# Patient Record
Sex: Female | Born: 1975 | Race: White | Hispanic: No | Marital: Married | Smoking: Never smoker
Health system: Southern US, Community
[De-identification: ages and names within clinical notes are randomized; demographics above are authoritative.]

## PROBLEM LIST (undated history)

## (undated) DIAGNOSIS — I341 Nonrheumatic mitral (valve) prolapse: Secondary | ICD-10-CM

## (undated) DIAGNOSIS — D6800 Von Willebrand disease, unspecified: Secondary | ICD-10-CM

## (undated) DIAGNOSIS — Z8679 Personal history of other diseases of the circulatory system: Secondary | ICD-10-CM

## (undated) DIAGNOSIS — J069 Acute upper respiratory infection, unspecified: Secondary | ICD-10-CM

## (undated) DIAGNOSIS — S8290XA Unspecified fracture of unspecified lower leg, initial encounter for closed fracture: Secondary | ICD-10-CM

## (undated) DIAGNOSIS — K219 Gastro-esophageal reflux disease without esophagitis: Secondary | ICD-10-CM

## (undated) HISTORY — DX: Unspecified fracture of unspecified lower leg, initial encounter for closed fracture: S82.90XA

## (undated) HISTORY — PX: HERNIA REPAIR: SHX51

## (undated) HISTORY — PX: KNEE ARTHROCENTESIS: SUR44

## (undated) HISTORY — PX: TONSILLECTOMY: SUR1361

## (undated) HISTORY — DX: Nonrheumatic mitral (valve) prolapse: I34.1

---

## 2001-05-06 HISTORY — PX: OTHER SURGICAL HISTORY: SHX169

## 2001-05-06 HISTORY — PX: HAND SURGERY: SHX662

## 2001-08-26 ENCOUNTER — Encounter: Payer: Self-pay | Admitting: Gastroenterology

## 2001-08-26 ENCOUNTER — Encounter: Admission: RE | Admit: 2001-08-26 | Discharge: 2001-08-26 | Payer: Self-pay | Admitting: Gastroenterology

## 2002-08-16 ENCOUNTER — Other Ambulatory Visit: Admission: RE | Admit: 2002-08-16 | Discharge: 2002-08-16 | Payer: Self-pay | Admitting: Obstetrics and Gynecology

## 2003-11-08 ENCOUNTER — Other Ambulatory Visit: Admission: RE | Admit: 2003-11-08 | Discharge: 2003-11-08 | Payer: Self-pay | Admitting: Obstetrics and Gynecology

## 2004-11-27 ENCOUNTER — Other Ambulatory Visit: Admission: RE | Admit: 2004-11-27 | Discharge: 2004-11-27 | Payer: Self-pay | Admitting: Obstetrics and Gynecology

## 2005-11-29 ENCOUNTER — Inpatient Hospital Stay (HOSPITAL_COMMUNITY): Admission: AD | Admit: 2005-11-29 | Discharge: 2005-11-29 | Payer: Self-pay | Admitting: Obstetrics and Gynecology

## 2005-12-20 ENCOUNTER — Inpatient Hospital Stay (HOSPITAL_COMMUNITY): Admission: AD | Admit: 2005-12-20 | Discharge: 2005-12-20 | Payer: Self-pay | Admitting: Obstetrics and Gynecology

## 2006-04-18 ENCOUNTER — Ambulatory Visit (HOSPITAL_COMMUNITY): Admission: RE | Admit: 2006-04-18 | Discharge: 2006-04-18 | Payer: Self-pay | Admitting: Obstetrics and Gynecology

## 2006-05-01 ENCOUNTER — Inpatient Hospital Stay (HOSPITAL_COMMUNITY): Admission: AD | Admit: 2006-05-01 | Discharge: 2006-05-02 | Payer: Self-pay | Admitting: Obstetrics and Gynecology

## 2006-05-01 ENCOUNTER — Ambulatory Visit: Payer: Self-pay | Admitting: Internal Medicine

## 2006-05-02 ENCOUNTER — Encounter (HOSPITAL_COMMUNITY): Payer: Self-pay | Admitting: Obstetrics and Gynecology

## 2006-05-02 ENCOUNTER — Encounter: Payer: Self-pay | Admitting: Cardiology

## 2006-05-07 ENCOUNTER — Ambulatory Visit: Payer: Self-pay | Admitting: Cardiology

## 2006-05-16 ENCOUNTER — Ambulatory Visit (HOSPITAL_COMMUNITY): Admission: RE | Admit: 2006-05-16 | Discharge: 2006-05-16 | Payer: Self-pay | Admitting: Obstetrics and Gynecology

## 2006-06-13 ENCOUNTER — Inpatient Hospital Stay (HOSPITAL_COMMUNITY): Admission: AD | Admit: 2006-06-13 | Discharge: 2006-06-13 | Payer: Self-pay | Admitting: Obstetrics and Gynecology

## 2006-06-22 ENCOUNTER — Inpatient Hospital Stay (HOSPITAL_COMMUNITY): Admission: AD | Admit: 2006-06-22 | Discharge: 2006-06-22 | Payer: Self-pay | Admitting: Obstetrics and Gynecology

## 2006-07-04 ENCOUNTER — Inpatient Hospital Stay (HOSPITAL_COMMUNITY): Admission: AD | Admit: 2006-07-04 | Discharge: 2006-07-09 | Payer: Self-pay | Admitting: Obstetrics and Gynecology

## 2006-07-17 ENCOUNTER — Ambulatory Visit: Admission: RE | Admit: 2006-07-17 | Discharge: 2006-07-17 | Payer: Self-pay | Admitting: Obstetrics and Gynecology

## 2006-08-07 ENCOUNTER — Ambulatory Visit: Payer: Self-pay | Admitting: Cardiology

## 2006-08-13 ENCOUNTER — Encounter: Payer: Self-pay | Admitting: Cardiology

## 2006-08-13 ENCOUNTER — Ambulatory Visit: Payer: Self-pay

## 2007-09-02 ENCOUNTER — Ambulatory Visit: Payer: Self-pay | Admitting: Cardiology

## 2008-04-19 ENCOUNTER — Ambulatory Visit: Payer: Self-pay | Admitting: Cardiology

## 2008-05-29 ENCOUNTER — Inpatient Hospital Stay (HOSPITAL_COMMUNITY): Admission: AD | Admit: 2008-05-29 | Discharge: 2008-05-29 | Payer: Self-pay | Admitting: Obstetrics and Gynecology

## 2008-06-01 ENCOUNTER — Inpatient Hospital Stay (HOSPITAL_COMMUNITY): Admission: AD | Admit: 2008-06-01 | Discharge: 2008-06-01 | Payer: Self-pay | Admitting: Obstetrics and Gynecology

## 2008-06-15 ENCOUNTER — Encounter: Payer: Self-pay | Admitting: Physician Assistant

## 2008-06-15 ENCOUNTER — Ambulatory Visit: Payer: Self-pay | Admitting: Cardiology

## 2008-06-15 DIAGNOSIS — I4891 Unspecified atrial fibrillation: Secondary | ICD-10-CM | POA: Insufficient documentation

## 2008-07-29 ENCOUNTER — Inpatient Hospital Stay (HOSPITAL_COMMUNITY): Admission: RE | Admit: 2008-07-29 | Discharge: 2008-07-31 | Payer: Self-pay | Admitting: Obstetrics and Gynecology

## 2009-05-09 ENCOUNTER — Ambulatory Visit: Payer: Self-pay | Admitting: Internal Medicine

## 2009-05-09 DIAGNOSIS — R51 Headache: Secondary | ICD-10-CM | POA: Insufficient documentation

## 2009-05-09 DIAGNOSIS — R5381 Other malaise: Secondary | ICD-10-CM | POA: Insufficient documentation

## 2009-05-09 DIAGNOSIS — R5383 Other fatigue: Secondary | ICD-10-CM | POA: Insufficient documentation

## 2009-05-09 DIAGNOSIS — D692 Other nonthrombocytopenic purpura: Secondary | ICD-10-CM | POA: Insufficient documentation

## 2009-05-09 DIAGNOSIS — R519 Headache, unspecified: Secondary | ICD-10-CM | POA: Insufficient documentation

## 2009-05-09 LAB — CONVERTED CEMR LAB
ALT: 14 units/L (ref 0–35)
Alkaline Phosphatase: 58 units/L (ref 39–117)
BUN: 15 mg/dL (ref 6–23)
Bilirubin Urine: NEGATIVE
CO2: 28 meq/L (ref 19–32)
Creatinine, Ser: 0.7 mg/dL (ref 0.4–1.2)
Eosinophils Absolute: 0 10*3/uL (ref 0.0–0.7)
Eosinophils Relative: 0.9 % (ref 0.0–5.0)
Folate: 17.7 ng/mL
GFR calc non Af Amer: 103.61 mL/min (ref >60)
Glucose, Bld: 76 mg/dL (ref 70–99)
Hemoglobin, Urine: NEGATIVE
INR: 1 (ref 0.8–1.0)
Leukocytes, UA: NEGATIVE
MCHC: 33.2 g/dL (ref 30.0–36.0)
Mono Screen: NEGATIVE
Monocytes Absolute: 0.4 10*3/uL (ref 0.1–1.0)
Monocytes Relative: 9.3 % (ref 3.0–12.0)
Nitrite: NEGATIVE
RDW: 12.3 % (ref 11.5–14.6)
Saturation Ratios: 28.6 % (ref 20.0–50.0)
Sodium: 140 meq/L (ref 135–145)
TSH: 1.09 microintl units/mL (ref 0.35–5.50)
Transferrin: 289.3 mg/dL (ref 212.0–360.0)
Urine Glucose: NEGATIVE mg/dL
Urobilinogen, UA: 0.2 (ref 0.0–1.0)
WBC: 4.4 10*3/uL — ABNORMAL LOW (ref 4.5–10.5)
aPTT: 38.3 s — ABNORMAL HIGH (ref 21.7–28.8)

## 2009-05-10 ENCOUNTER — Encounter: Payer: Self-pay | Admitting: Internal Medicine

## 2009-05-12 ENCOUNTER — Telehealth: Payer: Self-pay | Admitting: Internal Medicine

## 2009-05-22 ENCOUNTER — Ambulatory Visit: Payer: Self-pay | Admitting: Internal Medicine

## 2009-05-22 DIAGNOSIS — R791 Abnormal coagulation profile: Secondary | ICD-10-CM | POA: Insufficient documentation

## 2009-05-22 DIAGNOSIS — D892 Hypergammaglobulinemia, unspecified: Secondary | ICD-10-CM | POA: Insufficient documentation

## 2009-05-23 ENCOUNTER — Ambulatory Visit: Payer: Self-pay | Admitting: Hematology and Oncology

## 2009-05-25 ENCOUNTER — Encounter: Payer: Self-pay | Admitting: Internal Medicine

## 2009-05-25 LAB — CBC WITH DIFFERENTIAL/PLATELET
Basophils Absolute: 0 10*3/uL (ref 0.0–0.1)
Eosinophils Absolute: 0 10*3/uL (ref 0.0–0.5)
MCHC: 34.1 g/dL (ref 31.5–36.0)
RBC: 4.76 10*6/uL (ref 3.70–5.45)
nRBC: 0 % (ref 0–0)

## 2009-05-25 LAB — HCG, QUANTITATIVE, PREGNANCY: hCG, Beta Chain, Quant, S: <2 m[IU]/mL

## 2009-05-25 LAB — IVY BLEEDING TIME: Bleeding Time: 10.5 Minutes — ABNORMAL HIGH (ref 2.0–8.0)

## 2009-05-29 LAB — COMPREHENSIVE METABOLIC PANEL
Alkaline Phosphatase: 52 U/L (ref 39–117)
BUN: 18 mg/dL (ref 6–23)
CO2: 23 mEq/L (ref 19–32)
Chloride: 106 mEq/L (ref 96–112)
Creatinine, Ser: 0.7 mg/dL (ref 0.40–1.20)
Potassium: 3.9 mEq/L (ref 3.5–5.3)
Sodium: 141 mEq/L (ref 135–145)
Total Protein: 7.9 g/dL (ref 6.0–8.3)

## 2009-05-29 LAB — MIXING STUDY DILUTIONS, PTT
Patient 1/1 Immediate Mix: 36 seconds
Patient 4/1 Immediate Mix: 43 seconds

## 2009-05-29 LAB — PROTHROMBIN TIME
INR: 1.23 (ref <1.50)
Prothrombin Time: 15.4 seconds — ABNORMAL HIGH (ref 11.6–15.2)

## 2009-05-29 LAB — THROMBIN TIME: THROMBIN TIME: 20 s (ref 16–23)

## 2009-05-30 LAB — VON WILLEBRAND FACTOR MULTIMER
Factor-VIII Activity: 43 % — ABNORMAL LOW (ref 50–180)
Ristocetin Co-Factor: 43 % (ref 42–200)
Von Willebrand Factor Ag: 50 % (ref 50–217)

## 2009-06-08 ENCOUNTER — Ambulatory Visit: Payer: Self-pay | Admitting: Internal Medicine

## 2009-06-08 DIAGNOSIS — H66009 Acute suppurative otitis media without spontaneous rupture of ear drum, unspecified ear: Secondary | ICD-10-CM | POA: Insufficient documentation

## 2009-06-08 DIAGNOSIS — K5289 Other specified noninfective gastroenteritis and colitis: Secondary | ICD-10-CM | POA: Insufficient documentation

## 2009-06-09 ENCOUNTER — Encounter: Payer: Self-pay | Admitting: Internal Medicine

## 2009-11-20 ENCOUNTER — Telehealth: Payer: Self-pay | Admitting: Internal Medicine

## 2009-11-20 DIAGNOSIS — L272 Dermatitis due to ingested food: Secondary | ICD-10-CM | POA: Insufficient documentation

## 2009-12-04 ENCOUNTER — Encounter: Payer: Self-pay | Admitting: Internal Medicine

## 2010-04-11 ENCOUNTER — Telehealth: Payer: Self-pay | Admitting: Internal Medicine

## 2010-04-11 ENCOUNTER — Ambulatory Visit: Payer: Self-pay | Admitting: Internal Medicine

## 2010-04-11 DIAGNOSIS — R05 Cough: Secondary | ICD-10-CM

## 2010-04-11 DIAGNOSIS — R059 Cough, unspecified: Secondary | ICD-10-CM | POA: Insufficient documentation

## 2010-04-18 ENCOUNTER — Ambulatory Visit: Payer: Self-pay | Admitting: Internal Medicine

## 2010-06-05 NOTE — Assessment & Plan Note (Signed)
Summary: CHEST CONGEST--COUGH  --STC   Vital Signs:  Patient profile:   35 year old female Menstrual status:  regular LMP:     04/06/2010 Height:      61 inches Weight:      113.50 pounds BMI:     21.52 O2 Sat:      88 % on Room air Temp:     98.4 degrees F oral Pulse rate:   102 / minute Pulse rhythm:   regular Resp:     16 per minute BP sitting:   110 / 68  (left arm) Cuff size:   regular  Vitals Entered By: Rock Nephew CMA (April 11, 2010 10:05 AM)  O2 Flow:  Room air CC: Patient c/o cough, congestion, SOB, headache x 1wk, URI symptoms Is Patient Diabetic? No Pain Assessment Patient in pain? no       Does patient need assistance? Functional Status Self care Ambulation Normal LMP (date): 04/06/2010     Menstrual Status regular Enter LMP: 04/06/2010 Last PAP Result Normal   Primary Care Provider:  Etta Grandchild MD  CC:  Patient c/o cough, congestion, SOB, headache x 1wk, and URI symptoms.  History of Present Illness:  URI Symptoms      This is a 35 year old woman who presents with URI symptoms.  The symptoms began 1 week ago.  The severity is described as moderate.  The patient reports nasal congestion, sore throat, productive cough, and sick contacts, but denies clear nasal discharge, purulent nasal discharge, and earache.  Associated symptoms include dyspnea.  The patient denies fever, stiff neck, wheezing, rash, vomiting, diarrhea, use of an antipyretic, and response to antipyretic.  The patient also reports muscle aches and severe fatigue.  The patient denies itchy throat, sneezing, seasonal symptoms, and headache.  Risk factors for Strep sinusitis include double sickening.  The patient denies the following risk factors for Strep sinusitis: unilateral facial pain, unilateral nasal discharge, poor response to decongestant, tooth pain, Strep exposure, tender adenopathy, and absence of cough.    Preventive Screening-Counseling &  Management  Alcohol-Tobacco     Alcohol drinks/day: 0     Alcohol Counseling: not indicated; patient does not drink     Smoking Status: never     Tobacco Counseling: not indicated; no tobacco use  Hep-HIV-STD-Contraception     Hepatitis Risk: no risk noted     HIV Risk: no risk noted     STD Risk: no risk noted      Sexual History:  currently monogamous.        Drug Use:  never.        Blood Transfusions:  no.    Medications Prior to Update: 1)  Prenatal Vitamins .... Take As Directed 2)  Calcium 1500 Mg Tabs (Calcium Carbonate) .... Take 1 Tablet By Mouth Once A Day 3)  Concept Dha 53.5-38-1 Mg Caps (Prenat-Fefum-Fepo-Fa-Omega 3) 4)  Ortho-Micro (28) 5)  Promethazine Hcl 25 Mg Tabs (Promethazine Hcl) .... 1/2-1 By Mouth Three Times A Day As Needed For Nausea/vomiting 6)  Lortab 5 5-500 Mg Tabs (Hydrocodone-Acetaminophen) .... One By Mouth Three Times A Day As Needed For Pain  Current Medications (verified): 1)  Calcium 1500 Mg Tabs (Calcium Carbonate) .... Take 1 Tablet By Mouth Once A Day 2)  Multivitamins  Tabs (Multiple Vitamin) .... Take 1 Tablet By Mouth Once A Day 3)  Avelox 400 Mg Tabs (Moxifloxacin Hcl) .... One By Mouth Once Daily For 7 Days 4)  Mytussin Ac 100-10 Mg/13ml Syrp (Guaifenesin-Codeine) .... 5-10 Ml By Mouth Qid As Needed For Cough  Allergies (verified): 1)  ! Compazine 2)  ! 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  Alcohol Use - no Regular exercise-yes  Risk Factors: Alcohol Use: 0  (04/11/2010) Exercise: yes (05/09/2009)  Risk Factors: Smoking Status: never (04/11/2010)  Family History: Reviewed history from 06/15/2008 and no changes required. Father:HTN, had cath that was normal Mother:Palpitations  Social History: Reviewed history from 05/09/2009 and no changes required. Full Time Mom Married  Tobacco Use - No.  Alcohol Use - no Regular exercise-yes  Review of Systems  The patient denies anorexia, fever, weight loss, weight gain, hoarseness, chest pain, syncope, dyspnea on exertion, peripheral edema, hemoptysis, abdominal pain, suspicious skin lesions, and enlarged lymph nodes.   CV:  Denies chest pain or discomfort, fainting, fatigue, leg cramps with exertion, lightheadness, near fainting, palpitations, shortness of breath with exertion, swelling of feet, and weight gain.  Physical Exam  General:  alert, well-nourished, well-hydrated, appropriate dress, healthy-appearing, cooperative to examination, and disheveled.   Head:  normocephalic, atraumatic, no abnormalities observed, and no abnormalities palpated.   Eyes:  vision grossly intact, pupils equal, and no injection.   Ears:  R ear normal and L ear normal.   Nose:  no external deformity, no nasal discharge, no airflow obstruction, no intranasal foreign body, no nasal polyps, no nasal mucosal lesions, no mucosal friability, no active bleeding or clots, no sinus percussion tenderness, no septum abnormalities, mucosal erythema, and mucosal edema.   Mouth:  good dentition, no exudates, no posterior lymphoid hypertrophy, no postnasal drip, no pharyngeal crowing, no lesions, no aphthous ulcers, no erosions, no tongue abnormalities, no leukoplakia, no petechiae, and pharyngeal erythema.   Neck:  supple, full ROM, no masses, no thyromegaly, no thyroid nodules or tenderness, normal carotid upstroke, no carotid bruits, no cervical lymphadenopathy, and no neck tenderness.   Lungs:  normal respiratory effort, no  intercostal retractions, no accessory muscle use, no dullness, no fremitus, no crackles, and no wheezes.   Heart:  Normal rate and regular rhythm. S1 and S2 normal without gallop, murmur, click, rub or other extra sounds. Abdomen:  soft, non-tender, normal bowel sounds, no distention, no masses, no guarding, no rigidity, no rebound tenderness, and no abdominal hernia.   Msk:  No deformity or scoliosis noted of thoracic or lumbar spine.   Pulses:  R and L carotid,radial,femoral,dorsalis pedis and posterior tibial pulses are full and equal bilaterally Extremities:  No clubbing, cyanosis, edema, or deformity noted with normal full range of motion of all joints.   Neurologic:  No cranial nerve deficits noted. Station and gait are normal. Plantar reflexes are down-going bilaterally. DTRs are symmetrical throughout. Sensory, motor and coordinative functions appear intact. Skin:  Intact without suspicious lesions or rashes Cervical Nodes:  no anterior cervical adenopathy and no posterior cervical adenopathy.   Axillary Nodes:  no R axillary adenopathy and no L axillary adenopathy.   Psych:  Cognition and judgment appear intact. Alert and cooperative with normal attention span and concentration. No apparent delusions, illusions, hallucinations   Impression &  Recommendations:  Problem # 1:  COUGH (ICD-786.2) Assessment New will check for pna Orders: T-2 View CXR (71020TC)  Problem # 2:  BRONCHITIS-ACUTE (ICD-466.0) Assessment: New  Her updated medication list for this problem includes:    Avelox 400 Mg Tabs (Moxifloxacin hcl) ..... One by mouth once daily for 7 days    Mytussin Ac 100-10 Mg/9ml Syrp (Guaifenesin-codeine) .Marland Kitchen... 5-10 ml by mouth qid as needed for cough  Problem # 3:  ATRIAL FIBRILLATION (ICD-427.31) Assessment: Improved  Complete Medication List: 1)  Calcium 1500 Mg Tabs (Calcium carbonate) .... Take 1 tablet by mouth once a day 2)  Multivitamins Tabs (Multiple vitamin) ....  Take 1 tablet by mouth once a day 3)  Avelox 400 Mg Tabs (Moxifloxacin hcl) .... One by mouth once daily for 7 days 4)  Mytussin Ac 100-10 Mg/16ml Syrp (Guaifenesin-codeine) .... 5-10 ml by mouth qid as needed for cough  Patient Instructions: 1)  Please schedule a follow-up appointment in 1-2 weeks. 2)  Take your antibiotic as prescribed until ALL of it is gone, but stop if you develop a rash or swelling and contact our office as soon as possible. 3)  Acute bronchitis symptoms for less than 10 days are not helped by antibiotics. take over the counter cough medications. call if no improvment in  5-7 days, sooner if increasing cough, fever, or new symptoms( shortness of breath, chest pain). Prescriptions: MYTUSSIN AC 100-10 MG/5ML SYRP (GUAIFENESIN-CODEINE) 5-10 ml by mouth qid as needed FOR COUGH  #8 OUNCES x 1   Entered and Authorized by:   Etta Grandchild MD   Signed by:   Etta Grandchild MD on 04/11/2010   Method used:   Print then Give to Patient   RxID:   [id number] AVELOX 400 MG TABS (MOXIFLOXACIN HCL) One by mouth once daily for 7 days  #7 x 0   Entered and Authorized by:   Etta Grandchild MD   Signed by:   Etta Grandchild MD on 04/11/2010   Method used:   Samples Given   RxID:   [id number]    Orders Added: 1)  T-2 View CXR [71020TC] 2)  Est. Patient Level IV [47425]

## 2010-06-05 NOTE — Assessment & Plan Note (Signed)
Summary: 2 wk f/u / #/ cd   Vital Signs:  Patient profile:   35 year old female Menstrual status:  irregular Height:      61 inches Weight:      117 pounds BMI:     22.19 O2 Sat:      98 % on Room air Temp:     97.3 degrees F oral Pulse rate:   67 / minute Pulse rhythm:   regular Resp:     16 per minute BP sitting:   112 / 66  (left arm) Cuff size:   large  Vitals Entered By: Rock Nephew CMA (May 22, 2009 2:09 PM)  O2 Flow:  Room air CC: follow-up visit Is Patient Diabetic? No Pain Assessment Patient in pain? no        Primary Care Provider:  Etta Grandchild MD  CC:  follow-up visit.  History of Present Illness: she returns for f/up and is concerned about abnormal labs. she still reports "bruising all the time" but does not have any bruising to show me today. she now reoprts that she did a bleeding time 10 years ago  b/c she was vomiting blood and she has a hx. of nosebleeds. also, she had a recent c-section and had delayed vaginal bleeding.  Preventive Screening-Counseling & Management  Alcohol-Tobacco     Alcohol drinks/day: 0     Smoking Status: never  Clinical Review Panels:  Diabetes Management   Creatinine:  0.7 (05/09/2009)  CBC   WBC:  4.4 (05/09/2009)   RBC:  4.89 (05/09/2009)   Hgb:  14.6 (05/09/2009)   Hct:  44.1 (05/09/2009)   Platelets:  204.0 (05/09/2009)   MCV  90.2 (05/09/2009)   MCHC  33.2 (05/09/2009)   RDW  12.3 (05/09/2009)   PMN:  45.9 (05/09/2009)   Lymphs:  43.4 (05/09/2009)   Monos:  9.3 (05/09/2009)   Eosinophils:  0.9 (05/09/2009)   Basophil:  0.5 (05/09/2009)  Complete Metabolic Panel   Glucose:  76 (05/09/2009)   Sodium:  140 (05/09/2009)   Potassium:  4.6 (05/09/2009)   Chloride:  103 (05/09/2009)   CO2:  28 (05/09/2009)   BUN:  15 (05/09/2009)   Creatinine:  0.7 (05/09/2009)   Albumin:  4.6 (05/09/2009)   Total Protein:  8.8 (05/09/2009)   Calcium:  10.4 (05/09/2009)   Total Bili:  2.7 (05/09/2009)   Alk  Phos:  58 (05/09/2009)   SGPT (ALT):  14 (05/09/2009)   SGOT (AST):  18 (05/09/2009)   Medications Prior to Update: 1)  Prenatal Vitamins .... Take As Directed 2)  Calcium 1500 Mg Tabs (Calcium Carbonate) .... Take 1 Tablet By Mouth Once A Day 3)  Concept Dha 53.5-38-1 Mg Caps (Prenat-Fefum-Fepo-Fa-Omega 3) 4)  Ortho-Micro (28)  Current Medications (verified): 1)  Prenatal Vitamins .... Take As Directed 2)  Calcium 1500 Mg Tabs (Calcium Carbonate) .... Take 1 Tablet By Mouth Once A Day 3)  Concept Dha 53.5-38-1 Mg Caps (Prenat-Fefum-Fepo-Fa-Omega 3) 4)  Ortho-Micro (28)  Allergies (verified): 1)  ! Compazine 2)  ! Sulfa  Past History:  Past Medical History: Reviewed history from 05/09/2009 and no changes required. 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 Tonsillectomy  Family History: Reviewed history from 06/15/2008 and no changes required. Father:HTN, had cath that was normal Mother:Palpitations  Social History: Reviewed  history from 05/09/2009 and no changes required. Full Time Mom Married  Tobacco Use - No.  Alcohol Use - no Regular exercise-yes  Review of Systems  The patient denies anorexia, fever, weight loss, chest pain, hemoptysis, abdominal pain, abnormal bleeding, enlarged lymph nodes, and angioedema.   General:  Denies chills, fatigue, fever, loss of appetite, malaise, sleep disorder, sweats, weakness, and weight loss. Heme:  Complains of abnormal bruising; denies bleeding, enlarge lymph nodes, fevers, pallor, and skin discoloration.  Physical Exam  General:  Well-developed,well-nourished,in no acute distress; alert,appropriate and cooperative throughout examination Head:  Normocephalic and atraumatic without obvious abnormalities. No  apparent alopecia or balding. Mouth:  Oral mucosa and oropharynx without lesions or exudates.  Teeth in good repair. Neck:  No deformities, masses, or tenderness noted. Lungs:  Normal respiratory effort, chest expands symmetrically. Lungs are clear to auscultation, no crackles or wheezes. Heart:  Normal rate and regular rhythm. S1 and S2 normal without gallop, murmur, click, rub or other extra sounds. Abdomen:  Bowel sounds positive,abdomen soft and non-tender without masses, organomegaly or hernias noted. Msk:  No deformity or scoliosis noted of thoracic or lumbar spine.   Pulses:  R and L carotid,radial,femoral,dorsalis pedis and posterior tibial pulses are full and equal bilaterally Extremities:  No clubbing, cyanosis, edema, or deformity noted with normal full range of motion of all joints.   Neurologic:  No cranial nerve deficits noted. Station and gait are normal. Plantar reflexes are down-going bilaterally. DTRs are symmetrical throughout. Sensory, motor and coordinative functions appear intact. Skin:  turgor normal, color normal, no rashes, no suspicious lesions, no ecchymoses, no petechiae, no purpura, no ulcerations, and no edema.   Cervical Nodes:  no anterior cervical adenopathy and no posterior cervical adenopathy.   Axillary Nodes:  no R axillary adenopathy and no L axillary adenopathy.   Inguinal Nodes:  no R inguinal adenopathy and no L inguinal adenopathy.   Psych:  Cognition and judgment appear intact. Alert and cooperative with normal attention span and concentration. 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 Take  as directed 2)  Calcium 1500 Mg Tabs (Calcium carbonate) .... Take 1 tablet by mouth once a day 3)  Concept Dha 53.5-38-1 Mg Caps (Prenat-fefum-fepo-fa-omega 3) 4)  Ortho-micro (28)   Patient Instructions: 1)  Please schedule a follow-up appointment in 2 months.

## 2010-06-05 NOTE — Progress Notes (Signed)
Summary: RESULTS  Phone Note Call from Patient Call back at Home Phone [phone]   Summary of Call: Patient is requesting results of xray.  Initial call taken by: Lamar Sprinkles, CMA,  April 11, 2010 3:23 PM  Follow-up for Phone Call        normal Follow-up by: Etta Grandchild MD,  April 11, 2010 3:27 PM  Additional Follow-up for Phone Call Additional follow up Details #1::        Pt informed  Additional Follow-up by: Lamar Sprinkles, CMA,  April 11, 2010 5:41 PM

## 2010-06-05 NOTE — Assessment & Plan Note (Signed)
Summary: NEW PT-ETNA NAP-#PKG-LB   Vital Signs:  Patient profile:   35 year old female Menstrual status:  irregular LMP:     10/24/2007 Height:      61 inches Weight:      117 pounds BMI:     22.19 O2 Sat:      90 % on Room air Temp:     98.0 degrees F oral Pulse rate:   61 / minute Pulse rhythm:   regular Resp:     16 per minute BP sitting:   106 / 70  (left arm) Cuff size:   large  Vitals Entered By: Rock Nephew CMA (May 09, 2009 10:19 AM)  O2 Flow:  Room air CC: new pt cpx, Preventive Care Is Patient Diabetic? No Pain Assessment Patient in pain? no      LMP (date): 10/24/2007     Menstrual Status irregular Enter LMP: 10/24/2007 Last PAP Result Normal   Primary Care Provider:  Etta Grandchild MD  CC:  new pt cpx and Preventive Care.  History of Present Illness: New to me she c/o's several month hx. of dizziness, diffuse tingling/numbness, fatigue, easy bruising, and frequent sore throat and "congestion".  Preventive Screening-Counseling & Management  Alcohol-Tobacco     Alcohol drinks/day: 0     Smoking Status: never  Caffeine-Diet-Exercise     Does Patient Exercise: yes  Hep-HIV-STD-Contraception     Hepatitis Risk: no risk noted     HIV Risk: no risk noted     STD Risk: no risk noted      Sexual History:  currently monogamous.        Drug Use:  never.        Blood Transfusions:  no.    Current Medications (verified): 1)  Prenatal Vitamins .... Take As Directed 2)  Calcium 1500 Mg Tabs (Calcium Carbonate) .... Take 1 Tablet By Mouth Once A Day 3)  Concept Dha 53.5-38-1 Mg Caps (Prenat-Fefum-Fepo-Fa-Omega 3) 4)  Ortho-Micro (28)  Allergies (verified): 1)  ! Compazine 2)  ! 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 Tonsillectomy  Family History: Reviewed history from 06/15/2008 and no changes required. Father:HTN, had cath that was normal Mother:Palpitations  Social History: Reviewed history from 06/15/2008 and no changes required. Full Time Mom Married  Tobacco Use - No.  Alcohol Use - no Regular exercise-yes Hepatitis Risk:  no risk noted HIV Risk:  no risk noted STD Risk:  no risk noted Sexual History:  currently monogamous Drug Use:  never Blood Transfusions:  no Does Patient Exercise:  yes  Review of Systems  The patient denies anorexia, fever, weight loss, weight gain, chest pain, peripheral edema, prolonged cough, hemoptysis, abdominal pain, hematuria, difficulty walking, depression, abnormal bleeding, enlarged lymph nodes, and angioedema.   General:  Complains of fatigue; denies chills, fever, loss of appetite, malaise, sleep disorder, sweats, weakness, and weight loss. ENT:  Complains of nasal congestion and sore throat; denies decreased hearing, difficulty swallowing, earache, nosebleeds, postnasal drainage, ringing in ears, and sinus pressure. CV:  Denies chest pain or discomfort, difficulty breathing at night, fainting, near fainting, palpitations, shortness of breath with exertion, swelling of feet, and weight gain. Psych:  Denies anxiety, depression, easily angered, easily tearful, irritability, mental problems, panic attacks, sense  of great danger, and suicidal thoughts/plans. Heme:  Complains of abnormal bruising; denies bleeding, enlarge lymph nodes, fevers, pallor, and skin discoloration.  Physical Exam  General:  Well-developed,well-nourished,in no acute distress; alert,appropriate and cooperative throughout examination Head:  Normocephalic and atraumatic without obvious abnormalities. No apparent alopecia or balding. Eyes:  No corneal or conjunctival inflammation noted. EOMI. Perrla. Funduscopic exam benign, without hemorrhages,  exudates or papilledema. Vision grossly normal. Ears:  R ear normal and L ear normal.   Mouth:  Oral mucosa and oropharynx without lesions or exudates.  Teeth in good repair. Neck:  No deformities, masses, or tenderness noted. Lungs:  Normal respiratory effort, chest expands symmetrically. Lungs are clear to auscultation, no crackles or wheezes. Heart:  Normal rate and regular rhythm. S1 and S2 normal without gallop, murmur, click, rub or other extra sounds. Abdomen:  Bowel sounds positive,abdomen soft and non-tender without masses, organomegaly or hernias noted. Msk:  No deformity or scoliosis noted of thoracic or lumbar spine.   Pulses:  R and L carotid,radial,femoral,dorsalis pedis and posterior tibial pulses are full and equal bilaterally Extremities:  No clubbing, cyanosis, edema, or deformity noted with normal full range of motion of all joints.   Neurologic:  No cranial nerve deficits noted. Station and gait are normal. Plantar reflexes are down-going bilaterally. DTRs are symmetrical throughout. Sensory, motor and coordinative functions appear intact. Skin:  turgor normal, color normal, no rashes, no suspicious lesions, no ecchymoses, no petechiae, no purpura, no ulcerations, and no edema.   Cervical Nodes:  no anterior cervical adenopathy and no posterior cervical adenopathy.   Axillary Nodes:  no R axillary adenopathy and no L axillary adenopathy.   Inguinal Nodes:  no R inguinal adenopathy and no L inguinal adenopathy.   Psych:  Cognition and judgment appear intact. Alert and cooperative with normal attention span and concentration. 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) Assessment New  Orders: Venipuncture (45409) TLB-B12 + Folate Pnl (81191_47829-F62/ZHY) TLB-IBC Pnl (Iron/FE;Transferrin) (83550-IBC) TLB-BMP (Basic Metabolic Panel-BMET) (80048-METABOL) TLB-CBC Platelet -  w/Differential (85025-CBCD) TLB-Hepatic/Liver Function Pnl (80076-HEPATIC) TLB-TSH (Thyroid Stimulating Hormone) (84443-TSH) TLB-Udip w/ Micro (81001-URINE) TLB-Mono Test (Monospot) (86308-MONO) TLB-PT (Protime) (85610-PTP) TLB-PTT (85730-PTTL)  Problem # 2:  FATIGUE, ACUTE (ICD-780.79) Assessment: New  Orders: Venipuncture (86578) TLB-B12 + Folate Pnl ([id number]-X32/GMW) TLB-IBC Pnl (Iron/FE;Transferrin) (83550-IBC) TLB-BMP (Basic Metabolic Panel-BMET) (80048-METABOL) TLB-CBC Platelet - w/Differential (85025-CBCD) TLB-Hepatic/Liver Function Pnl (80076-HEPATIC) TLB-TSH (Thyroid Stimulating Hormone) (84443-TSH) TLB-Udip w/ Micro (81001-URINE) TLB-Mono Test (Monospot) (86308-MONO) TLB-PT (Protime) (85610-PTP) TLB-PTT (85730-PTTL)  Problem # 3:  ATRIAL FIBRILLATION (ICD-427.31) Assessment: Improved  The following medications were removed from the medication list:    Labetalol Hcl 100 Mg Tabs (Labetalol hcl) .Marland Kitchen... As needed  Orders: Venipuncture (10272) TLB-B12 + Folate Pnl ([id number]-Q25/ZDG) TLB-IBC Pnl (Iron/FE;Transferrin) (83550-IBC) TLB-BMP (Basic Metabolic Panel-BMET) (80048-METABOL) TLB-CBC Platelet - w/Differential (85025-CBCD) TLB-Hepatic/Liver Function Pnl (80076-HEPATIC) TLB-TSH (Thyroid Stimulating Hormone) (84443-TSH) TLB-Udip w/ Micro (81001-URINE) TLB-Mono Test (Monospot) (86308-MONO) TLB-PT (Protime) (85610-PTP) TLB-PTT (85730-PTTL)  Complete Medication List: 1)  Prenatal Vitamins  .... Take as directed 2)  Calcium 1500 Mg Tabs (Calcium carbonate) .... Take 1 tablet by mouth once a day 3)  Concept Dha 53.5-38-1 Mg Caps (Prenat-fefum-fepo-fa-omega 3) 4)  Ortho-micro (28)   PAP Screening:    Hx Cervical Dysplasia in last 5 yrs? Yes    3 normal PAP smears in last 5 yrs? Yes    Last PAP smear:  09/29/2008  PAP Smear Results:  Date of Exam:  09/29/2008    Results:  Normal  Osteoporosis Risk Assessment:  Risk Factors for Fracture or Low Bone  Density:   Race (White or Asian):     yes   Smoking status:       never  Immunization & Chemoprophylaxis:    Tetanus vaccine: Tdap  (05/06/2008)  Patient Instructions: 1)  Please schedule a follow-up appointment in 2 weeks.  Preventive Care Screening  Last Tetanus Booster:    Date:  05/06/2008    Results:  Tdap

## 2010-06-05 NOTE — Letter (Signed)
[facility]   Imported By: Lester  06/22/2009 07:42:00  _____________________________________________________________________  External Attachment:    Type:   Image     Comment:   External Document
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
## 2010-06-05 NOTE — Progress Notes (Signed)
Summary: REFERRAL  Phone Note Call from Patient Call back at Home Phone [phone]   Summary of Call: Pt c/o allergic reaction to "something" she ate last week. Patient is requesting referral to allergist for eval.  Initial call taken by: Lamar Sprinkles, CMA,  November 20, 2009 3:13 PM  New Problems: ALLERGY, FOOD (ICD-693.1)   New Problems: ALLERGY, FOOD (ICD-693.1)

## 2010-06-05 NOTE — Progress Notes (Signed)
Summary: CALL  Phone Note Call from Patient   Summary of Call: Patient is requesting a call back regarding the last labs.  Initial call taken by: Lamar Sprinkles, CMA,  May 12, 2009 9:18 AM  Follow-up for Phone Call        Pt scheduled for f/u to discuss labs Follow-up by: Lamar Sprinkles, CMA,  May 12, 2009 4:38 PM

## 2010-06-05 NOTE — Letter (Signed)
[facility]   Imported By: Lester [place] 06/13/2009 09:44:05  _____________________________________________________________________  External Attachment:    Type:   Image     Comment:   External Document

## 2010-06-05 NOTE — Letter (Signed)
Summary: Results Follow-up Letter  [facility]  [address] [place], [place] [address]   Phone: [phone]  Fax: [phone]    05/10/2009  [address] [place], [place]  [address]  Dear Ms. Brzozowski,   The following are the results of your recent test(s):  Test     Result     CBC       normal Liver/kidney   normal Bleeding test   abnormal Protein level    abnormally high Thyroid     normal B12       normal Iron       normal   _________________________________________________________  Please call for an appointment in 2 weeks, I want to discuss sending you to a blood specialist _________________________________________________________ _________________________________________________________ _________________________________________________________  Sincerely,  Sanda Linger MD [facility]

## 2010-06-05 NOTE — Consult Note (Signed)
[facility]   Imported By: Lester [place] 04/06/2010 09:52:13  _____________________________________________________________________  External Attachment:    Type:   Image     Comment:   External Document
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
## 2010-08-03 LAB — ANTIBODY SCREEN: Antibody Screen: NEGATIVE

## 2010-08-03 LAB — RUBELLA ANTIBODY, IGM: Rubella: IMMUNE

## 2010-08-03 LAB — HIV ANTIBODY (ROUTINE TESTING W REFLEX): HIV: NONREACTIVE

## 2010-08-16 LAB — CBC
MCHC: 33.5 g/dL (ref 30.0–36.0)
RBC: 4.07 MIL/uL (ref 3.87–5.11)
RDW: 13.5 % (ref 11.5–15.5)
WBC: 7.7 10*3/uL (ref 4.0–10.5)
WBC: 9.6 10*3/uL (ref 4.0–10.5)

## 2010-08-21 DIAGNOSIS — O368190 Decreased fetal movements, unspecified trimester, not applicable or unspecified: Secondary | ICD-10-CM

## 2010-09-04 ENCOUNTER — Other Ambulatory Visit (HOSPITAL_COMMUNITY): Payer: Self-pay | Admitting: Obstetrics and Gynecology

## 2010-09-04 DIAGNOSIS — R772 Abnormality of alphafetoprotein: Secondary | ICD-10-CM

## 2010-09-04 DIAGNOSIS — Z0489 Encounter for examination and observation for other specified reasons: Secondary | ICD-10-CM

## 2010-09-04 DIAGNOSIS — IMO0002 Reserved for concepts with insufficient information to code with codable children: Secondary | ICD-10-CM

## 2010-09-18 NOTE — Assessment & Plan Note (Signed)
 HEALTHCARE                            CARDIOLOGY OFFICE NOTE   NAME:Matthews, Danielle S                         MRN:          [id number]  DATE:04/19/2008                            DOB:          08/23/1975    [name] returns today with recurrent episodes of atrial fib.  They  are happening around a couple of times a day at times.  She is now 5-1/2  months pregnant.  She has a history of pregnancy-associated atrial fib  with a rapid ventricular rate.   She has not had any episodes greater than 45 minutes.  She does not want  to take any medications for fear of hurting the baby.  She says she will  wait another 4 weeks and if it gets worse, she might consider taking  something.  Last time she was treated with digoxin and diltiazem.   She has mitral valve prolapse.  An echo was repeated again today.  Preliminary is normal except for the minimal prolapse.  EF 65% or  greater.   She is on calcium, Zantac, and prenatal vitamins.   PHYSICAL EXAMINATION:  VITAL SIGNS:  Her blood pressure today is 98/64,  pulse 80 and regular, her weight is 140.  Her EKG shows sinus rhythm.  Normal PR, QRS, and QTC interval.  HEENT:  Normal.  NECK:  Carotids upstrokes were equal and bounding.  Thyroid is not  enlarged.  Trachea is midline.  LUNGS:  Clear to auscultation and percussion.  HEART:  Nondisplaced PMI.  Normal S1 and S2.  No click was heard.  ABDOMEN:  Gravid.  I did not palpate.  EXTREMITIES:  No cyanosis, clubbing, or edema.  Pulses are intact.  NEUROLOGIC:  Intact.   [name] is having recurrent episodes of clinical atrial fib.  She  does not want to take any medications at present.  She has promised to  call me in several weeks if this gets worse or continues.  We could  serve her with low-dose digoxin.  She would like to stay off things  altogether.   She also notes she has persistent atrial fib reported in the emergency  room.  She says she will not  come to [facility].  She will go to [place].   I will plan on seeing her after her delivery in 6 months.    Thomas C. Daleen Squibb, MD, [facility]  Electronically Signed   TCW/MedQ  DD: 04/19/2008  DT: 04/20/2008  Job #: [id number]   cc:   Freddy Finner, M.D.

## 2010-09-18 NOTE — Op Note (Signed)
[name], [name]                  ACCOUNT NO.:  [id number]   MEDICAL RECORD NO.:  [id number]          PATIENT TYPE:  INP   LOCATION:  9114                          FACILITY:  [facility]   PHYSICIAN:  Juluis Mire, M.D.   DATE OF BIRTH:  Nov 21, 1975   DATE OF PROCEDURE:  07/29/2008  DATE OF DISCHARGE:                               OPERATIVE REPORT   PREOPERATIVE DIAGNOSES:  1. Intrauterine pregnancy at term with prior cesarean section, desires      repeat.  2. Umbilical hernia.   POSTOPERATIVE DIAGNOSES:  1. Intrauterine pregnancy at term with prior cesarean section, desires      repeat.  2. Umbilical hernia.   PROCEDURE:  Repeat low transverse cesarean section with repair of  umbilical hernia.   SURGEON:  Juluis Mire, MD   ANESTHESIA:  Spinal.   ESTIMATED BLOOD LOSS:  5-600 mL.   PACK AND DRAINS:  None.   INTRAOPERATIVE BLOOD PLACED:  None.   COMPLICATIONS:  None.   INDICATIONS:  As dictated in history and physical with the addition of  the patient has had an umbilical hernia, evaluated by a general surgeon.  They stated that they could not fix the time of C-section.  We are going  to attempt to put some permanent suture to see if this will help.   PROCEDURE:  The patient was taken to the OR, placed in supine position  with left lateral tilt.  After satisfactory level of spinal anesthesia  was obtained, the abdomen was prepped out Betadine and draped in sterile  field.  A prior low transverse incision was excised and incision was  extended through subcutaneous tissue.  The fascia was entered sharply  and incision in the fascia was extended laterally.  The fascia taken off  the muscles superiorly and inferiorly.  Rectus muscles were separated in  the midline.  Anterior peritoneum was entered sharply.  Incision of  peritoneum extended both superiorly and inferiorly.  Low transverse  bladder flap was developed, low transverse uterine incision begun with a  knife and  extended laterally using manual traction.  The infant was  presented in vertex presentation, delivered with the use of the vacuum  extractor and fundal pressure.  The infant was a viable female, weighing 8  pounds, Apgars were 9 and 9.  Placenta was extracted.  The uterus was  exteriorized for closure.  The uterus was closed with a running locking  suture of 0 chromic using 2-layer closure technique.  Some areas of  bleeding brought controlled with figure-of-eights of 0 Vicryl.  We then  identified the umbilical hernia and placed 2 sutures of 2-0 Prolene.  They did not appear any puckering of the skin and we did get closure of  the hernia whether this will maintain, it is unsure.   At this point, the uterus was returned to the abdominal cavity.  We  irrigated the pelvis, had good hemostasis, and clear urine output.  Muscles and peritoneum were closed with a running suture of 3-0 Vicryl.  Fascia was closed with a running suture of  0 PDS.  Skin was closed with  staples and Steri-Strips.  Sponge, instrument, and needle count reported  as correct by circulating nurse x2.  Foley catheter remained clear at the time of closure.  The patient  tolerated the procedure well and was returned to recovery room in good  condition.      Juluis Mire, M.D.  Electronically Signed     JSM/MEDQ  D:  07/29/2008  T:  07/29/2008  Job:  [id number]
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
## 2010-09-18 NOTE — H&P (Signed)
[name], [name]                  ACCOUNT NO.:  [id number]   MEDICAL RECORD NO.:  [id number]          PATIENT TYPE:  INP   LOCATION:                                [facility]   PHYSICIAN:  Juluis Mire, M.D.   DATE OF BIRTH:  April 07, 1976   DATE OF ADMISSION:  07/29/2008  DATE OF DISCHARGE:                              HISTORY & PHYSICAL   The patient is a 35 year old gravida 2, para 1 female whose estimated  date of confinement is April 2.  This gives her estimated gestational  age of [redacted] weeks.  She presents for repeat cesarean section.   Pregnancy has been complicated by a prior cesarean section.  Trial of  labor was discussed, declined by patient, desire for repeat cesarean  section.  The patient also had history of atrial fibrillation.  She has  been followed by cardiologist.  Has had minimal difficulty throughout  this pregnancy.  Otherwise, her prenatal course has been relatively  uncomplicated.   In terms of allergies, she is allergic to SULFA and COMPAZINE.  Medications include prenatal vitamins.  For past medical history,  family history, and social history, please see prenatal records.  Review  of systems is noncontributory.   PHYSICAL EXAMINATION:  GENERAL:  The patient is afebrile, stable vital  signs.  HEENT:  The patient is normocephalic.  Pupils equally round, reactive to  light and accommodation.  Extraocular movements were intact.  Sclerae  are clear.  Oropharynx clear.  NECK:  No thyromegaly.  BREASTS:  Not examined.  LUNGS:  Clear.  CARDIOVASCULAR SYSTEM:  Regular rhythm and rate without murmurs or  gallops.  ABDOMEN:  Gravid uterus consistent with dates.  PELVIC:  Cervix long and closed.  EXTREMITIES:  Trace edema.  NEUROLOGIC:  Grossly normal limits.   IMPRESSION:  1. Intrauterine pregnancy at term with prior cesarean section for      repeat.  2. History of cardiac arrhythmia presently under management.   PLAN:  The patient to repeat cesarean  section.  The risks of surgery  have been discussed including the risk of infection.  The risk of  hemorrhage that could require transfusion with the risk of AIDS or  hepatitis.  The risk of injury to adjacent organs including bladder,  bowel, ureters that could require further exploratory surgery.  The risk  of deep venous thrombosis and pulmonary embolus.  The patient does  understand potential risks and complications.      Juluis Mire, M.D.  Electronically Signed    JSM/MEDQ  D:  07/29/2008  T:  07/29/2008  Job:  [id number]

## 2010-09-18 NOTE — Assessment & Plan Note (Signed)
[facility]                            CARDIOLOGY OFFICE NOTE   NAME:[name], [name]                         MRN:          [id number]  DATE:09/02/2007                            DOB:          Nov 06, 1975    Mrs. Riederer returns today for further management of her pregnancy-  associated atrial fibrillation.   She had no clinical recurrence but has had a few brief palpitations that  last seconds.   She may be pregnant again; is going to find out next week.   She has mild posterior leaflet mitral valve prolapse by 2-D  echocardiogram.  There was a very minimal late systolic mitral  regurgitation.  Left atrium is upper limits of normal in size.   CURRENT MEDICATIONS:  1. Calcium.  2. Prenatal vitamins.   PHYSICAL EXAMINATION:  VITAL SIGNS:  Blood pressure today is 92/62, her  pulse is 60 and regular.  EKG is completely normal.  Weight is 106.  HEENT:  Unchanged.  LUNGS:  Clear.  HEART:  Regular rate and rhythm.  No click or murmur.  Carotid upstrokes  were full bilaterally without bruits.  There was no JVD.  Thyroid is not  enlarged.  Trachea is midline.  NECK:  Supple.  ABDOMEN:  Soft with good bowel sounds.  EXTREMITIES:  No edema.  Pulses are intact.  NEUROLOGIC:  Intact.   Ms. Quist is doing well.  With her potential new pregnancy, she may  have recurrent atrial fibrillation.  We have explained to her that if  she has brief episodes that last only minutes that she does not need to  come in; however, if she has sustained palpitations or thinks she is in  atrial fibrillation for any period of time, she needs to come to [facility]  emergency room.  We will just have to deal with it again!   Otherwise, I will see her back in a year.     Thomas C. Daleen Squibb, MD, [facility]  Electronically Signed    TCW/MedQ  DD: 09/02/2007  DT: 09/02/2007  Job #: [id number]   cc:   Juluis Mire, M.D.

## 2010-09-21 ENCOUNTER — Encounter: Payer: Self-pay | Admitting: Cardiology

## 2010-09-21 NOTE — Discharge Summary (Signed)
[name], [name]                  ACCOUNT NO.:  [id number]   MEDICAL RECORD NO.:  [id number]          PATIENT TYPE:  INP   LOCATION:  9114                          [facility]ILITY:  [facility]   PHYSICIAN:  Dineen Kid. Rana Snare, M.D.    DATE OF BIRTH:  1975/08/14   DATE OF ADMISSION:  07/29/2008  DATE OF DISCHARGE:  07/31/2008                               DISCHARGE SUMMARY   ADMITTING DIAGNOSES:  1. Intrauterine pregnancy at term.  2. Previous cesarean section, desires repeat.   DISCHARGE DIAGNOSES:  1. Status post low transverse cesarean section.  2. Viable female infant.   PROCEDURE:  Low transverse cesarean section.   REASON FOR ADMISSION:  Please see written H&P.   HOSPITAL COURSE:  The patient is a 35 year old gravida 2, para 1 that  was admitted to [facility] for scheduled cesarean  section.  The patient had had a previous cesarean delivery and desired  repeat.  On the morning of admission, the patient was taken to operating  room where spinal anesthesia was administered without difficulty.  A low-  transverse incision was made with delivery of a viable female infant  weighing 8 pounds 0 ounces with Apgars of 9 at 1 minute and 9 at 5  minutes.  The patient tolerated the procedure well and taken to the  recovery room in stable condition.  On postoperative day #1, the patient  was without complaint.  Vital signs remained stable.  She was afebrile.  Fundus was firm and nontender.  Abdominal dressing was noted to be  clean, dry, and intact.  Laboratory findings showed hemoglobin 11.0.  On  postoperative day #2, the patient did desire early discharge.  Vital  signs were stable.  She was afebrile.  Fundus was firm and nontender.  Abdominal dressing had been removed revealing an incision that was  clean, dry, and intact.  Staples were removed and the patient was later  discharged home.   CONDITION ON DISCHARGE:  Stable.   DIET:  Regular as tolerated.   ACTIVITY:  No heavy lifting,  no driving x2 weeks, no vaginal entry.   FOLLOWUP:  The patient to follow up in the office in 1-2 weeks for  incision check.  She is to call for temperature greater than 100  degrees, persistent nausea, vomiting, heavy vaginal bleeding, and/or  redness or drainage from the incisional site.   DISCHARGE MEDICATIONS:  1. Tylox #30 one p.o. every 4-6 hours p.r.n.  2. Motrin 600 mg every 6 hours.  3. Prenatal vitamins one p.o. daily.  4. Colace one p.o. daily p.r.n.      Julio Sicks, N.P.      Dineen Kid Rana Snare, M.D.  Electronically Signed    CC/MEDQ  D:  08/17/2008  T:  08/18/2008  Job:  [id number]

## 2010-09-21 NOTE — Op Note (Signed)
[name], [name]                  ACCOUNT NO.:  [id number]   MEDICAL RECORD NO.:  [id number]          PATIENT TYPE:  INP   LOCATION:  9147                          [facility]   PHYSICIAN:  Juluis Mire, M.D.   DATE OF BIRTH:  08/13/1961   DATE OF PROCEDURE:  07/06/2006  DATE OF DISCHARGE:                               OPERATIVE REPORT   PREOPERATIVE DIAGNOSIS:  Intrauterine pregnancy at term with failure to  progress.   POSTOPERATIVE DIAGNOSIS:  Intrauterine pregnancy at term with failure to  progress.   PROCEDURES:  Low transverse cesarean section.   SURGEON:  Juluis Mire, M.D.   ANESTHESIA:  Epidural.   ESTIMATED BLOOD LOSS:  500-700 mL.   PACKS AND DRAINS:  None.   BLOOD REPLACED:  None.   COMPLICATIONS:  None.   INDICATIONS:  The patient is a primigravida female at term who presents  for induction of labor after 2 days of induction.  She progressed to 2  cm with the arrest dilatation despite adequate uterine activity as  documented by intrauterine pressure catheter.  Decision was made to  proceed with primary cesarean section.  The risks were explained  including the risk of infection.  The risk of hemorrhage could require  transfusion with risk of AIDS or hepatitis.  Risk of injury to adjacent  organs including bladder, bowel, ureters that could require further  exploratory surgery.  Risk of deep venous thrombosis and pulmonary  embolus.  The patient expressed understanding of indications and risks.   DESCRIPTION OF PROCEDURE:  The patient was taken to OR, placed supine  position with left lateral tilt.  After satisfactory level of epidural  anesthesia obtained, the abdomen prepped out Betadine and draped sterile  field.  A low transverse skin incision was made with a knife and carried  through subcutaneous tissue.  Fascia was entered sharply and incision to  fascia extended laterally. Fascia taken off the muscle superiorly and  inferiorly.  Rectus  muscles were separated midline. Anterior peritoneum  was entered sharply and incision of peritoneum extended both superiorly  and inferiorly.  Low transverse bladder flap was developed. A low  transverse uterine scission knife and extended using manual traction.  The infant presented in the vertex presentation, was delivered with  elevation of head and fundal pressure. The infant was a viable female  Apgars were 9/10.  Umbilical artery pH and weight are pending.  Placenta  was then delivered manually.  Uterus was then exteriorized for closure.  Uterus closed in a running interlocking suture of 0 chromic using a two-  layer closure technique.  Hemostasis was good. Urine output was clear.  Tubes and ovaries were unremarkable.  Muscles and peritoneum closed with  a suture of 3-0 Vicryl.  Fascia was  closed with a suture 0 PDS.  Skin was closed staples Steri-Strips.  Sponge, instrument and needle count was correct by circulating nurse x2.  The Foley catheter remained clear at time of closure.  The patient  tolerated the procedure well and returned to recovery room in good  condition.  Juluis Mire, M.D.  Electronically Signed     JSM/MEDQ  D:  07/06/2006  T:  07/06/2006  Job:  [id number]

## 2010-09-21 NOTE — Assessment & Plan Note (Signed)
[facility]                            CARDIOLOGY OFFICE NOTE   NAME:Matthews, Danielle S                         MRN:          [id number]  DATE:05/07/2006                            DOB:          1976/03/31    [name] is a delightful 35 year old white female who is now 64  weeks+ pregnant.  This is her first pregnancy.   She presented with palpitations and was found to be in atrial  fibrillation at [facility] on May 02, 2006.  She was seen in  consultation by Dr. Nicholes Mango, one of my colleagues.  Her heart rate  was averaging about 120-140.  At one point, it went up to about 170.  We  know that fetal compromise is at 140 or greater.   She was admitted overnight.  She was given IV diltiazem, as well as  digoxin.  The next morning, she was in normal sinus rhythm.   Her hemoglobin was 12.9.  Magnesium was 2.0.  Her potassium was 3.7, her  thyroid indices were normal except for a slightly low 3T4.  Her thyroxin  level was normal, as was her TSH.  A 2D echocardiogram demonstrated  difficult windows.  She had an EF of 60%, mild increase in left  ventricular wall thickness, and it was very difficult to see regional  wall motion.  She had systolic mitral bowing of the anterior and  posterior leaflets, but no definite mitral valve prolapse.  There was  minimal pericardial effusion without any hemodynamic compromise.  The  rest of her echocardiogram was normal.   Since discharge, she has felt bad in general.  She has been sluggish,  fatigued, and has been dizzy on several occasions.  She said trying to  work was very difficult.   She is on diltiazem extended release 120 mg a day, Tums 2 b.i.d.,  digoxin 0.25 mg q. day, and prenatal vitamins.   The rest of her review of systems is negative.   PHYSICAL EXAMINATION:  Her exam today showed a blood pressure of 92/70.  At the hospital, she was 85/60.  This was when she was in atrial fib.  Her heart rate is 102 and she is sinus tach.  She has diffuse  inferolateral ST segment changes which she had before.  Her weight is  145 pounds.  SKIN:  Warm and dry.  She has good skin color.  HEENT:  Normocephalic, atraumatic.  PERRLA, extraocular movements  intact.  Sclerae clear.  Carotid upstrokes were bilaterally without bruits.  There is no JVD.  Thyroid is not enlarged.  LUNGS:  Clear.  HEART:  Reveals a regular rate and rhythm, no gallop, rub, or click.  ABDOMEN EXAM:  Gravid.  Bowel sounds are present.  EXTREMITIES:  Reveal no edema.  Pulses are present.  NEURO EXAM:  Intact.  SKIN:  Shows a slight area of what looks like a bruise above the  umbilicus.  There is no sign of any allergic reaction.  There is no  other rash present.   ASSESSMENT/PLAN:  I had a  long talk with [name] and her husband.  There is a lot of anxiety here, and I have tried to allay much of that.  Because she is so dizzy, and thinks the diltiazem is a major problem, I  have asked her to stop it.  She will stay on digoxin.  I have asked her  if she goes in atrial fib to lay down in a comfortable position.  Lay on  her left side to get the baby off the inferior vena cava.  Drink plenty  of fluids and take a diltiazem extended release.  If she checks her  carotid pulse and it is greater than 140, she is to come to the  emergency room at [facility].  If it is below 140 and she feels okay, she  can remain at home.   Her due date is early March.  Hopefully we can get her through this  time.   I would like to see her back here in the office in April.  We may want  to repeat her 2D echocardiogram at that time to get better pictures.  Hopefully, she will not need any long term Pharmacological therapy.  She  is very intolerant to medications.     Thomas C. Daleen Squibb, MD, [facility]  Electronically Signed    TCW/MedQ  DD: 05/07/2006  DT: 05/07/2006  Job #: [id number]   cc:   Juluis Mire, M.D.

## 2010-09-21 NOTE — Assessment & Plan Note (Signed)
[facility]                            CARDIOLOGY OFFICE NOTE   NAME:[name], [name]                         MRN:          [id number]  DATE:08/07/2006                            DOB:          [birth year]    Mrs. Bodkins returns today for further management of her paroxysmal  atrial fibrillation.   Please see my note from May 07, 2006.   She has delivered her baby boy, Reuel Boom, about a month ago.  She required  a C-section.  She had no complications that I can discern.   She continues on digoxin 0.25 mg a day and Taztia extended release 120  daily.   She denies any tachy palpitations.   On her examination today, her blood pressure 113/72 and her pulse is 69  and regular.  We did not repeat an EKG.  Her weight is down to 128.  NECK:  Shows no JVD.  Carotids are full.  There are no bruits.  Thyroid  is nontender and not enlarged.  LUNGS:  Clear.  HEART:  Reveals a nondisplaced PMI.  She has normal S1, S2.  ABDOMEN EXAMINATION:  Soft, good bowel sounds.  EXTREMITIES:  Reveal no edema.  Pulses are intact.  NEURO EXAMINATION:  Intact.   ASSESSMENT:  Paroxysmal atrial fibrillation with a rapid ventricular  rate during pregnancy.  She has had no recurrence, and now she is  postpartum.  Her 2D echocardiogram was technically very difficult during  her pregnancy.   PLAN:  Repeat 2D echocardiogram.  If this is structurally normal, I  would recommend stopping both medications and do a watch-and-wait  approach.  Hopefully this will not recur.  She is in agreement with this  plan.     Thomas C. Daleen Squibb, MD, [facility]  Electronically Signed    TCW/MedQ  DD: 08/07/2006  DT: 08/07/2006  Job #: [id number]   cc:   Juluis Mire, M.D.
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
## 2010-09-21 NOTE — Discharge Summary (Signed)
[name], [name]                  ACCOUNT NO.:  [id number]   MEDICAL RECORD NO.:  [id number]          PATIENT TYPE:  INP   LOCATION:  9147                          FACILITY:  [facility]   PHYSICIAN:  Dineen Kid. Rana Snare, M.D.    DATE OF BIRTH:  [birth year]   DATE OF ADMISSION:  07/04/2006  DATE OF DISCHARGE:  07/09/2006                               DISCHARGE SUMMARY   ADMITTING DIAGNOSES:  1. Intrauterine pregnancy at term.  2. Induction of labor.  3. History of atrial fibrillation.   DISCHARGE DIAGNOSES:  1. Status post low transverse cesarean section secondary to failure to      progress.  2. Viable female infant.   PROCEDURE:  Primary low transverse cesarean section.   REASON FOR ADMISSION:  Please see written H&P.   HOSPITAL COURSE:  The patient is 35 year old, primigravida, white,  married female that was admitted to [facility] at term  for an induction of labor.  Prenatal care had been complicated by atrial  fibrillation, which had been treated with __________ and digoxin without  reoccurrence of tachy arrhythmia.  The patient had an echocardiogram,  which was within normal limits.  The patient also was known for positive  group B beta strep.  On the morning of admission, the patient was  admitted.  Vital signs were stable.  Cervix was long and closed.  Fetal  heart tones were reactive.  The patient was started on IV Pitocin and  was administered IV penicillin G prophylactically for group B beta  strep.  After 2 days of an induction, the patient did progress to 2-cm  without further change in cervix with adequate uterine activity as  documented by intrauterine pressure catheter.  Due to failure to  progress, decision was made to proceed with a primary low transverse  cesarean section.  The patient was then transferred to the operating  room where epidural was dosed to an adequate surgical level.  A low  transverse incision was made with the delivery of a viable female  infant,  weighing 7 pounds 11 ounces, Apgar's of 9 at one minute and 9 at five  minutes.  The patient tolerated the procedure well and was taken to the  recovery room in stable condition.   On postoperative day #1, the patient did complain of PUPPS.  Vital signs  were stable.  She was afebrile.  Blood pressure 108/70, heart rate was  72 regular rate and rhythm with pansystolic murmur noted.  Abdomen soft  with decreased bowel sounds.  Fundus was firm and nontender.  Abdominal  dressing was noted to be clean, dry, and intact.  Laboratory findings  revealed hemoglobin of 11.  The patient was given IV dose of  hydrocortisone 100 mg, and subsequently some betamethasone 0.1% lotion,  which she was to apply to the affected area b.i.d.   On postoperative day #2, the patient did complain of some soreness.  Vital signs were stable.  She was afebrile.  Abdomen soft with good  return of bowel function.  Fundus was firm and nontender.  Incision  was  clean, dry, and intact.  PUPPS was noted to be improving.  Baby was Rh  made negative, and RhoGAM was not indicated.  On postoperative day #3,  the patient did state that PUPPS seemed to be  significantly better.  Vital signs stable.  She was afebrile.  Abdomen  soft.  Fundus firm and nontender.  Incision was clean, dry, and intact.  Staples removed.  The patient was later discharged home.   CONDITION ON DISCHARGE:  Stable.   DIET:  Regular as tolerated.   ACTIVITY:  No heavy lifting, no driving x2 weeks, no vaginal entry.   FOLLOW UP:  The patient is to follow up in the office in 1 week for an  incision check.   She is to call for a temperature greater than 100 degrees, persistent  nausea, vomiting, heavy vaginal bleeding, and/or redness or drainage  from incisional site.   DISCHARGE MEDICATIONS:  1. Tylox, #30, one p.o. every four to six hours p.r.n.  2. Motrin 600 mg every 6 hours.  3. Prenatal vitamins one p.o. daily.  4. Medrol Dosepak  as directed.  5. Otherwise, Colace p.r.n.      Julio Sicks, N.P.      Dineen Kid Rana Snare, M.D.  Electronically Signed    CC/MEDQ  D:  07/25/2006  T:  07/25/2006  Job:  [id number]

## 2010-09-26 ENCOUNTER — Ambulatory Visit (INDEPENDENT_AMBULATORY_CARE_PROVIDER_SITE_OTHER): Payer: Private Health Insurance - Indemnity | Admitting: Cardiology

## 2010-09-26 ENCOUNTER — Encounter: Payer: Self-pay | Admitting: Cardiology

## 2010-09-26 VITALS — BP 96/60 | HR 72 | Resp 12 | Ht 61.0 in | Wt 129.0 lb

## 2010-09-26 DIAGNOSIS — I4891 Unspecified atrial fibrillation: Secondary | ICD-10-CM

## 2010-09-26 MED ORDER — LABETALOL HCL 100 MG PO TABS: ORAL_TABLET | ORAL | Status: DC

## 2010-09-26 NOTE — Assessment & Plan Note (Signed)
Will give RX for prn Labetolol.

## 2010-09-26 NOTE — Patient Instructions (Signed)
Your physician has recommended you make the following change in your medication:  Labetalol 100mg   Every 1 hour up to 3 doses in a day as needed. Your physician recommends that you schedule a follow-up appointment in: 6-8 months with Dr. Daleen Squibb '

## 2010-09-26 NOTE — Progress Notes (Signed)
   Patient ID: [name], female    DOB: 06/15/1975, 35 y.o.   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 She is [redacted] weeks pregnant with her 3rd child. She has had several episodes of PAF, one that lasted till she went to sleep after taking phenergan. She also has a history of MVP with normal LV function. She has been treated in the past successfully with prn Labetolol.  EKG today is normal.    Review of Systems  All other systems reviewed and are negative.      Physical Exam  Nursing note and vitals reviewed. Constitutional: She is oriented to person, place, and time. She appears well-developed and well-nourished. No distress.  HENT:  Head: Normocephalic and atraumatic.  Eyes: EOM are normal. Pupils are equal, round, and reactive to light.  Neck: Neck supple. No JVD present. No tracheal deviation present. No thyromegaly present.  Cardiovascular: Normal rate, regular rhythm, normal heart sounds and intact distal pulses.  Exam reveals no gallop.   No murmur heard. Pulmonary/Chest: Effort normal and breath sounds normal.  Abdominal: Soft. Bowel sounds are normal. She exhibits distension.  Musculoskeletal: Normal range of motion. She exhibits no edema.  Neurological: She is alert and oriented to person, place, and time.  Skin: Skin is warm and dry.  Psychiatric: She has a normal mood and affect.

## 2010-10-09 ENCOUNTER — Ambulatory Visit (HOSPITAL_COMMUNITY)
Admission: RE | Admit: 2010-10-09 | Discharge: 2010-10-09 | Disposition: A | Discharge status: Home / Self Care | Payer: Private Health Insurance - Indemnity | Source: Ambulatory Visit | Attending: Obstetrics and Gynecology | Admitting: Obstetrics and Gynecology

## 2010-10-09 ENCOUNTER — Other Ambulatory Visit (HOSPITAL_COMMUNITY): Payer: Self-pay | Admitting: Obstetrics and Gynecology

## 2010-10-09 DIAGNOSIS — IMO0002 Reserved for concepts with insufficient information to code with codable children: Secondary | ICD-10-CM

## 2010-10-09 DIAGNOSIS — Z0489 Encounter for examination and observation for other specified reasons: Secondary | ICD-10-CM

## 2010-10-09 DIAGNOSIS — O289 Unspecified abnormal findings on antenatal screening of mother: Secondary | ICD-10-CM | POA: Insufficient documentation

## 2010-10-09 DIAGNOSIS — Q270 Congenital absence and hypoplasia of umbilical artery: Secondary | ICD-10-CM

## 2010-10-09 DIAGNOSIS — R772 Abnormality of alphafetoprotein: Secondary | ICD-10-CM

## 2010-10-09 DIAGNOSIS — Z363 Encounter for antenatal screening for malformations: Secondary | ICD-10-CM | POA: Insufficient documentation

## 2010-10-09 DIAGNOSIS — O358XX0 Maternal care for other (suspected) fetal abnormality and damage, not applicable or unspecified: Secondary | ICD-10-CM | POA: Insufficient documentation

## 2010-10-09 DIAGNOSIS — Z1389 Encounter for screening for other disorder: Secondary | ICD-10-CM | POA: Insufficient documentation

## 2010-10-25 ENCOUNTER — Other Ambulatory Visit: Payer: Self-pay | Admitting: Hematology and Oncology

## 2010-10-25 ENCOUNTER — Encounter (HOSPITAL_BASED_OUTPATIENT_CLINIC_OR_DEPARTMENT_OTHER): Payer: Private Health Insurance - Indemnity | Admitting: Hematology and Oncology

## 2010-10-25 DIAGNOSIS — D6800 Von Willebrand disease, unspecified: Secondary | ICD-10-CM

## 2010-10-25 DIAGNOSIS — D689 Coagulation defect, unspecified: Secondary | ICD-10-CM

## 2010-10-25 DIAGNOSIS — R17 Unspecified jaundice: Secondary | ICD-10-CM

## 2010-10-25 LAB — COMPREHENSIVE METABOLIC PANEL
AST: 12 U/L (ref 0–37)
CO2: 24 mEq/L (ref 19–32)
Calcium: 9.7 mg/dL (ref 8.4–10.5)
Chloride: 103 mEq/L (ref 96–112)
Creatinine, Ser: 0.54 mg/dL (ref 0.50–1.10)
Glucose, Bld: 84 mg/dL (ref 70–99)
Potassium: 4.3 mEq/L (ref 3.5–5.3)
Total Bilirubin: 0.6 mg/dL (ref 0.3–1.2)

## 2010-10-25 LAB — IVY BLEEDING TIME: Bleeding Time: 9 Minutes — ABNORMAL HIGH (ref 2.0–8.0)

## 2010-10-25 LAB — CBC WITH DIFFERENTIAL/PLATELET
Basophils Absolute: 0 10*3/uL (ref 0.0–0.1)
EOS%: 0.5 % (ref 0.0–7.0)
Eosinophils Absolute: 0 10*3/uL (ref 0.0–0.5)
HCT: 37.3 % (ref 34.8–46.6)
MCHC: 34.2 g/dL (ref 31.5–36.0)
MCV: 90.6 fL (ref 79.5–101.0)
MONO%: 4.4 % (ref 0.0–14.0)
NEUT%: 74.2 % (ref 38.4–76.8)
Platelets: 203 10*3/uL (ref 145–400)
RBC: 4.12 10*6/uL (ref 3.70–5.45)

## 2010-11-01 LAB — VON WILLEBRAND FACTOR MULTIMER
Factor-VIII Activity: 67 % (ref 50–180)
Ristocetin Co-Factor: 75 % (ref 42–200)
Von Willebrand Factor Ag: 92 % (ref 50–217)

## 2010-11-03 ENCOUNTER — Other Ambulatory Visit: Payer: Self-pay | Admitting: Internal Medicine

## 2010-11-03 DIAGNOSIS — D6800 Von Willebrand disease, unspecified: Secondary | ICD-10-CM

## 2010-11-20 ENCOUNTER — Encounter (HOSPITAL_COMMUNITY): Payer: Self-pay

## 2010-11-20 ENCOUNTER — Ambulatory Visit (HOSPITAL_COMMUNITY)
Admission: RE | Admit: 2010-11-20 | Discharge: 2010-11-20 | Disposition: A | Discharge status: Home / Self Care | Payer: Private Health Insurance - Indemnity | Source: Ambulatory Visit | Attending: Obstetrics and Gynecology | Admitting: Obstetrics and Gynecology

## 2010-11-20 DIAGNOSIS — O34219 Maternal care for unspecified type scar from previous cesarean delivery: Secondary | ICD-10-CM | POA: Insufficient documentation

## 2010-11-20 DIAGNOSIS — Z3689 Encounter for other specified antenatal screening: Secondary | ICD-10-CM | POA: Insufficient documentation

## 2010-11-20 DIAGNOSIS — IMO0001 Reserved for inherently not codable concepts without codable children: Secondary | ICD-10-CM | POA: Insufficient documentation

## 2010-11-20 DIAGNOSIS — O289 Unspecified abnormal findings on antenatal screening of mother: Secondary | ICD-10-CM | POA: Insufficient documentation

## 2010-11-20 DIAGNOSIS — Q270 Congenital absence and hypoplasia of umbilical artery: Secondary | ICD-10-CM

## 2010-11-26 ENCOUNTER — Encounter (HOSPITAL_COMMUNITY): Payer: Self-pay | Admitting: Radiology

## 2010-12-18 ENCOUNTER — Telehealth: Payer: Self-pay | Admitting: Cardiology

## 2010-12-18 NOTE — Telephone Encounter (Signed)
Patient called to update contact information, I changed her home phone number to the number requested.

## 2010-12-25 ENCOUNTER — Other Ambulatory Visit: Payer: Self-pay | Admitting: Hematology and Oncology

## 2010-12-25 ENCOUNTER — Encounter (HOSPITAL_BASED_OUTPATIENT_CLINIC_OR_DEPARTMENT_OTHER): Payer: Private Health Insurance - Indemnity | Admitting: Hematology and Oncology

## 2010-12-25 DIAGNOSIS — D6800 Von Willebrand disease, unspecified: Secondary | ICD-10-CM

## 2010-12-27 LAB — VON WILLEBRAND PANEL: Ristocetin Co-factor, Plasma: 85 % (ref 42–200)

## 2011-02-05 ENCOUNTER — Encounter (HOSPITAL_BASED_OUTPATIENT_CLINIC_OR_DEPARTMENT_OTHER): Payer: Private Health Insurance - Indemnity | Admitting: Hematology and Oncology

## 2011-02-05 ENCOUNTER — Other Ambulatory Visit: Payer: Self-pay | Admitting: Hematology and Oncology

## 2011-02-05 DIAGNOSIS — D689 Coagulation defect, unspecified: Secondary | ICD-10-CM

## 2011-02-07 LAB — VON WILLEBRAND PANEL: Ristocetin Co-factor, Plasma: 143 % (ref 42–200)

## 2011-02-12 ENCOUNTER — Encounter (HOSPITAL_BASED_OUTPATIENT_CLINIC_OR_DEPARTMENT_OTHER): Payer: Managed Care, Other (non HMO) | Admitting: Hematology and Oncology

## 2011-02-12 DIAGNOSIS — D6800 Von Willebrand disease, unspecified: Secondary | ICD-10-CM

## 2011-02-12 DIAGNOSIS — D689 Coagulation defect, unspecified: Secondary | ICD-10-CM

## 2011-02-24 ENCOUNTER — Encounter (HOSPITAL_COMMUNITY): Payer: Self-pay | Admitting: Obstetrics and Gynecology

## 2011-02-24 ENCOUNTER — Inpatient Hospital Stay (HOSPITAL_COMMUNITY)
Admission: AD | Admit: 2011-02-24 | Discharge: 2011-02-24 | Disposition: A | Discharge status: Home / Self Care | Payer: Managed Care, Other (non HMO) | Source: Ambulatory Visit | Attending: Obstetrics and Gynecology | Admitting: Obstetrics and Gynecology

## 2011-02-24 DIAGNOSIS — O368190 Decreased fetal movements, unspecified trimester, not applicable or unspecified: Secondary | ICD-10-CM | POA: Insufficient documentation

## 2011-02-24 HISTORY — DX: Von Willebrand's disease: D68.0

## 2011-02-24 HISTORY — DX: Von Willebrand disease, unspecified: D68.00

## 2011-02-24 MED ORDER — GUAIFENESIN-CODEINE 100-10 MG/5ML PO SYRP: 5.0000 mL | ORAL_SOLUTION | Freq: Three times a day (TID) | ORAL | Status: AC | PRN

## 2011-02-24 NOTE — Progress Notes (Signed)
I was asked by Telford Nab CNM to call Dr. Rana Snare regarding patients arrival to MAU. Dr. Rana Snare was notified of patient concern of decreased fetal movement. Dr.Lowe notified of reactive tracing, notified him that Telford Nab CNM assessed patient and would like to provide her with cough med. With codeine due to patients complaint of cough/congestion. Dr. Rana Snare ok with that. Dr. Andrey Cota ok for patient to be discharged home.
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
## 2011-03-01 ENCOUNTER — Encounter (HOSPITAL_COMMUNITY): Payer: Self-pay

## 2011-03-04 ENCOUNTER — Encounter (HOSPITAL_COMMUNITY)
Admission: RE | Admit: 2011-03-04 | Discharge: 2011-03-04 | Disposition: A | Discharge status: Home / Self Care | Payer: Managed Care, Other (non HMO) | Source: Ambulatory Visit | Attending: Obstetrics and Gynecology | Admitting: Obstetrics and Gynecology

## 2011-03-04 ENCOUNTER — Encounter (HOSPITAL_COMMUNITY): Payer: Self-pay

## 2011-03-04 ENCOUNTER — Other Ambulatory Visit (HOSPITAL_COMMUNITY): Payer: Private Health Insurance - Indemnity

## 2011-03-04 HISTORY — DX: Acute upper respiratory infection, unspecified: J06.9

## 2011-03-04 HISTORY — DX: Gastro-esophageal reflux disease without esophagitis: K21.9

## 2011-03-04 HISTORY — DX: Personal history of other diseases of the circulatory system: Z86.79

## 2011-03-04 LAB — CBC
HCT: 34.1 % — ABNORMAL LOW (ref 36.0–46.0)
MCH: 29 pg (ref 26.0–34.0)
MCV: 90 fL (ref 78.0–100.0)
Platelets: 216 10*3/uL (ref 150–400)
RBC: 3.79 MIL/uL — ABNORMAL LOW (ref 3.87–5.11)

## 2011-03-04 LAB — SURGICAL PCR SCREEN: Staphylococcus aureus: NEGATIVE

## 2011-03-04 NOTE — Pre-Procedure Instructions (Signed)
Notified Dr [name] of pt history Von Willebrands and Atrial Fib. Pt states she sees Dr Dalene Carrow at [facility] and Von Willebrands gone during pregnancy-no orders for extra lab work done. Pt states Atrial Fib only with pregnancy and uses 100mg  Labetolol prn -Dr [name] requests pt to take Labetolol am of surgery- pt states  she is concerned with taking without At [facility] discussing with pt-she will either take it at home or hold and take when she gets to [facility]. B/p at pat visit 107/70.

## 2011-03-04 NOTE — Pre-Procedure Instructions (Signed)
Requested ECHO from [facility] cardiology

## 2011-03-04 NOTE — Patient Instructions (Addendum)
   Your procedure is scheduled on: Friday, November 2nd  Enter through the [employer] [facility] at: [employer] up the phone at the desk and dial [phone] and inform us of your arrival.  Please call this number if you have any problems the morning of surgery: [phone]  Remember: Do not eat food after midnight: Thursday Do not drink clear liquids after:midnight Thursday Take these medicines the morning of surgery with a SIP OF WATER: protonix,  Do not wear jewelry, make-up, or FINGER nail polish Do not wear lotions, powders, or perfumes.  You may not wear deodorant. Do not shave 48 hours prior to surgery. Do not bring valuables to the hospital.  Leave suitcase in the car. After Surgery it may be brought to your room. For patients being admitted to the hospital, checkout time is 11:00am the day of discharge.   Remember to use your hibiclens as instructed.Please shower with 1/2 bottle the evening before your surgery and the other 1/2 bottle the morning of surgery.

## 2011-03-05 NOTE — H&P (Signed)
NAMECORNESHIA, [name]                  ACCOUNT NO.:  [id number]  MEDICAL RECORD NO.:  [id number]  LOCATION:  PERIO                         FACILITY:  [facility]  PHYSICIAN:  Juluis Mire, M.D.   DATE OF BIRTH:  1975/08/02  DATE OF ADMISSION:  02/28/2011 DATE OF DISCHARGE:                             HISTORY & PHYSICAL   The patient is a 35 year old, gravida 3, para 2, abortus 0 female with an estimated gestational age of [redacted] weeks by good dates and ultrasound. She has had 2 prior cesarean section, now presents for repeat cesarean section.  In terms of her prenatal course that was complicated by first trimester ultrasound screening to indicate increased risk of Downs of 1 in 53. She declined amniocentesis.  Underwent a detailed evaluation through Maternal Fetal Medicine with ultrasound.  No abnormalities were noted. She still declined amniocentesis.  They did find a 2-vessel cord. Subsequent ultrasound of the baby's renal system as well as echocardiogram were normal.  We followed her with serial ultrasounds as well as nonstress testing, all of which have been unremarkable.  The patient has a personal history of atrial fibrillation.  It has been managed in the past by cardiologist.  She did not have any significant issues with this during the pregnancy.  The patient has a history of von Willebrand's disease.  She had been evaluated by her hematology/oncologist.  They feel in view of the type of von Willebrand's disease she has, spinal, anal, or epidural would not be of significant issue.  In terms of allergies, the patient is allergic to SULFA and COMPAZINE.  Medications include prenatal vitamins.  PRENATAL LABS:  She is A negative with a negative antibody screen, negative toxoplasmosis, nonreactive syphilis serology, immune to rubella.  Negative hepatitis B surface antigen, nonreactive HIV screening, and her previous cystic fibrosis screen was also negative. She was positive for group B  strep.  For past medical history, family history, social history, please see prenatal records.  REVIEW OF SYSTEMS:  Noncontributory.  PHYSICAL EXAMINATION:  VITAL SIGNS:  The patient is afebrile with stable vital signs. LUNGS:  Clear. CARDIOVASCULAR:  Regular rhythm and rate.  No murmurs or gallops. ABDOMEN:  Gravid uterus consistent with dates. PELVIC:  Deferred. EXTREMITIES:  Trace edema. NEUROLOGIC:  Grossly within normal limits.  Deep tendon reflexes are 2+, no clonus.  IMPRESSION: 1. Intrauterine pregnancy at 39 weeks with prior cesarean section for     repeat. 2. Von Willebrand disease. 3. Infant with 2 vessel cord. 4. First-trimester screen indicating increased risk of Down with     negative workup on Maternal Fetal Medicine. 5. History of atrial fibrillation. 6. Positive group B strep.  PLAN:  The patient will undergo repeat cesarean section.  The risks have been discussed including the risk of infection.  The risk of hemorrhage that could require transfusion with the risk of AIDS or hepatitis. Excessive bleeding that could require hysterectomy.  Risk of injury to adjacent organs including bladder, bowel, ureters, could require further exploratory surgery.  Risk of deep venous thrombosis and pulmonary emboli.  The patient expressed understanding of the potential risks and complications.     Raphael Gibney.  Arelia Sneddon, M.D.     JSM/MEDQ  D:  03/05/2011  T:  03/05/2011  Job:  [id number]

## 2011-03-05 NOTE — H&P (Signed)
  Patient name  [name] DICTATION# [id number] CSN# [id number]  [facility], MD 03/05/2011 6:47 AM

## 2011-03-08 ENCOUNTER — Inpatient Hospital Stay (HOSPITAL_COMMUNITY)
Admission: RE | Admit: 2011-03-08 | Discharge: 2011-03-10 | DRG: 765 | Disposition: A | Discharge status: Home / Self Care | Payer: Managed Care, Other (non HMO) | Source: Ambulatory Visit | Attending: Obstetrics and Gynecology | Admitting: Obstetrics and Gynecology

## 2011-03-08 ENCOUNTER — Encounter (HOSPITAL_COMMUNITY): Payer: Self-pay | Admitting: Anesthesiology

## 2011-03-08 ENCOUNTER — Encounter (HOSPITAL_COMMUNITY)
Admission: AD | Disposition: A | Discharge status: Home / Self Care | Payer: Self-pay | Source: Ambulatory Visit | Attending: Obstetrics and Gynecology

## 2011-03-08 ENCOUNTER — Encounter (HOSPITAL_COMMUNITY)
Admission: RE | Disposition: A | Discharge status: Home / Self Care | Payer: Self-pay | Source: Ambulatory Visit | Attending: Obstetrics and Gynecology

## 2011-03-08 ENCOUNTER — Inpatient Hospital Stay (HOSPITAL_COMMUNITY): Payer: Managed Care, Other (non HMO) | Admitting: Anesthesiology

## 2011-03-08 DIAGNOSIS — O99892 Other specified diseases and conditions complicating childbirth: Secondary | ICD-10-CM | POA: Diagnosis present

## 2011-03-08 DIAGNOSIS — Z01818 Encounter for other preprocedural examination: Secondary | ICD-10-CM

## 2011-03-08 DIAGNOSIS — Z22330 Carrier of Group B streptococcus: Secondary | ICD-10-CM

## 2011-03-08 DIAGNOSIS — O34219 Maternal care for unspecified type scar from previous cesarean delivery: Principal | ICD-10-CM | POA: Diagnosis present

## 2011-03-08 DIAGNOSIS — D6800 Von Willebrand disease, unspecified: Secondary | ICD-10-CM | POA: Diagnosis present

## 2011-03-08 DIAGNOSIS — I4891 Unspecified atrial fibrillation: Secondary | ICD-10-CM | POA: Diagnosis present

## 2011-03-08 DIAGNOSIS — Z01812 Encounter for preprocedural laboratory examination: Secondary | ICD-10-CM

## 2011-03-08 DIAGNOSIS — D689 Coagulation defect, unspecified: Secondary | ICD-10-CM | POA: Diagnosis present

## 2011-03-08 DIAGNOSIS — K429 Umbilical hernia without obstruction or gangrene: Secondary | ICD-10-CM | POA: Diagnosis present

## 2011-03-08 LAB — TYPE AND SCREEN
ABO/RH(D): A NEG
Antibody Screen: NEGATIVE

## 2011-03-08 SURGERY — Surgical Case
Anesthesia: Regional

## 2011-03-08 SURGERY — Surgical Case
Anesthesia: Regional | Site: Uterus | Wound class: Clean Contaminated

## 2011-03-08 MED ORDER — MEPERIDINE HCL 25 MG/ML IJ SOLN: 6.2500 mg | INTRAMUSCULAR | Status: DC | PRN

## 2011-03-08 MED ORDER — HYDROMORPHONE HCL 1 MG/ML IJ SOLN
INTRAMUSCULAR | Status: AC
  Administered 2011-03-08: 0.5 mg via INTRAVENOUS
  Filled 2011-03-08: qty 1

## 2011-03-08 MED ORDER — ONDANSETRON HCL 4 MG PO TABS: 4.0000 mg | ORAL_TABLET | ORAL | Status: DC | PRN

## 2011-03-08 MED ORDER — DIBUCAINE 1 % RE OINT: 1.0000 "application " | TOPICAL_OINTMENT | RECTAL | Status: DC | PRN

## 2011-03-08 MED ORDER — PHENYLEPHRINE HCL 10 MG/ML IJ SOLN
INTRAMUSCULAR | Status: DC | PRN
  Administered 2011-03-08: 120 ug via INTRAVENOUS
  Administered 2011-03-08 (×2): 200 ug via INTRAVENOUS
  Administered 2011-03-08: 80 ug via INTRAVENOUS

## 2011-03-08 MED ORDER — FENTANYL CITRATE 0.05 MG/ML IJ SOLN
INTRAMUSCULAR | Status: AC
  Filled 2011-03-08: qty 2

## 2011-03-08 MED ORDER — NALBUPHINE SYRINGE 5 MG/0.5 ML
5.0000 mg | INJECTION | INTRAMUSCULAR | Status: DC | PRN
  Administered 2011-03-08: 10 mg via INTRAVENOUS
  Filled 2011-03-08 (×2): qty 1

## 2011-03-08 MED ORDER — ONDANSETRON HCL 4 MG/2ML IJ SOLN
INTRAMUSCULAR | Status: DC | PRN
  Administered 2011-03-08: 4 mg via INTRAVENOUS

## 2011-03-08 MED ORDER — EPHEDRINE SULFATE 50 MG/ML IJ SOLN
INTRAMUSCULAR | Status: DC | PRN
  Administered 2011-03-08: 10 mg via INTRAVENOUS

## 2011-03-08 MED ORDER — BUPIVACAINE IN DEXTROSE 0.75-8.25 % IT SOLN
INTRATHECAL | Status: DC | PRN
  Administered 2011-03-08: 1.5 mL via INTRATHECAL

## 2011-03-08 MED ORDER — SODIUM CHLORIDE 0.9 % IJ SOLN: 3.0000 mL | Freq: Two times a day (BID) | INTRAMUSCULAR | Status: DC

## 2011-03-08 MED ORDER — GUAIFENESIN 100 MG/5ML PO SOLN
5.0000 mL | ORAL | Status: DC | PRN
  Administered 2011-03-08 – 2011-03-09 (×4): 100 mg via ORAL
  Filled 2011-03-08 (×5): qty 15

## 2011-03-08 MED ORDER — PHENYLEPHRINE 40 MCG/ML (10ML) SYRINGE FOR IV PUSH (FOR BLOOD PRESSURE SUPPORT)
PREFILLED_SYRINGE | INTRAVENOUS | Status: AC
  Filled 2011-03-08: qty 5

## 2011-03-08 MED ORDER — SODIUM CHLORIDE 0.9 % IJ SOLN: 3.0000 mL | INTRAMUSCULAR | Status: DC | PRN

## 2011-03-08 MED ORDER — DIPHENHYDRAMINE HCL 25 MG PO CAPS: 25.0000 mg | ORAL_CAPSULE | ORAL | Status: DC | PRN

## 2011-03-08 MED ORDER — OXYTOCIN 20 UNITS IN LACTATED RINGERS INFUSION - SIMPLE
INTRAVENOUS | Status: DC | PRN
  Administered 2011-03-08: 20 [IU] via INTRAVENOUS

## 2011-03-08 MED ORDER — LACTATED RINGERS IV SOLN
INTRAVENOUS | Status: DC
  Administered 2011-03-08 (×2): via INTRAVENOUS

## 2011-03-08 MED ORDER — FLEET ENEMA 7-19 GM/118ML RE ENEM: 1.0000 | ENEMA | RECTAL | Status: DC | PRN

## 2011-03-08 MED ORDER — HYDROMORPHONE HCL 1 MG/ML IJ SOLN
0.2500 mg | INTRAMUSCULAR | Status: DC | PRN
  Administered 2011-03-08 (×2): 0.5 mg via INTRAVENOUS

## 2011-03-08 MED ORDER — OXYTOCIN 20 UNITS IN LACTATED RINGERS INFUSION - SIMPLE
125.0000 mL/h | INTRAVENOUS | Status: AC
  Administered 2011-03-08: 20 [IU] via INTRAVENOUS

## 2011-03-08 MED ORDER — LANOLIN HYDROUS EX OINT: 1.0000 "application " | TOPICAL_OINTMENT | CUTANEOUS | Status: DC | PRN

## 2011-03-08 MED ORDER — OXYTOCIN 10 UNIT/ML IJ SOLN
INTRAMUSCULAR | Status: AC
  Filled 2011-03-08: qty 2

## 2011-03-08 MED ORDER — HYDROMORPHONE HCL PF 1 MG/ML IJ SOLN
1.0000 mg | INTRAMUSCULAR | Status: DC | PRN
  Administered 2011-03-08: 1 mg via INTRAVENOUS

## 2011-03-08 MED ORDER — OXYTOCIN 20 UNITS IN LACTATED RINGERS INFUSION - SIMPLE
INTRAVENOUS | Status: AC
  Administered 2011-03-08: 20 [IU] via INTRAVENOUS
  Filled 2011-03-08: qty 1000

## 2011-03-08 MED ORDER — WITCH HAZEL-GLYCERIN EX PADS: 1.0000 "application " | MEDICATED_PAD | CUTANEOUS | Status: DC | PRN

## 2011-03-08 MED ORDER — LACTATED RINGERS IV SOLN
INTRAVENOUS | Status: DC | PRN
  Administered 2011-03-08: 08:00:00 via INTRAVENOUS

## 2011-03-08 MED ORDER — ONDANSETRON HCL 4 MG/2ML IJ SOLN: 4.0000 mg | Freq: Three times a day (TID) | INTRAMUSCULAR | Status: DC | PRN

## 2011-03-08 MED ORDER — IBUPROFEN 600 MG PO TABS: 600.0000 mg | ORAL_TABLET | Freq: Four times a day (QID) | ORAL | Status: DC

## 2011-03-08 MED ORDER — ZOLPIDEM TARTRATE 5 MG PO TABS: 5.0000 mg | ORAL_TABLET | Freq: Every evening | ORAL | Status: DC | PRN

## 2011-03-08 MED ORDER — KETOROLAC TROMETHAMINE 30 MG/ML IJ SOLN: 30.0000 mg | Freq: Four times a day (QID) | INTRAMUSCULAR | Status: DC | PRN

## 2011-03-08 MED ORDER — SODIUM CHLORIDE 0.9 % IV SOLN: 250.0000 mL | INTRAVENOUS | Status: DC

## 2011-03-08 MED ORDER — KETOROLAC TROMETHAMINE 60 MG/2ML IM SOLN
60.0000 mg | Freq: Once | INTRAMUSCULAR | Status: DC | PRN
  Filled 2011-03-08: qty 2

## 2011-03-08 MED ORDER — DIPHENHYDRAMINE HCL 25 MG PO CAPS: 25.0000 mg | ORAL_CAPSULE | Freq: Four times a day (QID) | ORAL | Status: DC | PRN

## 2011-03-08 MED ORDER — DIPHENHYDRAMINE HCL 50 MG/ML IJ SOLN: 25.0000 mg | INTRAMUSCULAR | Status: DC | PRN

## 2011-03-08 MED ORDER — ONDANSETRON HCL 4 MG/2ML IJ SOLN: 4.0000 mg | Freq: Once | INTRAMUSCULAR | Status: DC

## 2011-03-08 MED ORDER — TETANUS-DIPHTH-ACELL PERTUSSIS 5-2.5-18.5 LF-MCG/0.5 IM SUSP: 0.5000 mL | Freq: Once | INTRAMUSCULAR | Status: DC

## 2011-03-08 MED ORDER — ONDANSETRON HCL 4 MG/2ML IJ SOLN
INTRAMUSCULAR | Status: AC
  Filled 2011-03-08: qty 2

## 2011-03-08 MED ORDER — METOCLOPRAMIDE HCL 5 MG/ML IJ SOLN: 10.0000 mg | Freq: Three times a day (TID) | INTRAMUSCULAR | Status: DC | PRN

## 2011-03-08 MED ORDER — MORPHINE SULFATE (PF) 0.5 MG/ML IJ SOLN
INTRAMUSCULAR | Status: DC | PRN
  Administered 2011-03-08: .1 ug via INTRATHECAL

## 2011-03-08 MED ORDER — SENNOSIDES-DOCUSATE SODIUM 8.6-50 MG PO TABS
2.0000 | ORAL_TABLET | Freq: Every day | ORAL | Status: DC
  Administered 2011-03-08 – 2011-03-09 (×2): 2 via ORAL

## 2011-03-08 MED ORDER — BISACODYL 10 MG RE SUPP: 10.0000 mg | Freq: Every day | RECTAL | Status: DC | PRN

## 2011-03-08 MED ORDER — CEFAZOLIN SODIUM 1-5 GM-% IV SOLN
INTRAVENOUS | Status: AC
  Filled 2011-03-08: qty 50

## 2011-03-08 MED ORDER — CEFAZOLIN SODIUM 1-5 GM-% IV SOLN
INTRAVENOUS | Status: DC | PRN
  Administered 2011-03-08: 1 g via INTRAVENOUS

## 2011-03-08 MED ORDER — SODIUM CHLORIDE 0.9 % IV SOLN
1.0000 ug/kg/h | INTRAVENOUS | Status: DC | PRN
  Filled 2011-03-08: qty 2.5

## 2011-03-08 MED ORDER — SIMETHICONE 80 MG PO CHEW
80.0000 mg | CHEWABLE_TABLET | Freq: Three times a day (TID) | ORAL | Status: DC
  Administered 2011-03-08 – 2011-03-10 (×5): 80 mg via ORAL

## 2011-03-08 MED ORDER — MENTHOL 3 MG MT LOZG
1.0000 | LOZENGE | OROMUCOSAL | Status: DC | PRN
  Administered 2011-03-09 (×2): 3 mg via ORAL
  Filled 2011-03-08 (×3): qty 9

## 2011-03-08 MED ORDER — SCOPOLAMINE 1 MG/3DAYS TD PT72
1.0000 | MEDICATED_PATCH | Freq: Once | TRANSDERMAL | Status: DC
  Administered 2011-03-08: 1.5 mg via TRANSDERMAL

## 2011-03-08 MED ORDER — SIMETHICONE 80 MG PO CHEW: 80.0000 mg | CHEWABLE_TABLET | ORAL | Status: DC | PRN

## 2011-03-08 MED ORDER — EPHEDRINE 5 MG/ML INJ
INTRAVENOUS | Status: AC
  Filled 2011-03-08: qty 10

## 2011-03-08 MED ORDER — MORPHINE SULFATE 0.5 MG/ML IJ SOLN
INTRAMUSCULAR | Status: AC
  Filled 2011-03-08: qty 10

## 2011-03-08 MED ORDER — NALBUPHINE SYRINGE 5 MG/0.5 ML
5.0000 mg | INJECTION | INTRAMUSCULAR | Status: DC | PRN
  Filled 2011-03-08: qty 1

## 2011-03-08 MED ORDER — FENTANYL CITRATE 0.05 MG/ML IJ SOLN
INTRAMUSCULAR | Status: DC | PRN
  Administered 2011-03-08: 15 ug via INTRATHECAL

## 2011-03-08 MED ORDER — LACTATED RINGERS IV SOLN
INTRAVENOUS | Status: DC
Start: 2011-03-08 — End: 2011-03-10
  Administered 2011-03-08: 17:00:00 via INTRAVENOUS

## 2011-03-08 MED ORDER — CEFAZOLIN SODIUM 1-5 GM-% IV SOLN: 1.0000 g | INTRAVENOUS | Status: DC

## 2011-03-08 MED ORDER — PRENATAL PLUS 27-1 MG PO TABS
1.0000 | ORAL_TABLET | Freq: Every day | ORAL | Status: DC
  Administered 2011-03-09 – 2011-03-10 (×2): 1 via ORAL
  Filled 2011-03-08 (×2): qty 1

## 2011-03-08 MED ORDER — NALOXONE HCL 0.4 MG/ML IJ SOLN: 0.4000 mg | INTRAMUSCULAR | Status: DC | PRN

## 2011-03-08 MED ORDER — IBUPROFEN 600 MG PO TABS: 600.0000 mg | ORAL_TABLET | Freq: Four times a day (QID) | ORAL | Status: DC | PRN

## 2011-03-08 MED ORDER — OXYCODONE-ACETAMINOPHEN 5-325 MG PO TABS
1.0000 | ORAL_TABLET | ORAL | Status: DC | PRN
  Administered 2011-03-08: 1 via ORAL
  Administered 2011-03-09 (×2): 2 via ORAL
  Administered 2011-03-09: 1 via ORAL
  Administered 2011-03-09 (×3): 2 via ORAL
  Filled 2011-03-08 (×6): qty 2
  Filled 2011-03-08: qty 1

## 2011-03-08 MED ORDER — HYDROMORPHONE HCL PF 1 MG/ML IJ SOLN
1.0000 mg | Freq: Once | INTRAMUSCULAR | Status: AC
  Administered 2011-03-08: 1 mg via INTRAVENOUS
  Filled 2011-03-08: qty 1

## 2011-03-08 MED ORDER — ONDANSETRON HCL 4 MG/2ML IJ SOLN: 4.0000 mg | INTRAMUSCULAR | Status: DC | PRN

## 2011-03-08 MED ORDER — DIPHENHYDRAMINE HCL 50 MG/ML IJ SOLN
12.5000 mg | INTRAMUSCULAR | Status: DC | PRN
  Administered 2011-03-08: 12.5 mg via INTRAVENOUS
  Filled 2011-03-08: qty 1

## 2011-03-08 SURGICAL SUPPLY — 27 items
CLOTH BEACON ORANGE TIMEOUT ST (SAFETY) ×2 IMPLANT
DRESSING TELFA 8X3 (GAUZE/BANDAGES/DRESSINGS) ×1 IMPLANT
DRSG COVADERM 4X10 (GAUZE/BANDAGES/DRESSINGS) ×1 IMPLANT
ELECT REM PT RETURN 9FT ADLT (ELECTROSURGICAL) ×2
ELECTRODE REM PT RTRN 9FT ADLT (ELECTROSURGICAL) ×1 IMPLANT
EXTRACTOR VACUUM M CUP 4 TUBE (SUCTIONS) IMPLANT
GAUZE SPONGE 4X4 12PLY STRL LF (GAUZE/BANDAGES/DRESSINGS) ×3 IMPLANT
GLOVE BIO SURGEON STRL SZ7 (GLOVE) ×4 IMPLANT
GOWN PREVENTION PLUS LG XLONG (DISPOSABLE) ×4 IMPLANT
KIT ABG SYR 3ML LUER SLIP (SYRINGE) ×2 IMPLANT
NDL HYPO 25X5/8 SAFETYGLIDE (NEEDLE) ×1 IMPLANT
NEEDLE HYPO 25X5/8 SAFETYGLIDE (NEEDLE) ×2 IMPLANT
NS IRRIG 1000ML POUR BTL (IV SOLUTION) ×2 IMPLANT
PACK C SECTION WH (CUSTOM PROCEDURE TRAY) ×2 IMPLANT
PAD ABD 7.5X8 STRL (GAUZE/BANDAGES/DRESSINGS) ×1 IMPLANT
STAPLER VISISTAT 35W (STAPLE) IMPLANT
STRIP CLOSURE SKIN 1/4X4 (GAUZE/BANDAGES/DRESSINGS) ×2 IMPLANT
SUT CHROMIC 0 CT 1 (SUTURE) IMPLANT
SUT CHROMIC 0 CTX 36 (SUTURE) ×4 IMPLANT
SUT CHROMIC 2 0 SH (SUTURE) IMPLANT
SUT PDS AB 0 CT 36 (SUTURE) ×3 IMPLANT
SUT PLAIN 2 0 XLH (SUTURE) IMPLANT
SUT VIC AB 3-0 CT1 27 (SUTURE) ×2
SUT VIC AB 3-0 CT1 TAPERPNT 27 (SUTURE) ×1 IMPLANT
TOWEL OR 17X24 6PK STRL BLUE (TOWEL DISPOSABLE) ×4 IMPLANT
TRAY FOLEY CATH 14FR (SET/KITS/TRAYS/PACK) ×2 IMPLANT
WATER STERILE IRR 1000ML POUR (IV SOLUTION) ×2 IMPLANT

## 2011-03-08 NOTE — Anesthesia Postprocedure Evaluation (Signed)
Anesthesia Post Note  Patient: [name]  Procedure(s) Performed:  CESAREAN SECTION  Anesthesia type: Spinal  Patient location: PACU  Post pain: Pain level controlled  Post assessment: Post-op Vital signs reviewed  Last Vitals:  Filed Vitals:   03/08/11 0830  BP: 110/71  Pulse: 96  Temp:   Resp: 18    Post vital signs: Reviewed  Level of consciousness: awake  Complications: No apparent anesthesia complications

## 2011-03-08 NOTE — Consult Note (Signed)
Neonatology Note:  Attendance at C-section:  I was asked to attend this repeat C/S at term. The mother is a G3P2 A neg, GBS unknown with Von Willebrand's disease. ROM at delivery, fluid bloody. Infant vigorous with good spontaneous cry and tone. Needed only minimal bulb suctioning. Ap 9/9. Lungs clear to ausc in DR. To CN to care of Pediatrician.  Milagro Belmares, MD  

## 2011-03-08 NOTE — Anesthesia Postprocedure Evaluation (Signed)
  Anesthesia Post-op Note  Patient: [name]  Procedure(s) Performed:  CESAREAN SECTION  Patient Location: 126  Anesthesia Type: Spinal  Level of Consciousness: awake and alert   Airway and Oxygen Therapy: Patient Spontanous Breathing  Post-op Pain: mild  Post-op Assessment: Patient's Cardiovascular Status Stable and Respiratory Function Stable  Post-op Vital Signs: stable  Complications: No apparent anesthesia complications

## 2011-03-08 NOTE — Progress Notes (Signed)
  Patient name [name] DICTATION#  [id number] CSN#  [id number]  [facility], MD 03/08/2011 8:16 AM

## 2011-03-08 NOTE — Op Note (Signed)
[name], [name]                  ACCOUNT NO.:  [id number]  MEDICAL RECORD NO.:  [id number]  LOCATION:  9126                          FACILITY:  [facility]  PHYSICIAN:  Juluis Mire, M.D.   DATE OF BIRTH:  December 29, 1975  DATE OF PROCEDURE:  03/08/2011 DATE OF DISCHARGE:                              OPERATIVE REPORT   PREOPERATIVE DIAGNOSIS:  Intrauterine pregnancy at 39+ weeks with prior cesarean section for repeat.  POSTOPERATIVE DIAGNOSIS:  Intrauterine pregnancy at 39+ weeks with prior cesarean section for repeat.  PROCEDURE:  Repeat low transverse cesarean section and repair of umbilical hernia.  SURGEON:  Juluis Mire, MD  ANESTHESIA:  Spinal.  ESTIMATED BLOOD LOSS:  600 mL.  PACKS AND DRAINS:  None.  INTRAOPERATIVE BLOOD PLACEMENT:  None.  COMPLICATIONS:  None.  INDICATION:  Dictated in history and physical.  PROCEDURE IN DETAIL:  The patient was taken to OR and placed in supine position with left lateral tilt.  After satisfactory level of spinal anesthesia was obtained, the abdomen was prepped out with Betadine and draped in sterile field.  Prior low transverse skin incision was excised.  Incision was extended through the subcutaneous tissue.  Fascia was entered sharply and incision of fascia extended laterally.  The fascia was taken off the muscle superiorly and inferiorly.  Rectus muscles were separated in the midline.  Anterior perineum was entered, and incision of perineum extended both superiorly and inferiorly.  A bladder flap was developed.  Low transverse uterine incision begun with knife and extended laterally using manual traction.  The infant presented in vertex presentation, was delivered with elevation of head and fundal pressure.  The infant was a viable female, weighing 8 pounds 9 ounces.  Apgars were 9/9.  Umbilical artery pH was 7.30.  Amniotic fluid was clear.  There was a nuchal cord x2.  Placenta was delivered and sent to L and D.  Uterus  exteriorized for closure.  Uterus closed in a locking suture of 0-chromic using a 2-layer closure technique.  The areas of bleeding were brought under control with figure-of-eights of 0- chromic.  Tubes and ovaries were unremarkable.  Uterus returned to abdominal cavity.  We irrigated the pelvis, had good hemostasis, and clear urine output.  We evaluated the umbilical area.  Previous Prolene suture was noted.  There may have been a small slight defect.  I placed 2 more sutures of 0-Prolene and tied these down with hopefully resolution of her umbilical hernia.  At this point in time, muscle of the peritoneum closed with a suture of 3-0 Vicryl and fascia closed with a running suture of 0-PDS.  Skin was closed with staples and Steri-Strips.  Sponge, instrument, and needle count reported correct by circulating nurse x2.  Foley catheter remained clear at time of closure.  The patient tolerated the procedure well and was returned to the recovery in good condition.     Juluis Mire, M.D.     JSM/MEDQ  D:  03/08/2011  T:  03/08/2011  Job:  [id number]

## 2011-03-08 NOTE — Anesthesia Procedure Notes (Signed)
Spinal Block  Patient location during procedure: OR Start time: 03/08/2011 7:23 AM Staffing Performed by: anesthesiologist  Preanesthetic Checklist Completed: patient identified, site marked, surgical consent, pre-op evaluation, timeout performed, IV checked, risks and benefits discussed and monitors and equipment checked Spinal Block Patient position: sitting Prep: site prepped and draped and DuraPrep Patient monitoring: heart rate, cardiac monitor, continuous pulse ox and blood pressure Approach: midline Location: L3-4 Injection technique: single-shot Needle Needle type: Sprotte  Needle gauge: 24 G Needle length: 9 cm Assessment Sensory level: T4 Additional Notes Clear free flow csf on first attempt

## 2011-03-08 NOTE — Transfer of Care (Signed)
Immediate Anesthesia Transfer of Care Note  Patient: [name]  Procedure(s) Performed:  CESAREAN SECTION  Patient Location: PACU  Anesthesia Type: Spinal  Level of Consciousness: awake, alert  and oriented  Airway & Oxygen Therapy: Patient Spontanous Breathing  Post-op Assessment: Report given to PACU RN and Post -op Vital signs reviewed and stable  Post vital signs: Reviewed and stable  Complications: No apparent anesthesia complications

## 2011-03-08 NOTE — H&P (Signed)
No changed in history.  physicasl unchanged.

## 2011-03-08 NOTE — Anesthesia Preprocedure Evaluation (Signed)
Anesthesia Evaluation  Patient identified by MRN, date of birth, ID band Patient awake    Reviewed: Allergy & Precautions, H&P , NPO status , Patient's Chart, lab work & pertinent test results, reviewed documented beta blocker date and time   History of Anesthesia Complications Negative for: history of anesthetic complications (h/o c/s x2, epidural and spinal in past without problems)  Airway Mallampati: III TM Distance: >3 FB Neck ROM: full    Dental  (+) Teeth Intact   Pulmonary Recent URI  (cough x 3 weeks), Residual Cough,  clear to auscultation  Pulmonary exam normal       Cardiovascular + dysrhythmias (labetalol prn - none in 3-4 weeks) Atrial Fibrillation + Valvular Problems/Murmurs MVP regular Normal    Neuro/Psych  Headaches (headache now, last migraine prior to pregnancy), Chronic back pain Negative Psych ROS   GI/Hepatic Neg liver ROS, GERD-  Medicated,  Endo/Other  Negative Endocrine ROS  Renal/GU negative Renal ROS  Genitourinary negative   Musculoskeletal   Abdominal   Peds  Hematology  (+) Blood dyscrasia (von willebrand's diagnosed during w/u for bruising - per pt "levels are normal", has had spinal and epidural before, no bleeding after prior deliveries), ,   Anesthesia Other Findings   Reproductive/Obstetrics (+) Pregnancy                           Anesthesia Physical Anesthesia Plan  ASA: III  Anesthesia Plan: Spinal   Post-op Pain Management:    Induction:   Airway Management Planned:   Additional Equipment:   Intra-op Plan:   Post-operative Plan:   Informed Consent: I have reviewed the patients History and Physical, chart, labs and discussed the procedure including the risks, benefits and alternatives for the proposed anesthesia with the patient or authorized representative who has indicated his/her understanding and acceptance.   Dental Advisory Given  Plan  Discussed with: CRNA and Surgeon  Anesthesia Plan Comments:         Anesthesia Quick Evaluation

## 2011-03-08 NOTE — Addendum Note (Signed)
Addendum  created 03/08/11 1712 by Fanny Dance   Modules edited:Notes Section

## 2011-03-08 NOTE — Brief Op Note (Signed)
03/08/2011  8:15 AM  PATIENT:  [name]  35 y.o. female  PRE-OPERATIVE DIAGNOSIS:  Von Willebrand's Disease, History of Atrial Fibrillation with Pregnancy  POST-OPERATIVE DIAGNOSIS:  Von Willebrand's Disease, History of Atrial Fibrillation with Pregnancy  PROCEDURE:  Procedure(s): CESAREAN SECTION  SURGEON:  Surgeon(s): Krist Rosenboom S Michelina Mexicano  PHYSICIAN ASSISTANT:   ASSISTANTS: none   ANESTHESIA:   spinal  EBL:  Total I/O In: 1000 [I.V.:1000] Out: 850 [Urine:50; Blood:800]  BLOOD ADMINISTERED:none  DRAINS: Urinary Catheter (Foley)   LOCAL MEDICATIONS USED:  NONE  SPECIMEN:  No Specimen  DISPOSITION OF SPECIMEN:  N/A  COUNTS:  YES  TOURNIQUET:  * No tourniquets in log *  DICTATION: .Other Dictation: Dictation Number [id number]  PLAN OF CARE: Admit to inpatient   PATIENT DISPOSITION:  PACU - hemodynamically stable.   Delay start of Pharmacological VTE agent (>24hrs) due to surgical blood loss or risk of bleeding:  no

## 2011-03-09 LAB — CBC
HCT: 30.8 % — ABNORMAL LOW (ref 36.0–46.0)
MCH: 28.4 pg (ref 26.0–34.0)
MCV: 88.5 fL (ref 78.0–100.0)
RDW: 13.9 % (ref 11.5–15.5)
WBC: 8.7 10*3/uL (ref 4.0–10.5)

## 2011-03-09 MED ORDER — BENZONATATE 100 MG PO CAPS
200.0000 mg | ORAL_CAPSULE | Freq: Three times a day (TID) | ORAL | Status: DC | PRN
  Administered 2011-03-09 – 2011-03-10 (×3): 200 mg via ORAL
  Filled 2011-03-09 (×2): qty 2

## 2011-03-09 MED ORDER — AMOXICILLIN-POT CLAVULANATE 875-125 MG PO TABS
1.0000 | ORAL_TABLET | Freq: Two times a day (BID) | ORAL | Status: DC
  Administered 2011-03-09 – 2011-03-10 (×3): 1 via ORAL
  Filled 2011-03-09 (×3): qty 1

## 2011-03-09 MED ORDER — HYDROMORPHONE HCL 2 MG PO TABS
4.0000 mg | ORAL_TABLET | ORAL | Status: DC | PRN
  Administered 2011-03-10 (×3): 4 mg via ORAL
  Filled 2011-03-09 (×3): qty 2

## 2011-03-09 NOTE — Progress Notes (Signed)
C/O cough for 3 weeks-has been treated with Zpack, and was taking Augmentin when admitted for C/S. Continues with cough, occassionally produces some sputum. No sore throat. Incision hurts with cough.  Blood pressure 104/69, pulse 83, temperature 98.6 F (37 C), temperature source Oral, resp. rate 16, weight 73.029 kg (161 lb), last menstrual period 05/25/2010, SpO2 96.00%.  Lungs CTA Abd soft, BS +, incision OK  A: satisfactory PO       Cough  P: resume Augmentin     Tessalon prn

## 2011-03-09 NOTE — Progress Notes (Signed)
Congested cough, occassional productive w/yellow/green sputum. Had cough for three weeks, on ABT prior admission to hospital. Using IS. Lung sounds dimenished this am, more autible this pm, very congested cough. Makes pain intolerable to incision area when coughing. Ambulating in hall. Passing gas. Kpad to back for comfort for lower back pain at epidural area. ABT given as well.

## 2011-03-10 ENCOUNTER — Encounter (HOSPITAL_COMMUNITY): Payer: Self-pay | Admitting: *Deleted

## 2011-03-10 MED ORDER — HYDROMORPHONE HCL 4 MG PO TABS: 4.0000 mg | ORAL_TABLET | ORAL | Status: AC | PRN

## 2011-03-10 MED ORDER — AMOXICILLIN-POT CLAVULANATE 875-125 MG PO TABS: 1.0000 | ORAL_TABLET | Freq: Two times a day (BID) | ORAL | Status: AC

## 2011-03-10 MED ORDER — BENZONATATE 200 MG PO CAPS: 200.0000 mg | ORAL_CAPSULE | Freq: Three times a day (TID) | ORAL | Status: AC | PRN

## 2011-03-10 NOTE — Discharge Summary (Signed)
Obstetric Discharge Summary Reason for Admission: cesarean section Prenatal Procedures: ultrasound Intrapartum Procedures: cesarean: low cervical, transverse Postpartum Procedures: none Complications-Operative and Postpartum: none HGB  Date Value Range Status  10/25/2010 12.8  11.6-15.9 (g/dL) Final     Hemoglobin  Date Value Range Status  03/09/2011 9.9* 12.0-15.0 (g/dL) Final     HCT  Date Value Range Status  03/09/2011 30.8* 36.0-46.0 (%) Final  10/25/2010 37.3  34.8-46.6 (%) Final    Discharge Diagnoses: Term Pregnancy-delivered  Discharge Information: Date: 03/10/2011 Activity: pelvic rest Diet: routine Medications: PNV and dilaudid Condition: stable Instructions: refer to practice specific booklet Discharge to: home Follow-up Information    Follow up with [facility]. Make an appointment in 4 days.   Contact information:   [facility] [address], [address] [place] [address] [phone]          Newborn Data: Live born female  Birth Weight: 8 lb 9.6 oz (3900 g) APGAR: 9, 9  Home with mother.  Jorgeluis Gurganus II,Hennessey Cantrell E 03/10/2011, 8:02 AM

## 2011-03-10 NOTE — Progress Notes (Signed)
Cough a little better Ambulating, voiding, passing flatus, tolerating regular diet  Blood pressure 98/67, pulse 92, temperature 97.7 F (36.5 C), temperature source Oral, resp. rate 18, weight 73.029 kg (161 lb), last menstrual period 05/25/2010, SpO2 96.00%, unknown if currently breastfeeding.  Incision healing well-no erythema, no induration  D/C home Finish augmentin at home Dilaudid 4mg , #30,

## 2011-03-11 ENCOUNTER — Encounter (HOSPITAL_COMMUNITY): Payer: Self-pay | Admitting: Obstetrics and Gynecology

## 2011-03-14 ENCOUNTER — Ambulatory Visit (HOSPITAL_COMMUNITY)
Admission: RE | Admit: 2011-03-14 | Discharge: 2011-03-14 | Disposition: A | Discharge status: Home / Self Care | Payer: Managed Care, Other (non HMO) | Source: Ambulatory Visit | Attending: Obstetrics and Gynecology | Admitting: Obstetrics and Gynecology

## 2011-03-14 ENCOUNTER — Other Ambulatory Visit (HOSPITAL_COMMUNITY): Payer: Self-pay | Admitting: Obstetrics and Gynecology

## 2011-03-14 DIAGNOSIS — R053 Chronic cough: Secondary | ICD-10-CM

## 2011-03-14 DIAGNOSIS — R059 Cough, unspecified: Secondary | ICD-10-CM | POA: Insufficient documentation

## 2011-03-14 DIAGNOSIS — R05 Cough: Secondary | ICD-10-CM | POA: Insufficient documentation

## 2011-03-20 LAB — VON WILLEBRAND PANEL
Coagulation Factor VIII: 184 % — ABNORMAL HIGH (ref 73–140)
Von Willebrand Antigen, Plasma: 168 % (ref 50–217)

## 2011-04-08 ENCOUNTER — Telehealth: Payer: Self-pay | Admitting: *Deleted

## 2011-04-08 NOTE — Telephone Encounter (Signed)
sent patient appointment for 09-2011 printed out calendar and mailed out to the patient

## 2011-04-15 ENCOUNTER — Telehealth: Payer: Self-pay | Admitting: Hematology and Oncology

## 2011-04-15 NOTE — Telephone Encounter (Signed)
Returned pt's call and r/s 5/1 and 5/8 appts to 5/7 and 5/14. Per pt can't come on those days due to childcare.

## 2011-09-04 ENCOUNTER — Other Ambulatory Visit: Payer: Managed Care, Other (non HMO) | Admitting: Lab

## 2011-09-06 ENCOUNTER — Telehealth: Payer: Self-pay | Admitting: Hematology and Oncology

## 2011-09-06 NOTE — Telephone Encounter (Signed)
Pt called today to move 5/7 lb to 5/8. Pt also needs to r/s 5/14 f/u. Message sent to LO for new d/t. Pt aware she will be contacted w/new appt,

## 2011-09-10 ENCOUNTER — Other Ambulatory Visit: Payer: Self-pay | Admitting: *Deleted

## 2011-09-10 ENCOUNTER — Other Ambulatory Visit: Payer: Managed Care, Other (non HMO) | Admitting: Lab

## 2011-09-10 DIAGNOSIS — D692 Other nonthrombocytopenic purpura: Secondary | ICD-10-CM

## 2011-09-10 DIAGNOSIS — D892 Hypergammaglobulinemia, unspecified: Secondary | ICD-10-CM

## 2011-09-11 ENCOUNTER — Other Ambulatory Visit: Payer: Self-pay | Admitting: *Deleted

## 2011-09-11 ENCOUNTER — Ambulatory Visit: Payer: Managed Care, Other (non HMO) | Admitting: Hematology and Oncology

## 2011-09-11 ENCOUNTER — Telehealth: Payer: Self-pay | Admitting: Hematology and Oncology

## 2011-09-11 ENCOUNTER — Other Ambulatory Visit (HOSPITAL_BASED_OUTPATIENT_CLINIC_OR_DEPARTMENT_OTHER): Payer: Managed Care, Other (non HMO) | Admitting: Lab

## 2011-09-11 ENCOUNTER — Telehealth: Payer: Self-pay | Admitting: *Deleted

## 2011-09-11 DIAGNOSIS — D892 Hypergammaglobulinemia, unspecified: Secondary | ICD-10-CM

## 2011-09-11 DIAGNOSIS — D692 Other nonthrombocytopenic purpura: Secondary | ICD-10-CM

## 2011-09-11 LAB — CBC WITH DIFFERENTIAL/PLATELET
BASO%: 0.5 % (ref 0.0–2.0)
Basophils Absolute: 0 10*3/uL (ref 0.0–0.1)
EOS%: 1.1 % (ref 0.0–7.0)
HCT: 40.5 % (ref 34.8–46.6)
LYMPH%: 52.6 % — ABNORMAL HIGH (ref 14.0–49.7)
MCH: 29.5 pg (ref 25.1–34.0)
MCHC: 33.8 g/dL (ref 31.5–36.0)
MONO#: 0.4 10*3/uL (ref 0.1–0.9)
NEUT%: 37.3 % — ABNORMAL LOW (ref 38.4–76.8)
Platelets: 163 10*3/uL (ref 145–400)

## 2011-09-11 NOTE — Telephone Encounter (Signed)
S/w pt today re appt for 6/4.

## 2011-09-11 NOTE — Telephone Encounter (Signed)
Pt came for labs today.   Spoke with pt on cell phone and informed pt that md could see pt on 09/12/11.   Pt declined and stated she could not come on 5/9  Nor  09/17/11 as scheduled due to conflicts.  However, pt wished to reschedule.   Informed pt that a scheduler will contact pt with f/u appt as per md.    Pt voiced understanding.

## 2011-09-14 LAB — VON WILLEBRAND PANEL: Von Willebrand Antigen, Plasma: 53 % (ref 50–217)

## 2011-09-17 ENCOUNTER — Ambulatory Visit: Payer: Managed Care, Other (non HMO) | Admitting: Hematology and Oncology

## 2011-09-17 ENCOUNTER — Telehealth: Payer: Self-pay | Admitting: *Deleted

## 2011-09-17 NOTE — Telephone Encounter (Signed)
Spoke with pt today.  Offered pt new date and time for f/u on 09/18/11.   Pt declined due to conflict with another appt.   Pt stated it is ok for her to wait for f/u in June.

## 2011-10-08 ENCOUNTER — Encounter: Payer: Self-pay | Admitting: Hematology and Oncology

## 2011-10-08 ENCOUNTER — Ambulatory Visit (HOSPITAL_BASED_OUTPATIENT_CLINIC_OR_DEPARTMENT_OTHER): Payer: Managed Care, Other (non HMO) | Admitting: Hematology and Oncology

## 2011-10-08 VITALS — BP 96/63 | HR 73 | Temp 97.5°F | Ht 61.0 in | Wt 120.8 lb

## 2011-10-08 DIAGNOSIS — O99893 Other specified diseases and conditions complicating puerperium: Secondary | ICD-10-CM

## 2011-10-08 DIAGNOSIS — D6800 Von Willebrand disease, unspecified: Secondary | ICD-10-CM

## 2011-10-08 NOTE — Progress Notes (Signed)
This office note has been dictated.

## 2011-10-08 NOTE — Progress Notes (Signed)
CC:   Sanda Linger, MD  IDENTIFYING STATEMENT:  Patient is a 36 year old woman with a history of mild von Willebrand's disease who presents for followup.  INTERVAL HISTORY:  Ms. Ansley has von Willebrand's disease and was seen during her 3rd pregnancy.  Her baby was born healthy 7 months ago.  She had a C-section.  Her levels improved during pregnancy and thus von Willebrand replacement was not required.  She has had no issues with bleeding or bruising since then.  In general, she has had no severe bleeding episodes in the past.  Has had wisdom tooth extracted with no complications.  She does not have heavy periods.  von Willebrand levels have decreased to her normal levels postpregnancy and on 09/11/2011, von Willebrand's antigen was 53%.  Ristocetin cofactor 41%.  Hemoglobin and hematocrit 13.7 and 40.5, respectively.  MEDICATIONS:  Multivitamins.  ALLERGIES:  Compazine, sulfonamides.  PHYSICAL EXAM:  Alert x3.  Vitals.  Pulse 73, blood pressure 96/63, temperature 97.5, respirations 16, weight 120 pounds.  The sclerae anicteric.  Mouth moist.  Chest:  Clear.  Abdomen:  Soft, nontender. Bowel sounds present.  LAB DATA:  As above.  IMPRESSION AND PLAN:  Ms. Lawlor is a 36 year old woman with von Willebrand's disease.  She got through an uneventful pregnancy with no complications.  Her von Willebrand levels are back at her baseline and she is asymptomatic.  She has had no past history for bleeding but was warned that she is to inform her health care team her diagnosis and specifically prior to any invasive surgeries.  She voiced her understanding  and will followup p.r.n.    ______________________________ Laurice Record, M.D. LIO/MEDQ  D:  10/08/2011  T:  10/08/2011  Job:  [id number]

## 2011-10-10 ENCOUNTER — Other Ambulatory Visit: Payer: Self-pay | Admitting: *Deleted

## 2011-10-11 ENCOUNTER — Telehealth: Payer: Self-pay | Admitting: Medical Oncology

## 2011-10-11 NOTE — Telephone Encounter (Signed)
She returned the nurse's  call from yesterday.

## 2011-10-17 ENCOUNTER — Telehealth: Payer: Self-pay | Admitting: Medical Oncology

## 2011-10-17 NOTE — Telephone Encounter (Addendum)
Second call back from pt returning nurses call from last week. I will route this note to  Dr Lonell Face nurse

## 2012-07-23 ENCOUNTER — Ambulatory Visit (INDEPENDENT_AMBULATORY_CARE_PROVIDER_SITE_OTHER): Payer: BC Managed Care – HMO | Admitting: Internal Medicine

## 2012-07-23 ENCOUNTER — Encounter: Payer: Self-pay | Admitting: Internal Medicine

## 2012-07-23 VITALS — BP 118/68 | HR 68 | Temp 98.4°F | Ht 61.0 in | Wt 115.0 lb

## 2012-07-23 DIAGNOSIS — T788XXS Other adverse effects, not elsewhere classified, sequela: Secondary | ICD-10-CM

## 2012-07-23 DIAGNOSIS — T7840XS Allergy, unspecified, sequela: Secondary | ICD-10-CM

## 2012-07-23 DIAGNOSIS — T7589XS Other specified effects of external causes, sequela: Secondary | ICD-10-CM

## 2012-07-23 MED ORDER — METHYLPREDNISOLONE ACETATE 80 MG/ML IJ SUSP
80.0000 mg | Freq: Once | INTRAMUSCULAR | Status: AC
  Administered 2012-07-23: 80 mg via INTRAMUSCULAR

## 2012-07-23 MED ORDER — PREDNISONE 10 MG PO TABS: ORAL_TABLET | ORAL | Status: DC

## 2012-07-23 MED ORDER — EPINEPHRINE 0.3 MG/0.3ML IJ DEVI: 0.3000 mg | Freq: Once | INTRAMUSCULAR | Status: DC

## 2012-07-23 MED ORDER — PROMETHAZINE HCL 12.5 MG PO TABS: 12.5000 mg | ORAL_TABLET | Freq: Four times a day (QID) | ORAL | Status: DC | PRN

## 2012-07-23 NOTE — Progress Notes (Signed)
HPI  Pt presents to the clinic today with c/o swelling of the tongue and eyes which started last night. She does have multiple food allergies but she is positive she did not eat anything that she is allergic to. She has not taken any new medications. She has had symptoms like this in the past before she was diagnosed with food allergies. She is not wheezing or having any difficulty breathing. She did take benadryl this morning and the swelling has unchanged. She does have an epi pen but it is expired.  Review of Systems      Past Medical History  Diagnosis Date  . Broken leg     BROKEN LEG AND HAND AFTER ACCIDENT  . MVP (mitral valve prolapse)     MILD WITH NORMAL LV FX ON ECHO 4/08  . Von Willebrand disease     Hematologist- [facility]-pt states mild and gone during pregnancy  . Hx of atrial fibrillation, no current medication     PAT with pregnancy only-with last 2 pregnancy and currrent-using labetalol prn  . Upper respiratory infection current    finished Z Pak, new prescription for Augmentum to start today 03/04/11  . GERD (gastroesophageal reflux disease)   . Atrial fibrillation     PAROXYSMAL DURING PREGNANCY-Dr Wall Cardiologist  . A-fib     Family History  Problem Relation Age of Onset  . Hypertension Father     HAD CATH THAT WAS NORMAL  . Other Mother     PALPITATION    History   Social History  . Marital Status: Married    Spouse Name: N/A    Number of Children: N/A  . Years of Education: N/A   Occupational History  . Not on file.   Social History Main Topics  . Smoking status: Never Smoker   . Smokeless tobacco: Not on file  . Alcohol Use: No  . Drug Use: No  . Sexually Active: Yes   Other Topics Concern  . Not on file   Social History Narrative   Full time mom,married. Regular exercise.    Allergies  Allergen Reactions  . Avocado Nausea And Vomiting  . Pineapple Hives  . Sesame Oil Hives  . Almond Oil   . Cardamom  (Elettaria Cardamomum Minuscula)   . Prochlorperazine Edisylate Other (See Comments)    Patient states that the reaction to the compazine is lockjaw.  . Sulfonamide Derivatives Hives     Constitutional:  Denies headache, fatigue, fever or abrupt weight changes.  HEENT:  Positive swollen tongue and under eyes. Denies eye redness, eye pain, pressure behind the eyes, facial pain, nasal congestion, ear pain, ringing in the ears, wax buildup, runny nose or bloody nose. Respiratory:  Denies cough, difficulty breathing or shortness of breath.  Cardiovascular: Denies chest pain, chest tightness, palpitations or swelling in the hands or feet.   No other specific complaints in a complete review of systems (except as listed in HPI above).  Objective:   BP 118/68  Pulse 68  Temp(Src) 98.4 F (36.9 C) (Oral)  Ht 5\' 1"  (1.549 m)  Wt 115 lb (52.164 kg)  BMI 21.74 kg/m2  SpO2 97% Wt Readings from Last 3 Encounters:  07/23/12 115 lb (52.164 kg)  10/08/11 120 lb 12.8 oz (54.795 kg)  03/08/11 161 lb (73.029 kg)     General: Appears her stated age, well developed, well nourished in NAD. HEENT: Head: normal shape and size; Eyes: sclera white, no icterus, conjunctiva pink, PERRLA  and EOMs intact, mild bilateral periorbital edema; Ears: Tm's gray and intact, normal light reflex; Nose: mucosa pink and moist, septum midline; Throat/Mouth:  Teeth present, mucosa pink and moist, no exudate noted, no lesions or ulcerations noted. Tongue enlarged but not occluding the airway. Neck: Neck supple, trachea midline. No massses, lumps or thyromegaly present.  Cardiovascular: Normal rate and rhythm. S1,S2 noted.  No murmur, rubs or gallops noted. No JVD or BLE edema. No carotid bruits noted. Pulmonary/Chest: Normal effort and positive vesicular breath sounds. No respiratory distress. No wheezes, rales or ronchi noted.      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

## 2012-07-23 NOTE — Patient Instructions (Signed)

## 2013-01-08 ENCOUNTER — Encounter: Payer: Self-pay | Admitting: *Deleted

## 2013-01-08 ENCOUNTER — Ambulatory Visit (INDEPENDENT_AMBULATORY_CARE_PROVIDER_SITE_OTHER): Payer: BC Managed Care – HMO | Admitting: Cardiology

## 2013-01-08 ENCOUNTER — Encounter: Payer: Self-pay | Admitting: Cardiology

## 2013-01-08 ENCOUNTER — Encounter (INDEPENDENT_AMBULATORY_CARE_PROVIDER_SITE_OTHER): Payer: BC Managed Care – HMO

## 2013-01-08 ENCOUNTER — Telehealth: Payer: Self-pay | Admitting: Cardiology

## 2013-01-08 VITALS — BP 110/77 | HR 57 | Resp 16 | Ht 61.0 in | Wt 116.0 lb

## 2013-01-08 DIAGNOSIS — I341 Nonrheumatic mitral (valve) prolapse: Secondary | ICD-10-CM

## 2013-01-08 DIAGNOSIS — I4891 Unspecified atrial fibrillation: Secondary | ICD-10-CM

## 2013-01-08 DIAGNOSIS — R0789 Other chest pain: Secondary | ICD-10-CM

## 2013-01-08 DIAGNOSIS — R002 Palpitations: Secondary | ICD-10-CM

## 2013-01-08 DIAGNOSIS — I059 Rheumatic mitral valve disease, unspecified: Secondary | ICD-10-CM

## 2013-01-08 DIAGNOSIS — R42 Dizziness and giddiness: Secondary | ICD-10-CM

## 2013-01-08 LAB — BASIC METABOLIC PANEL
BUN: 16 mg/dL (ref 6–23)
Calcium: 9.9 mg/dL (ref 8.4–10.5)
Creatinine, Ser: 0.6 mg/dL (ref 0.4–1.2)
GFR: 128.4 mL/min (ref >60.00)

## 2013-01-08 MED ORDER — LABETALOL HCL 100 MG PO TABS: 100.0000 mg | ORAL_TABLET | Freq: Three times a day (TID) | ORAL | Status: DC | PRN

## 2013-01-08 NOTE — Progress Notes (Signed)
HPI The patient was added to my schedule today. She's had a history of mitral valve prolapse with very mild mitral regurgitation in the past. She's had atrial fibrillation that only when she was pregnant in 2008, 2010 and 2012.  However, over the last couple of days she has had increasing palpitations and symptoms consistent with her previous atrial fibrillation. They have lasted from a few minutes up to 45 minutes. They come on at rest. She's not had any presyncope or syncope. She otherwise has felt well. She has also been having some sporadic sharp chest discomfort. This is not necessarily associated with the palpitations. She came today an EKG demonstrated sinus rhythm although she had the arrhythmia this morning.  Allergies  Allergen Reactions  . Avocado Nausea And Vomiting  . Pineapple Hives  . Sesame Oil Hives  . Almond Oil   . Cardamom [Elettaria Cardamomum Minuscula]   . Prochlorperazine Edisylate Other (See Comments)    Patient states that the reaction to the compazine is lockjaw.  . Sulfonamide Derivatives Hives    Current Outpatient Prescriptions  Medication Sig Dispense Refill  . Calcium Carbonate-Vitamin D (CALCIUM + D) 600-200 MG-UNIT TABS Take 4 tablets by mouth daily.      Marland Kitchen EPINEPHrine (EPI-PEN) 0.3 mg/0.3 mL DEVI Inject 0.3 mg into the muscle as needed.      . Multiple Vitamin (MULTIVITAMIN) tablet Take 1 tablet by mouth daily.      . Norethindrone (CAMILA PO) Take 1 tablet by mouth daily. BCP.       No current facility-administered medications for this visit.    Past Medical History  Diagnosis Date  . Broken leg     BROKEN LEG AND HAND AFTER ACCIDENT  . MVP (mitral valve prolapse)     MILD WITH NORMAL LV FX ON ECHO 4/08  . Von Willebrand disease     Hematologist- [facility]-pt states mild and gone during pregnancy  . Hx of atrial fibrillation, no current medication     PAT with pregnancy only-with last 2 pregnancy and currrent-using  labetalol prn  . Upper respiratory infection current    finished Z Pak, new prescription for Augmentum to start today 03/04/11  . GERD (gastroesophageal reflux disease)     Past Surgical History  Procedure Laterality Date  . Tonsillectomy      teenager  . Cesarean section      5  . Hand surgery  2003  . Repair fracture leg  2003  . Cesarean section  03/08/2011    ROS:  As stated in the HPI and negative for all other systems.  PHYSICAL EXAM BP 110/77  Pulse 57  Resp 16  Ht 5\' 1"  (1.549 m)  Wt 116 lb (52.617 kg)  BMI 21.93 kg/m2 GENERAL:  Well appearing HEENT:  Pupils equal round and reactive, fundi not visualized, oral mucosa unremarkable NECK:  No jugular venous distention, waveform within normal limits, carotid upstroke brisk and symmetric, no bruits, no thyromegaly LYMPHATICS:  No cervical, inguinal adenopathy LUNGS:  Clear to auscultation bilaterally BACK:  No CVA tenderness CHEST:  Unremarkable HEART:  PMI not displaced or sustained,S1 and S2 within normal limits, no S3, no S4, no clicks, no rubs, late systolic murmurs ABD:  Flat, positive bowel sounds normal in frequency in pitch, no bruits, no rebound, no guarding, no midline pulsatile mass, no hepatomegaly, no splenomegaly EXT:  2 plus pulses throughout, no edema, no cyanosis no clubbing SKIN:  No rashes no nodules NEURO:  Cranial nerves II through XII grossly intact, motor grossly intact throughout [facility]:  Cognitively intact, oriented to person place and time   EKG:  Sinus rhythm, rate 71, axis within normal limits, intervals within normal limits, no acute ST-T wave changes.  01/08/2013   ASSESSMENT AND PLAN  ATRIAL FIB:  We talked about when necessary dosing of beta blocker. I will check labs to include TSH and electrolytes. She does not need anticoagulation. I will check an echocardiogram as below. She will also need a 48-hour Holter monitor.  MVP:  I will check an echocardiogram for followup of this.  CHEST  PAIN:  This is atypical. I will manage this as above and consider treadmill testing if he continues. Her pretest probability of obstructive coronary disease as an etiology is very low.

## 2013-01-08 NOTE — Telephone Encounter (Signed)
Pt in a-fib since yesterday afternoon, had chest pain last night, was pt of dr wall, pls advise

## 2013-01-08 NOTE — Progress Notes (Signed)
Patient presents ambulatory from lobby in no acute distress; patient ambulates without difficulty.  Patient here for nurse visit EKG; taken to an exam room; states she has been feeling like she was going in and out of a fib since yesterday.  Patient states she currently feels like she is not in a fib but c/o chest pain to left chest that is not worse when she takes a deep breath.  Patient denies SOB; skin warm dry and acyanotic; patient alert and oriented to person, place, time.  12-lead EKG obtained and taken to Dr. Antoine Poche, DOD; EKG shows NSR per Dr. Antoine Poche and patient can take Labetolol 100 mg 1 tab every hour up to 3 per day.  This medication has worked for patient in the past. Patient can be scheduled with first available provider in 7-10 days.  I scheduled patient for 9/16 @ 1030 per her request and verified patient's pharmacy.  Patient asked that I verify that there is no contraindication with Labetolol with her hx of von Willebrand's.  I called Rosey Bath in inpatient pharmacy at [facility] who states there is no contraindication.  I notified patient who then asked what should she do about her chest pain.  Patient states chest pain has not subsided since she is no longer in a fib.  I told patient I would notify Dr. Antoine Poche who advised that he will work her into his schedule this morning.  I advised patient that we are adding her on to Dr. Jenene Slicker schedule.  I verified that patient is comfortable waiting in the lobby for an available exam room in Dr. Jenene Slicker pod and she verbalized agreement with plan of care.

## 2013-01-08 NOTE — Telephone Encounter (Signed)
Spoke with patient who states she has been feeling like she was in afib since yesterday.  Patient states hx of afib but only with 3 previous pregnancies.  Patient states she has never had afib when she was not pregnant and she is not currently pregnant.  Patient does not take any medication for afib.  Patient c/o chest pain onset yesterday, denies SOB.  Patient has hx von Willebrand's and cannot take anticoagulation.  I advised patient to come to the office for an EKG and that we will consult Dr. Antoine Poche, DOD when we have completed the EKG.  Patient verbalized agreement with plan of care and is en route to the office.

## 2013-01-08 NOTE — Progress Notes (Signed)
Patient ID: [name], female   DOB: August 23, 1975, 37 y.o.   MRN: [id number] E-Cardio 48 Hour Holter Monitor applied to patient.

## 2013-01-08 NOTE — Patient Instructions (Addendum)
The current medical regimen is effective;  continue present plan and medications.  Please have blood work today (BMP and TSH)  Your physician has requested that you have an echocardiogram. Echocardiography is a painless test that uses sound waves to create images of your heart. It provides your doctor with information about the size and shape of your heart and how well your heart's chambers and valves are working. This procedure takes approximately one hour. There are no restrictions for this procedure.  Your physician has recommended that you wear a holter monitor. Holter monitors are medical devices that record the heart's electrical activity. Doctors most often use these monitors to diagnose arrhythmias. Arrhythmias are problems with the speed or rhythm of the heartbeat. The monitor is a small, portable device. You can wear one while you do your normal daily activities. This is usually used to diagnose what is causing palpitations/syncope (passing out).  Follow up after testing with Dr Antoine Poche or a NP/PA.

## 2013-01-12 ENCOUNTER — Telehealth: Payer: Self-pay | Admitting: Cardiology

## 2013-01-12 NOTE — Telephone Encounter (Signed)
Pt called to find out about her Holter monitor results. A 48 hours Holter monitor was placed on 01/08/13. Pt would like to know if Dr. Antoine Poche had read the Holter monitor strips, because she does not feel well; according to pt she is experience irregular heart beats, fatigue, chest and neck pain. Pt is aware that Dr. Antoine Poche is not in this week . I will get the strips results with [facility] and get the DOD to read it and let pt know the results as soon as I can. Pt aware.

## 2013-01-12 NOTE — Telephone Encounter (Signed)
Follow Up ° ° ° ° °Pt calling following up on results. °

## 2013-01-13 NOTE — Telephone Encounter (Signed)
Holter monitor results read by Dr. Johney Frame MD. Which reads: "Predominant Rhythms: Sinus with occasional PAC's/ PVC's No arrhythmias during complaint of chest pain, zero  arrhythmias". Pt aware of results. Pt is schedule for a 2 D-Echo on 9/18 and an office visit with Lawson Fiscal gerhardt NP on 01/26/13. pt aware.

## 2013-01-18 ENCOUNTER — Telehealth: Payer: Self-pay

## 2013-01-18 NOTE — Telephone Encounter (Signed)
Patient notified

## 2013-01-18 NOTE — Telephone Encounter (Signed)
Pt called x 2, states that pain has increased this afternoon and would like to check to see if referral approved. Thanks

## 2013-01-18 NOTE — Telephone Encounter (Signed)
Patient called [facility] stating that she was scheduled for appt with our office for Wednesday 9/17 for possible hernia. She reported to urgent care over the weekend and advises that hernia has returned. Per pt, UC recommended that see contact PCP to request referral to surgeon. Thanks

## 2013-01-18 NOTE — Telephone Encounter (Signed)
I have not seen her in a long time and am not comfortable making a referral based on this info

## 2013-01-19 ENCOUNTER — Ambulatory Visit: Payer: BC Managed Care – HMO | Admitting: Cardiology

## 2013-01-19 ENCOUNTER — Ambulatory Visit (INDEPENDENT_AMBULATORY_CARE_PROVIDER_SITE_OTHER): Payer: BC Managed Care – HMO | Admitting: Internal Medicine

## 2013-01-19 ENCOUNTER — Encounter: Payer: Self-pay | Admitting: Internal Medicine

## 2013-01-19 VITALS — BP 118/68 | HR 62 | Temp 98.5°F | Resp 16 | Wt 117.5 lb

## 2013-01-19 DIAGNOSIS — I4891 Unspecified atrial fibrillation: Secondary | ICD-10-CM

## 2013-01-19 DIAGNOSIS — Z23 Encounter for immunization: Secondary | ICD-10-CM

## 2013-01-19 DIAGNOSIS — K429 Umbilical hernia without obstruction or gangrene: Secondary | ICD-10-CM

## 2013-01-19 NOTE — Progress Notes (Signed)
  Subjective:    Patient ID: [name], female    DOB: [birth year], 37 y.o.   MRN: [id number]  HPI  She was seen in an [facility] a few days ago for a painful umbilical hernia, the hernia was reduced and she has not had any pain since then. She wants to know if it can be repaired.  Review of Systems  Constitutional: Negative.  Negative for fever, chills, diaphoresis, activity change, appetite change, fatigue and unexpected weight change.  HENT: Negative.   Eyes: Negative.   Respiratory: Negative.  Negative for cough, chest tightness, shortness of breath, wheezing and stridor.   Cardiovascular: Negative.  Negative for chest pain, palpitations and leg swelling.  Gastrointestinal: Negative.  Negative for nausea, vomiting, abdominal pain, diarrhea, constipation and abdominal distention.  Endocrine: Negative.   Genitourinary: Negative.   Musculoskeletal: Negative.   Skin: Negative.   Allergic/Immunologic: Negative.   Neurological: Negative.   Hematological: Negative.  Negative for adenopathy. Does not bruise/bleed easily.  Psychiatric/Behavioral: Negative.        Objective:   Physical Exam  Vitals reviewed. Constitutional: She is oriented to person, place, and time. She appears well-developed and well-nourished. No distress.  HENT:  Head: Normocephalic and atraumatic.  Mouth/Throat: Oropharynx is clear and moist. No oropharyngeal exudate.  Eyes: Conjunctivae are normal. Right eye exhibits no discharge. Left eye exhibits no discharge. No scleral icterus.  Neck: Normal range of motion. Neck supple. No JVD present. No tracheal deviation present. No thyromegaly present.  Cardiovascular: Normal rate, regular rhythm, normal heart sounds and intact distal pulses.  Exam reveals no gallop and no friction rub.   No murmur heard. Pulmonary/Chest: Effort normal and breath sounds normal. No stridor. No respiratory distress. She has no wheezes. She has no rales. She exhibits no tenderness.  Abdominal:  Soft. Normal appearance and bowel sounds are normal. She exhibits no shifting dullness, no distension, no pulsatile liver, no fluid wave, no abdominal bruit, no ascites, no pulsatile midline mass and no mass. There is no hepatosplenomegaly. There is no tenderness. There is no rebound and no CVA tenderness. A hernia is present. Hernia confirmed positive in the ventral area. Hernia confirmed negative in the right inguinal area and confirmed negative in the left inguinal area.    Musculoskeletal: Normal range of motion. She exhibits no edema and no tenderness.  Lymphadenopathy:    She has no cervical adenopathy.  Neurological: She is oriented to person, place, and time.  Skin: Skin is warm and dry. No rash noted. She is not diaphoretic. No erythema. No pallor.  Psychiatric: She has a normal mood and affect. Her behavior is normal. Judgment and thought content normal.     Lab Results  Component Value Date   WBC 4.4 09/11/2011   HGB 13.7 09/11/2011   HCT 40.5 09/11/2011   PLT 163 09/11/2011   GLUCOSE 75 01/08/2013   ALT 11 10/25/2010   AST 12 10/25/2010   NA 137 01/08/2013   K 4.4 01/08/2013   CL 105 01/08/2013   CREATININE 0.6 01/08/2013   BUN 16 01/08/2013   CO2 27 01/08/2013   TSH 1.47 01/08/2013   INR 1.23 05/25/2009       Assessment & Plan:

## 2013-01-19 NOTE — Patient Instructions (Signed)
Hernia A hernia occurs when an internal organ pushes out through a weak spot in the abdominal wall. Hernias most commonly occur in the groin and around the navel. Hernias often can be pushed back into place (reduced). Most hernias tend to get worse over time. Some abdominal hernias can get stuck in the opening (irreducible or incarcerated hernia) and cannot be reduced. An irreducible abdominal hernia which is tightly squeezed into the opening is at risk for impaired blood supply (strangulated hernia). A strangulated hernia is a medical emergency. Because of the risk for an irreducible or strangulated hernia, surgery may be recommended to repair a hernia. CAUSES   Heavy lifting.  Prolonged coughing.  Straining to have a bowel movement.  A cut (incision) made during an abdominal surgery. HOME CARE INSTRUCTIONS   Bed rest is not required. You may continue your normal activities.  Avoid lifting more than 10 pounds (4.5 kg) or straining.  Cough gently. If you are a smoker it is best to stop. Even the best hernia repair can break down with the continual strain of coughing. Even if you do not have your hernia repaired, a cough will continue to aggravate the problem.  Do not wear anything tight over your hernia. Do not try to keep it in with an outside bandage or truss. These can damage abdominal contents if they are trapped within the hernia sac.  Eat a normal diet.  Avoid constipation. Straining over long periods of time will increase hernia size and encourage breakdown of repairs. If you cannot do this with diet alone, stool softeners may be used. SEEK IMMEDIATE MEDICAL CARE IF:   You have a fever.  You develop increasing abdominal pain.  You feel nauseous or vomit.  Your hernia is stuck outside the abdomen, looks discolored, feels hard, or is tender.  You have any changes in your bowel habits or in the hernia that are unusual for you.  You have increased pain or swelling around the  hernia.  You cannot push the hernia back in place by applying gentle pressure while lying down. MAKE SURE YOU:   Understand these instructions.  Will watch your condition.  Will get help right away if you are not doing well or get worse. Document Released: 04/22/2005 Document Revised: 07/15/2011 Document Reviewed: 12/10/2007 ExitCare Patient Information 2014 [employer].  

## 2013-01-19 NOTE — Assessment & Plan Note (Signed)
GS referral 

## 2013-01-19 NOTE — Assessment & Plan Note (Signed)
She has good rate and rhythm control 

## 2013-01-20 ENCOUNTER — Ambulatory Visit: Payer: BC Managed Care – HMO | Admitting: Internal Medicine

## 2013-01-21 ENCOUNTER — Ambulatory Visit (HOSPITAL_COMMUNITY): Payer: BC Managed Care – HMO | Attending: Cardiology | Admitting: Radiology

## 2013-01-21 DIAGNOSIS — I059 Rheumatic mitral valve disease, unspecified: Secondary | ICD-10-CM

## 2013-01-21 DIAGNOSIS — I341 Nonrheumatic mitral (valve) prolapse: Secondary | ICD-10-CM

## 2013-01-21 DIAGNOSIS — I4891 Unspecified atrial fibrillation: Secondary | ICD-10-CM | POA: Insufficient documentation

## 2013-01-21 NOTE — Progress Notes (Signed)
Echocardiogram performed.  

## 2013-01-25 ENCOUNTER — Encounter: Payer: Self-pay | Admitting: *Deleted

## 2013-01-26 ENCOUNTER — Ambulatory Visit (INDEPENDENT_AMBULATORY_CARE_PROVIDER_SITE_OTHER): Payer: BC Managed Care – HMO | Admitting: Nurse Practitioner

## 2013-01-26 ENCOUNTER — Encounter: Payer: Self-pay | Admitting: Nurse Practitioner

## 2013-01-26 VITALS — BP 110/70 | HR 60 | Ht 61.0 in | Wt 116.8 lb

## 2013-01-26 DIAGNOSIS — R002 Palpitations: Secondary | ICD-10-CM

## 2013-01-26 DIAGNOSIS — R079 Chest pain, unspecified: Secondary | ICD-10-CM

## 2013-01-26 NOTE — Patient Instructions (Addendum)
Stay on your current medicines  We will arrange for a treadmill test  Fasting lipids on day of GXT  Call the [facility] office at [phone] if you have any questions, problems or concerns.

## 2013-01-26 NOTE — Progress Notes (Signed)
[name] Date of Birth: 1975/11/27 Medical Record #[id number]  History of Present Illness: [name] is seen back today for a follow up visit. Seen for Dr. Antoine Poche. She has a history of MVP with very mild MR noted in the past. She has had PAF - mostly when pregnant. Other issues include von Willebrand's and GERD.   Seen earlier this month as a walkin for chest pain and palpitations. EKG did not show AF. Was given Labetolol to use prn - apparently this has worked in the past. Did geta 48 hour Holter which showed sinus with occasional PCCs/PVCs and no significant arrhythmia. Average HR was 73.  Echo was ordered and is as noted below. Has MVP but no MR with a normal EF.   Comes back today. Here alone. Little anxious. Says she is no better. Continues to have chest pain - described as a dull ache and radiates to the left arm - no pattern. Lasts about 5 to 10 minutes. Nothing she can do to bring it on or make it go away. Does not seem positional. Not palpable. Not short of breath. Not really able to exert herself due to an umbilical hernia - actually planning to have a surgical consult later this week for repair. Not able to do any lifting at this time. Palpitations seem to have improved. She does not feel like she is having atrial fib. She has no significant risk factors for CAD. No recent lipids. Does not smoke. No real positive FH. No HTN.    Current Outpatient Prescriptions  Medication Sig Dispense Refill  . Calcium Carbonate-Vitamin D (CALCIUM + D) 600-200 MG-UNIT TABS Take 4 tablets by mouth daily.      Marland Kitchen eletriptan (RELPAX) 40 MG tablet Take 40 mg by mouth as needed for migraine. One tablet by mouth at onset of headache. May repeat in 2 hours if headache persists or recurs.      Marland Kitchen EPINEPHrine (EPI-PEN) 0.3 mg/0.3 mL DEVI Inject 0.3 mg into the muscle as needed.      . labetalol (NORMODYNE) 100 MG tablet Take 1 tablet (100 mg total) by mouth 3 (three) times daily as needed.  90 tablet  10  . LO  LOESTRIN FE 1 MG-10 MCG / 10 MCG tablet Take 1 tablet by mouth daily.       . Multiple Vitamin (MULTIVITAMIN) tablet Take 1 tablet by mouth daily.       No current facility-administered medications for this visit.    Allergies  Allergen Reactions  . Avocado Nausea And Vomiting  . Pineapple Hives  . Sesame Oil Hives  . Almond Oil   . Cardamom [Elettaria Cardamomum Minuscula]   . Prochlorperazine Edisylate Other (See Comments)    Patient states that the reaction to the compazine is lockjaw.  . Sulfonamide Derivatives Hives    Past Medical History  Diagnosis Date  . Broken leg     BROKEN LEG AND HAND AFTER ACCIDENT  . MVP (mitral valve prolapse)     MILD WITH NORMAL LV FX ON ECHO 4/08  . Von Willebrand disease     Hematologist- [facility]-pt states mild and gone during pregnancy  . Hx of atrial fibrillation, no current medication     PAT with pregnancy only-with last 2 pregnancy and currrent-using labetalol prn  . Upper respiratory infection current    finished Z Pak, new prescription for Augmentum to start today 03/04/11  . GERD (gastroesophageal reflux disease)     Past Surgical  History  Procedure Laterality Date  . Tonsillectomy      teenager  . Cesarean section      3  . Hand surgery  2003  . Repair fracture leg  2003  . Cesarean section  03/08/2011    History  Smoking status  . Never Smoker   Smokeless tobacco  . Not on file    History  Alcohol Use No    Family History  Problem Relation Age of Onset  . Hypertension Father     HAD CATH THAT WAS NORMAL  . Other Mother     PALPITATION    Review of Systems: The review of systems is per the HPI.  All other systems were reviewed and are negative.  Physical Exam: BP 110/70  Pulse 60  Ht 5\' 1"  (1.549 m)  Wt 116 lb 12.8 oz (52.98 kg)  BMI 22.08 kg/m2 Patient is alert and in no acute distress. Little anxious. Skin is warm and dry. Color is normal.  HEENT is unremarkable.  Normocephalic/atraumatic. PERRL. Sclera are nonicteric. Neck is supple. No masses. No JVD. Lungs are clear. Cardiac exam shows a regular rate and rhythm. No murmur noted. Abdomen is soft. Extremities are without edema. Gait and ROM are intact. No gross neurologic deficits noted.  LABORATORY DATA: EKG pending.    Lab Results  Component Value Date   WBC 4.4 09/11/2011   HGB 13.7 09/11/2011   HCT 40.5 09/11/2011   PLT 163 09/11/2011   GLUCOSE 75 01/08/2013   ALT 11 10/25/2010   AST 12 10/25/2010   NA 137 01/08/2013   K 4.4 01/08/2013   CL 105 01/08/2013   CREATININE 0.6 01/08/2013   BUN 16 01/08/2013   CO2 27 01/08/2013   TSH 1.47 01/08/2013   INR 1.23 05/25/2009   Echo Study Conclusions  - Left ventricle: The cavity size was normal. Wall thickness was normal. Systolic function was normal. The estimated ejection fraction was in the range of 60% to 65%. Wall motion was normal; there were no regional wall motion abnormalities. Left ventricular diastolic function parameters were normal. - Mitral valve: There is a prolapse and mild thickening of the anterior leaflet of the mitral valve with only trace mitral regurgitation. - Atrial septum: No defect or patent foramen ovale was identified.    Assessment / Plan: 1. Palpitations - seemingly improved. Holter basically negative.   2. Chest pain - will check EKG today. Arrange GXT - I do not think this is cardiac in origin but with ongoing symptoms, will proceed with GXT.   3. Umbilical hernia - very frustrated about her lifting restrictions, etc. Sees the surgeon later this week.   Patient is agreeable to this plan and will call if any problems develop in the interim.   Rosalio Macadamia, RN, ANP-C [facility] [address] [place], [place]  [address]

## 2013-01-27 ENCOUNTER — Other Ambulatory Visit (INDEPENDENT_AMBULATORY_CARE_PROVIDER_SITE_OTHER): Payer: BC Managed Care – HMO

## 2013-01-27 DIAGNOSIS — R079 Chest pain, unspecified: Secondary | ICD-10-CM

## 2013-01-27 DIAGNOSIS — R002 Palpitations: Secondary | ICD-10-CM

## 2013-01-27 LAB — BASIC METABOLIC PANEL
BUN: 13 mg/dL (ref 6–23)
CO2: 27 mEq/L (ref 19–32)
Calcium: 9.4 mg/dL (ref 8.4–10.5)
Chloride: 109 mEq/L (ref 96–112)
Creatinine, Ser: 0.7 mg/dL (ref 0.4–1.2)
GFR: 96.48 mL/min (ref >60.00)
Glucose, Bld: 87 mg/dL (ref 70–99)
Potassium: 4.5 mEq/L (ref 3.5–5.1)
Sodium: 141 mEq/L (ref 135–145)

## 2013-01-27 LAB — LIPID PANEL
Cholesterol: 116 mg/dL (ref 0–200)
HDL: 51.5 mg/dL (ref >39.00)
LDL Cholesterol: 57 mg/dL (ref 0–99)
Total CHOL/HDL Ratio: 2
Triglycerides: 38 mg/dL (ref 0.0–149.0)
VLDL: 7.6 mg/dL (ref 0.0–40.0)

## 2013-01-27 LAB — HEPATIC FUNCTION PANEL
ALT: 16 U/L (ref 0–35)
AST: 14 U/L (ref 0–37)
Albumin: 4.1 g/dL (ref 3.5–5.2)
Alkaline Phosphatase: 25 U/L — ABNORMAL LOW (ref 39–117)
Bilirubin, Direct: 0.2 mg/dL (ref 0.0–0.3)
Total Bilirubin: 1.9 mg/dL — ABNORMAL HIGH (ref 0.3–1.2)
Total Protein: 7.2 g/dL (ref 6.0–8.3)

## 2013-01-28 ENCOUNTER — Ambulatory Visit (INDEPENDENT_AMBULATORY_CARE_PROVIDER_SITE_OTHER): Payer: BC Managed Care – HMO | Admitting: General Surgery

## 2013-01-28 ENCOUNTER — Encounter (INDEPENDENT_AMBULATORY_CARE_PROVIDER_SITE_OTHER): Payer: Self-pay | Admitting: General Surgery

## 2013-01-28 VITALS — BP 96/60 | HR 60 | Temp 99.2°F | Resp 14 | Ht 61.0 in | Wt 116.6 lb

## 2013-01-28 DIAGNOSIS — K420 Umbilical hernia with obstruction, without gangrene: Secondary | ICD-10-CM

## 2013-01-28 NOTE — Progress Notes (Signed)
Patient ID: [name], female   DOB: 03-03-1976, 37 y.o.   MRN: [id number]  Chief Complaint  Patient presents with  . New Evaluation    eval UMB hernia    HPI [name] is a 37 y.o. female.  The patient is a 37 year old female who is referred by Dr. Yetta Barre for evaluation of an umbilical hernia. The patient states that there has been there for approximately 4 years. The patient has had 3 C-sections during this time and has had the umbilicus tacked down however has recurred pregnancy every time. The patient had issues of incarceration recently and was seen at the urgent care Center. Subsequently the patient was referred for evaluation and repair of her umbilicus and hernia.  HPI  Past Medical History  Diagnosis Date  . Broken leg     BROKEN LEG AND HAND AFTER ACCIDENT  . MVP (mitral valve prolapse)     MILD WITH NORMAL LV FX ON ECHO 4/08  . Von Willebrand disease     Hematologist- [facility]-pt states mild and gone during pregnancy  . Hx of atrial fibrillation, no current medication     PAT with pregnancy only-with last 2 pregnancy and currrent-using labetalol prn  . Upper respiratory infection current    finished Z Pak, new prescription for Augmentum to start today 03/04/11  . GERD (gastroesophageal reflux disease)     Past Surgical History  Procedure Laterality Date  . Tonsillectomy      teenager  . Cesarean section      3  . Hand surgery  2003  . Repair fracture leg  2003  . Cesarean section  03/08/2011    Family History  Problem Relation Age of Onset  . Hypertension Father     HAD CATH THAT WAS NORMAL  . Other Mother     PALPITATION  . Cancer Paternal Grandmother     thryoid    Social History History  Substance Use Topics  . Smoking status: Never Smoker   . Smokeless tobacco: Never Used  . Alcohol Use: 0.0 oz/week    1-2 Cans of beer per week     Comment: week    Allergies  Allergen Reactions  . Avocado Nausea And Vomiting  .  Pineapple Hives  . Sesame Oil Hives  . Almond Oil   . Cardamom [Elettaria Cardamomum Minuscula]   . Prochlorperazine Edisylate Other (See Comments)    Patient states that the reaction to the compazine is lockjaw.  . Sulfonamide Derivatives Hives    Current Outpatient Prescriptions  Medication Sig Dispense Refill  . Calcium Carbonate-Vitamin D (CALCIUM + D) 600-200 MG-UNIT TABS Take 4 tablets by mouth daily.      Marland Kitchen eletriptan (RELPAX) 40 MG tablet Take 40 mg by mouth as needed for migraine. One tablet by mouth at onset of headache. May repeat in 2 hours if headache persists or recurs.      Marland Kitchen EPINEPHrine (EPI-PEN) 0.3 mg/0.3 mL DEVI Inject 0.3 mg into the muscle as needed.      . LO LOESTRIN FE 1 MG-10 MCG / 10 MCG tablet Take 1 tablet by mouth daily.       . Multiple Vitamin (MULTIVITAMIN) tablet Take 1 tablet by mouth daily.      Marland Kitchen labetalol (NORMODYNE) 100 MG tablet Take 1 tablet (100 mg total) by mouth 3 (three) times daily as needed.  90 tablet  10   No current facility-administered medications for this visit.  Review of Systems Review of Systems  Constitutional: Negative.   HENT: Negative.   Respiratory: Negative.   Gastrointestinal: Negative.   Neurological: Negative.   All other systems reviewed and are negative.    Blood pressure 96/60, pulse 60, temperature 99.2 F (37.3 C), temperature source Temporal, resp. rate 14, height 5\' 1"  (1.549 m), weight 116 lb 9.6 oz (52.889 kg).  Physical Exam Physical Exam  Constitutional: She is oriented to person, place, and time. She appears well-developed and well-nourished.  HENT:  Head: Normocephalic and atraumatic.  Eyes: Conjunctivae and EOM are normal. Pupils are equal, round, and reactive to light.  Neck: Normal range of motion. Neck supple.  Cardiovascular: Normal rate, regular rhythm and normal heart sounds.   Pulmonary/Chest: Effort normal and breath sounds normal.  Abdominal: Soft. Bowel sounds are normal. There is  tenderness (at umbilicus). A hernia is present.    Musculoskeletal: Normal range of motion.  Neurological: She is alert and oriented to person, place, and time.  Skin: Skin is warm and dry.    Data Reviewed none  Assessment    37 year old female with an incarcerated umbilical hernia. Patient also has a history of some Willebrand's disease for which she had a hematologist, but was released from her care secondary to insignificance of the disease. The patient's had multiple C-sections as well as epidurals without any complications, or need for blood products.     Plan    1. We'll proceed to the operating room for a laparoscopic umbilical hernia repair with mesh 2.All risks and benefits were discussed with the patient, to generally include infection, bleeding, damage to surrounding structures, acute and chronic nerve pain, and recurrence. Alternatives were offered and described.  All questions were answered and the patient voiced understanding of the procedure and wishes to proceed at this point. 3. The patient is to see Dr. Antoine Poche tomorrow for a stress test secondary to her A. Fib that is symptomatic during pregnancy. We will have cardiac evaluation from his prior to scheduling.        Marigene Ehlers., Catalia Massett 01/28/2013, 9:23 AM

## 2013-01-29 ENCOUNTER — Ambulatory Visit (HOSPITAL_COMMUNITY)
Admission: RE | Admit: 2013-01-29 | Discharge: 2013-01-29 | Disposition: A | Discharge status: Home / Self Care | Payer: BC Managed Care – HMO | Source: Ambulatory Visit | Attending: Cardiology | Admitting: Cardiology

## 2013-01-29 ENCOUNTER — Telehealth: Payer: Self-pay | Admitting: Cardiology

## 2013-01-29 DIAGNOSIS — R079 Chest pain, unspecified: Secondary | ICD-10-CM | POA: Insufficient documentation

## 2013-01-29 DIAGNOSIS — R002 Palpitations: Secondary | ICD-10-CM

## 2013-01-29 NOTE — Telephone Encounter (Signed)
Pt aware no results available at this time.

## 2013-01-29 NOTE — Telephone Encounter (Signed)
New Problem ° °Pt calling for results.  °

## 2013-01-29 NOTE — Progress Notes (Signed)
Pt here for GXT. No chest pain or SOB. Had some chest pain yesterday.  M.D. to review the ECG and advise.

## 2013-02-02 NOTE — Telephone Encounter (Signed)
New Problem:  Pt states she would like the results of her stress test

## 2013-02-03 NOTE — Telephone Encounter (Signed)
F/u     Test result

## 2013-02-04 ENCOUNTER — Telehealth: Payer: Self-pay | Admitting: Cardiology

## 2013-02-04 NOTE — Telephone Encounter (Signed)
Follow Up  Pt calling for treadmill stress test results.. Pt cannot schedule for surgery until she receives these results.

## 2013-02-04 NOTE — Telephone Encounter (Signed)
Pt wants results of ETT  Done last week, said she has called last two days for these results, needs them asap.

## 2013-02-05 ENCOUNTER — Telehealth: Payer: Self-pay | Admitting: Cardiology

## 2013-02-05 ENCOUNTER — Telehealth (INDEPENDENT_AMBULATORY_CARE_PROVIDER_SITE_OTHER): Payer: Self-pay | Admitting: General Surgery

## 2013-02-05 NOTE — Telephone Encounter (Signed)
Message copied by June Leap on Fri Feb 05, 2013  4:40 PM ------      Message from: Marchia Bond      Created: Fri Feb 05, 2013  1:20 PM      Regarding: pt surgery       Pt called to see if we have the CC yet to get her surgery scheduled. If so can we get a face sheet sent around.            Thanks,      Denny Peon ------

## 2013-02-05 NOTE — Telephone Encounter (Signed)
Pt aware of results and clearance has been faxed to [facility]

## 2013-02-05 NOTE — Telephone Encounter (Signed)
Called to let pt know that we have not yet received CC from dr.Ochre's office but that I had called over to speak with Rinaldo Cloud the nurse but had to leave a msg...spoke with shontee @4 :30 who stated she would send the nurse a msg...patient is aware of POC at this time and is very grateful that I called her back to let her know this on this Friday afternoon

## 2013-02-05 NOTE — Telephone Encounter (Signed)
New problem:  Pt states she is calling back for the 3rd time for her test results. Pt would like to be called back

## 2013-02-05 NOTE — Telephone Encounter (Signed)
Paperwork faxed again by Medical Records

## 2013-02-05 NOTE — Telephone Encounter (Signed)
Pt aware of results and was very grateful to have been cleared for surgery.  Clearance send electronically and faxed to [facility].

## 2013-02-05 NOTE — Telephone Encounter (Signed)
[facility] says that  the cardiac clearance has been sent several times electronic and through fax, however they have not received the clearance. Their office asks if you can resend the clearance.

## 2013-02-05 NOTE — Telephone Encounter (Signed)
error 

## 2013-02-08 ENCOUNTER — Encounter (INDEPENDENT_AMBULATORY_CARE_PROVIDER_SITE_OTHER): Payer: Self-pay

## 2013-02-08 NOTE — Telephone Encounter (Signed)
LMOM @9 :20 letting pt know that I have received CC from Dr.Hochrien's office and that I would be taking the orders around to the OR schd so they should be contacting her within the day or two...told pt she could call me back if she had not heard anything from them by tomorrow afternoon

## 2013-03-01 ENCOUNTER — Other Ambulatory Visit (INDEPENDENT_AMBULATORY_CARE_PROVIDER_SITE_OTHER): Payer: Self-pay | Admitting: *Deleted

## 2013-03-01 DIAGNOSIS — K420 Umbilical hernia with obstruction, without gangrene: Secondary | ICD-10-CM

## 2013-03-01 MED ORDER — OXYCODONE-ACETAMINOPHEN 5-325 MG PO TABS: 1.0000 | ORAL_TABLET | ORAL | Status: DC | PRN

## 2013-03-05 ENCOUNTER — Telehealth (INDEPENDENT_AMBULATORY_CARE_PROVIDER_SITE_OTHER): Payer: Self-pay | Admitting: General Surgery

## 2013-03-05 NOTE — Telephone Encounter (Signed)
Pt called to report she had surgery for umbilical hernia repair on Monday (this is POD 4) and now has a drop of bright red blood in her umbilicus.  Denies oozing or blood running out of the site.  Recommended she apply ice to the site to vasoconstrict and monitor the site for worsening.  If bleeding persists and worsens, instructed her to go to the ER.  Pt understands.

## 2013-03-08 ENCOUNTER — Encounter (INDEPENDENT_AMBULATORY_CARE_PROVIDER_SITE_OTHER): Payer: Self-pay | Admitting: General Surgery

## 2013-03-08 ENCOUNTER — Ambulatory Visit (INDEPENDENT_AMBULATORY_CARE_PROVIDER_SITE_OTHER): Payer: BC Managed Care – HMO | Admitting: General Surgery

## 2013-03-08 VITALS — BP 102/60 | HR 84 | Temp 98.8°F | Resp 15

## 2013-03-08 DIAGNOSIS — Z4889 Encounter for other specified surgical aftercare: Secondary | ICD-10-CM

## 2013-03-08 DIAGNOSIS — Z5189 Encounter for other specified aftercare: Secondary | ICD-10-CM

## 2013-03-08 MED ORDER — OXYCODONE-ACETAMINOPHEN 5-325 MG PO TABS: 1.0000 | ORAL_TABLET | ORAL | Status: DC | PRN

## 2013-03-08 NOTE — Progress Notes (Signed)
Subjective:     Patient ID: [name], female   DOB: Apr 22, 1976, 37 y.o.   MRN: [id number]  HPI This patient follows up 1 week status post laparoscopicumbilical hernia repair with mesh. She comes in today because she continues to have periumbilical pain. She says that she isn't taking much of her pain medication because she is not able to function during the day and taking care of her children. She denies any fevers or chills or nausea vomiting she says her bowels are functioning normally. She does have small amount of drainage from the umbilicus as well  Review of Systems     Objective:   Physical Exam She appears uncomfortable but is in no acute distress Her abdomen is soft and nondistended she has focal tenderness in the periumbilical region. She has no guarding or peritoneal signs. Her incisions are healing nicely. She does have a small amount of drainage from her umbilical incision but no sign of infection.    Assessment:     Status post laparoscopic umbilical hernia with mesh I do not see any evidence of any postoperative complication. She has periumbilical tenderness which I think could be very appropriate for this stage postoperatively. Doesn't sound as like she is taking her pain medication regularly although I did refill her pain medication today. She denies any peritonitis or distention or evidence of significant hematoma or seroma. Again, I think that this is appropriate for this stage but I would like her to followup sooner that her regularly scheduled followup appointment and we will have her come back in about 3 days for repeat evaluation     Plan:     followup in 3 days Refill Percocet Return sooner if needed

## 2013-03-11 ENCOUNTER — Ambulatory Visit (INDEPENDENT_AMBULATORY_CARE_PROVIDER_SITE_OTHER): Payer: BC Managed Care – HMO | Admitting: General Surgery

## 2013-03-11 ENCOUNTER — Encounter (INDEPENDENT_AMBULATORY_CARE_PROVIDER_SITE_OTHER): Payer: Self-pay | Admitting: General Surgery

## 2013-03-11 VITALS — BP 110/68 | HR 64 | Resp 16 | Ht 61.0 in | Wt 114.4 lb

## 2013-03-11 DIAGNOSIS — Z9889 Other specified postprocedural states: Secondary | ICD-10-CM

## 2013-03-11 MED ORDER — ACETAMINOPHEN-CODEINE #3 300-30 MG PO TABS: 1.0000 | ORAL_TABLET | ORAL | Status: DC | PRN

## 2013-03-11 NOTE — Addendum Note (Signed)
Addended by: Axel Filler on: 03/11/2013 10:23 AM   Modules accepted: Orders

## 2013-03-11 NOTE — Progress Notes (Signed)
Subjective:     Patient ID: [name], female   DOB: 08-03-1975, 37 y.o.   MRN: [id number]  HPI Patient is a 37 year old female status post laparoscopic umbilical hernia repair with mesh. The patient was seen earlier this week secondary to abdominal pain. She states the pain is slowly getting better. She feels like she is pain-free she should have been at this point.   Patient states she still not taking pain medication secondary to dizziness when she takes it and not want to drive her children.  Review of Systems  Constitutional: Negative.   HENT: Negative.   Respiratory: Negative.   Cardiovascular: Negative.   Gastrointestinal: Negative.   Endocrine: Negative.   Neurological: Negative.   All other systems reviewed and are negative.       Objective:   Physical Exam  Constitutional: She is oriented to person, place, and time. She appears well-developed and well-nourished.  HENT:  Head: Normocephalic and atraumatic.  Eyes: Conjunctivae and EOM are normal. Pupils are equal, round, and reactive to light.  Neck: Normal range of motion. Neck supple.  Cardiovascular: Normal rate, regular rhythm and normal heart sounds.   Pulmonary/Chest: Effort normal and breath sounds normal.  Abdominal: Soft. Bowel sounds are normal.  Wounds clean dry and intact No hernia on palpation  Neurological: She is alert and oriented to person, place, and time.  Skin: Skin is warm and dry.       Assessment:     37 year old female status post laparoscopic umbilical hernia repairwith mesh     Plan:     1. We'll have the patient follow back up in 3 weeks. 2. The patient prescription for Tylenol #3 to see if this doesn't make her as dizzy but still with pain. 3. We still discussed with her treatment for a full 6 weeks.

## 2013-03-18 ENCOUNTER — Encounter (INDEPENDENT_AMBULATORY_CARE_PROVIDER_SITE_OTHER): Payer: BC Managed Care – PPO | Admitting: General Surgery

## 2013-04-05 ENCOUNTER — Ambulatory Visit (INDEPENDENT_AMBULATORY_CARE_PROVIDER_SITE_OTHER)
Admission: RE | Admit: 2013-04-05 | Discharge: 2013-04-05 | Disposition: A | Discharge status: Home / Self Care | Payer: BC Managed Care – HMO | Source: Ambulatory Visit | Attending: Internal Medicine | Admitting: Internal Medicine

## 2013-04-05 ENCOUNTER — Encounter: Payer: Self-pay | Admitting: Internal Medicine

## 2013-04-05 ENCOUNTER — Ambulatory Visit (INDEPENDENT_AMBULATORY_CARE_PROVIDER_SITE_OTHER): Payer: BC Managed Care – HMO | Admitting: Internal Medicine

## 2013-04-05 VITALS — BP 112/64 | HR 67 | Temp 98.1°F | Resp 16 | Ht 61.0 in | Wt 117.0 lb

## 2013-04-05 DIAGNOSIS — M25519 Pain in unspecified shoulder: Secondary | ICD-10-CM

## 2013-04-05 DIAGNOSIS — M25512 Pain in left shoulder: Secondary | ICD-10-CM

## 2013-04-05 NOTE — Patient Instructions (Signed)
   Shoulder Pain The shoulder is the joint that connects your arms to your body. The bones that form the shoulder joint include the upper arm bone (humerus), the shoulder blade (scapula), and the collarbone (clavicle). The top of the humerus is shaped like a ball and fits into a rather flat socket on the scapula (glenoid cavity). A combination of muscles and strong, fibrous tissues that connect muscles to bones (tendons) support your shoulder joint and hold the ball in the socket. Small, fluid-filled sacs (bursae) are located in different areas of the joint. They act as cushions between the bones and the overlying soft tissues and help reduce friction between the gliding tendons and the bone as you move your arm. Your shoulder joint allows a wide range of motion in your arm. This range of motion allows you to do things like scratch your back or throw a ball. However, this range of motion also makes your shoulder more prone to pain from overuse and injury. Causes of shoulder pain can originate from both injury and overuse and usually can be grouped in the following four categories:  Redness, swelling, and pain (inflammation) of the tendon (tendinitis) or the bursae (bursitis).  Instability, such as a dislocation of the joint.  Inflammation of the joint (arthritis).  Broken bone (fracture). HOME CARE INSTRUCTIONS   Apply ice to the sore area.  Put ice in a plastic bag.  Place a towel between your skin and the bag.  Leave the ice on for 15-20 minutes, 03-04 times per day for the first 2 days.  Stop using cold packs if they do not help with the pain.  If you have a shoulder sling or immobilizer, wear it as long as your caregiver instructs. Only remove it to shower or bathe. Move your arm as little as possible, but keep your hand moving to prevent swelling.  Squeeze a soft ball or foam pad as much as possible to help prevent swelling.  Only take over-the-counter or prescription medicines for  pain, discomfort, or fever as directed by your caregiver. SEEK MEDICAL CARE IF:   Your shoulder pain increases, or new pain develops in your arm, hand, or fingers.  Your hand or fingers become cold and numb.  Your pain is not relieved with medicines. SEEK IMMEDIATE MEDICAL CARE IF:   Your arm, hand, or fingers are numb or tingling.  Your arm, hand, or fingers are significantly swollen or turn white or blue. MAKE SURE YOU:   Understand these instructions.  Will watch your condition.  Will get help right away if you are not doing well or get worse. Document Released: 01/30/2005 Document Revised: 01/15/2012 Document Reviewed: 04/06/2011 Exit[employer] Patient Information 2014 [employer].  

## 2013-04-05 NOTE — Progress Notes (Signed)
Subjective:    Patient ID: [name], female    DOB: [birth year], 37 y.o.   MRN: [id number]  Shoulder Pain  The pain is present in the left shoulder and left arm. This is a new problem. Episode onset: for about 2 months. There has been no history of extremity trauma (she thinks that this is related to the flu vaccine). The problem occurs intermittently. The problem has been unchanged. The quality of the pain is described as aching. The pain is at a severity of 1/10. The pain is mild. Pertinent negatives include no fever, inability to bear weight, itching, joint locking, joint swelling, numbness, stiffness or tingling. The symptoms are aggravated by activity. She has tried acetaminophen for the symptoms. The treatment provided mild relief.      Review of Systems  Constitutional: Negative.  Negative for fever, chills, diaphoresis, appetite change and fatigue.  HENT: Negative.   Eyes: Negative.   Respiratory: Negative.  Negative for cough, chest tightness, shortness of breath, wheezing and stridor.   Cardiovascular: Negative.  Negative for chest pain, palpitations and leg swelling.  Gastrointestinal: Negative.  Negative for nausea, vomiting, abdominal pain, diarrhea, constipation and blood in stool.  Endocrine: Negative.   Genitourinary: Negative.   Musculoskeletal: Positive for arthralgias. Negative for back pain, gait problem, joint swelling, myalgias, neck pain, neck stiffness and stiffness.  Skin: Negative.  Negative for itching.  Allergic/Immunologic: Negative.   Neurological: Negative.  Negative for dizziness, tingling and numbness.  Hematological: Negative.  Negative for adenopathy. Does not bruise/bleed easily.  Psychiatric/Behavioral: Negative.        Objective:   Physical Exam  Vitals reviewed. Constitutional: She is oriented to person, place, and time. She appears well-developed and well-nourished. No distress.  HENT:  Head: Normocephalic and atraumatic.  Mouth/Throat:  Oropharynx is clear and moist. No oropharyngeal exudate.  Eyes: Conjunctivae are normal. Right eye exhibits no discharge. Left eye exhibits no discharge. No scleral icterus.  Neck: Normal range of motion. Neck supple. No JVD present. No tracheal deviation present. No thyromegaly present.  Cardiovascular: Normal rate, regular rhythm, normal heart sounds and intact distal pulses.  Exam reveals no gallop and no friction rub.   No murmur heard. Pulmonary/Chest: Effort normal and breath sounds normal. No stridor. No respiratory distress. She has no wheezes. She has no rales. She exhibits no tenderness.  Abdominal: Soft. Bowel sounds are normal. She exhibits no distension and no mass. There is no tenderness. There is no rebound and no guarding.  Musculoskeletal: Normal range of motion. She exhibits no edema and no tenderness.       Left shoulder: Normal. She exhibits normal range of motion, no tenderness, no bony tenderness, no swelling, no effusion, no crepitus, no deformity, no laceration, no pain, no spasm, normal pulse and normal strength.       Cervical back: Normal. She exhibits normal range of motion, no tenderness, no bony tenderness, no swelling, no edema, no deformity, no laceration, no pain, no spasm and normal pulse.       Left upper arm: Normal. She exhibits no tenderness, no bony tenderness, no swelling, no edema, no deformity and no laceration.  Lymphadenopathy:    She has no cervical adenopathy.  Neurological: She is alert and oriented to person, place, and time. She has normal reflexes. She displays normal reflexes. No cranial nerve deficit. She exhibits normal muscle tone. Coordination normal.  Skin: Skin is warm and dry. No rash noted. She is not diaphoretic. No erythema. No  pallor.          Assessment & Plan:

## 2013-04-05 NOTE — Assessment & Plan Note (Signed)
I will check a plain film to see if there is a complication from the flu vaccine or any djd, spurring. Etc. 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

## 2013-04-05 NOTE — Progress Notes (Signed)
Pre visit review using our clinic review tool, if applicable. No additional management support is needed unless otherwise documented below in the visit note. 

## 2013-04-06 ENCOUNTER — Telehealth: Payer: Self-pay | Admitting: *Deleted

## 2013-04-06 NOTE — Telephone Encounter (Signed)
Spoke with pt advised of MDs message.  Pt states she will try exercises at home for pian.  She does not want a strong pain medication.

## 2013-04-06 NOTE — Telephone Encounter (Signed)
Left message for pt to return call.

## 2013-04-06 NOTE — Telephone Encounter (Signed)
Tylenol, PT are the first options - is she willing to do PT? Does she feel like needs a pain med stronger than tylenol?  TJ

## 2013-04-06 NOTE — Telephone Encounter (Signed)
Spoke with pt advised of result email.  Pt requests what she is to do with the pain.  Please advise

## 2013-04-08 ENCOUNTER — Ambulatory Visit (INDEPENDENT_AMBULATORY_CARE_PROVIDER_SITE_OTHER): Payer: BC Managed Care – PPO | Admitting: General Surgery

## 2013-04-08 ENCOUNTER — Encounter (INDEPENDENT_AMBULATORY_CARE_PROVIDER_SITE_OTHER): Payer: Self-pay | Admitting: General Surgery

## 2013-04-08 VITALS — BP 110/66 | HR 68 | Temp 98.9°F | Resp 14 | Ht 61.0 in | Wt 115.0 lb

## 2013-04-08 DIAGNOSIS — Z9889 Other specified postprocedural states: Secondary | ICD-10-CM

## 2013-04-08 NOTE — Progress Notes (Signed)
Patient ID: HENRITTA MUTZ, female   DOB: [birth year], 37 y.o.   MRN: [id number] Post op course The patient is a 37 year old who is status post laparoscopic umbilical hernia repair. She's been doing very well since her last visit. Her previous pain hasn't resolved at this time. She has been away from any heavy lifting.  On Exam: Her wounds are clean dry and packed. There is no hernia on palpation  Assessment and Plan 37 year old female status post laparoscopic cholecystectomy hernia repair. 1. Patient can resume normal activities. 2. Patient followup as an   Axel Filler, MD [facility] General & Minimally Invasive Surgery Trauma & Emergency Surgery

## 2013-06-02 ENCOUNTER — Encounter: Payer: Self-pay | Admitting: Family Medicine

## 2013-06-02 ENCOUNTER — Ambulatory Visit (INDEPENDENT_AMBULATORY_CARE_PROVIDER_SITE_OTHER): Payer: BC Managed Care – HMO | Admitting: Family Medicine

## 2013-06-02 VITALS — BP 110/60 | HR 67 | Temp 98.3°F | Resp 16 | Wt 115.4 lb

## 2013-06-02 DIAGNOSIS — N390 Urinary tract infection, site not specified: Secondary | ICD-10-CM

## 2013-06-02 DIAGNOSIS — R350 Frequency of micturition: Secondary | ICD-10-CM

## 2013-06-02 LAB — POCT URINALYSIS DIPSTICK
Bilirubin, UA: NEGATIVE
GLUCOSE UA: NEGATIVE
Ketones, UA: NEGATIVE
Leukocytes, UA: NEGATIVE
Nitrite, UA: NEGATIVE
PROTEIN UA: NEGATIVE
Spec Grav, UA: 1.02
UROBILINOGEN UA: 0.2
pH, UA: 6

## 2013-06-02 MED ORDER — FLUCONAZOLE 150 MG PO TABS: 150.0000 mg | ORAL_TABLET | Freq: Once | ORAL | Status: DC

## 2013-06-02 MED ORDER — CEPHALEXIN 500 MG PO CAPS: 500.0000 mg | ORAL_CAPSULE | Freq: Two times a day (BID) | ORAL | Status: DC

## 2013-06-02 NOTE — Progress Notes (Signed)
Pre-visit discussion using our clinic review tool. No additional management support is needed unless otherwise documented below in the visit note.  

## 2013-06-02 NOTE — Progress Notes (Signed)
SUBJECTIVE: Bob Rondel BatonS Esch is a 38 y.o. female who complains of urinary frequency, urgency and dysuria x 2 days, without flank pain, fever, chills, or abnormal vaginal discharge or bleeding. Hx of UTi > 2 years ago. No new medications. Irregular periods and not on cycle at present moment.   Denies fever, chills, nausea vomiting abdominal pain, dysuria, chest pain, shortness of breath dyspnea on exertion or numbness in extremities  Past medical history, social, surgical and family history all reviewed.    Blood pressure 110/60, pulse 67, temperature 98.3 F (36.8 C), temperature source Oral, resp. rate 16, weight 115 lb 6.4 oz (52.345 kg), SpO2 97.00%.   OBJECTIVE: Appears well, in no apparent distress.  Vital signs are normal. The abdomen is soft without tenderness, guarding, mass, rebound or organomegaly. No CVA tenderness or inguinal adenopathy noted. Urine dipstick shows positive for RBC's.  Micro exam: not done.   ASSESSMENT: UTI uncomplicated without evidence of pyelonephritis  PLAN: Treatment per orders - also push fluids, may use Pyridium OTC prn. Call or return to clinic prn if these symptoms worsen or fail to improve as anticipated.

## 2013-06-02 NOTE — Patient Instructions (Signed)
Nice to meet you Kelfex 2 times daily for next week If yeast infection occurs take diflucan If not better ion 1 week come back.  Urinary Tract Infection Urinary tract infections (UTIs) can develop anywhere along your urinary tract. Your urinary tract is your body's drainage system for removing wastes and extra water. Your urinary tract includes two kidneys, two ureters, a bladder, and a urethra. Your kidneys are a pair of bean-shaped organs. Each kidney is about the size of your fist. They are located below your ribs, one on each side of your spine. CAUSES Infections are caused by microbes, which are microscopic organisms, including fungi, viruses, and bacteria. These organisms are so small that they can only be seen through a microscope. Bacteria are the microbes that most commonly cause UTIs. SYMPTOMS  Symptoms of UTIs may vary by age and gender of the patient and by the location of the infection. Symptoms in young women typically include a frequent and intense urge to urinate and a painful, burning feeling in the bladder or urethra during urination. Older women and men are more likely to be tired, shaky, and weak and have muscle aches and abdominal pain. A fever may mean the infection is in your kidneys. Other symptoms of a kidney infection include pain in your back or sides below the ribs, nausea, and vomiting. DIAGNOSIS To diagnose a UTI, your caregiver will ask you about your symptoms. Your caregiver also will ask to provide a urine sample. The urine sample will be tested for bacteria and white blood cells. White blood cells are made by your body to help fight infection. TREATMENT  Typically, UTIs can be treated with medication. Because most UTIs are caused by a bacterial infection, they usually can be treated with the use of antibiotics. The choice of antibiotic and length of treatment depend on your symptoms and the type of bacteria causing your infection. HOME CARE INSTRUCTIONS  If you were  prescribed antibiotics, take them exactly as your caregiver instructs you. Finish the medication even if you feel better after you have only taken some of the medication.  Drink enough water and fluids to keep your urine clear or pale yellow.  Avoid caffeine, tea, and carbonated beverages. They tend to irritate your bladder.  Empty your bladder often. Avoid holding urine for long periods of time.  Empty your bladder before and after sexual intercourse.  After a bowel movement, women should cleanse from front to back. Use each tissue only once. SEEK MEDICAL CARE IF:   You have back pain.  You develop a fever.  Your symptoms do not begin to resolve within 3 days. SEEK IMMEDIATE MEDICAL CARE IF:   You have severe back pain or lower abdominal pain.  You develop chills.  You have nausea or vomiting.  You have continued burning or discomfort with urination. MAKE SURE YOU:   Understand these instructions.  Will watch your condition.  Will get help right away if you are not doing well or get worse. Document Released: 01/30/2005 Document Revised: 10/22/2011 Document Reviewed: 05/31/2011 [facility] Patient Information 2014 [employer] [place].

## 2013-10-20 ENCOUNTER — Other Ambulatory Visit (INDEPENDENT_AMBULATORY_CARE_PROVIDER_SITE_OTHER): Payer: BC Managed Care – HMO

## 2013-10-20 ENCOUNTER — Encounter: Payer: Self-pay | Admitting: Internal Medicine

## 2013-10-20 ENCOUNTER — Ambulatory Visit (INDEPENDENT_AMBULATORY_CARE_PROVIDER_SITE_OTHER): Payer: BC Managed Care – HMO | Admitting: Internal Medicine

## 2013-10-20 VITALS — BP 98/70 | HR 62 | Temp 98.1°F | Wt 117.6 lb

## 2013-10-20 DIAGNOSIS — R002 Palpitations: Secondary | ICD-10-CM

## 2013-10-20 DIAGNOSIS — R42 Dizziness and giddiness: Secondary | ICD-10-CM

## 2013-10-20 DIAGNOSIS — R51 Headache: Secondary | ICD-10-CM

## 2013-10-20 DIAGNOSIS — R110 Nausea: Secondary | ICD-10-CM

## 2013-10-20 DIAGNOSIS — R112 Nausea with vomiting, unspecified: Secondary | ICD-10-CM

## 2013-10-20 LAB — CBC WITH DIFFERENTIAL/PLATELET
BASOS ABS: 0 10*3/uL (ref 0.0–0.1)
Basophils Relative: 0.4 % (ref 0.0–3.0)
EOS PCT: 1.6 % (ref 0.0–5.0)
Eosinophils Absolute: 0.1 10*3/uL (ref 0.0–0.7)
HEMATOCRIT: 42.3 % (ref 36.0–46.0)
HEMOGLOBIN: 14.2 g/dL (ref 12.0–15.0)
LYMPHS ABS: 2.7 10*3/uL (ref 0.7–4.0)
LYMPHS PCT: 35.9 % (ref 12.0–46.0)
MCHC: 33.7 g/dL (ref 30.0–36.0)
MCV: 90.1 fl (ref 78.0–100.0)
MONOS PCT: 5.4 % (ref 3.0–12.0)
Monocytes Absolute: 0.4 10*3/uL (ref 0.1–1.0)
Neutro Abs: 4.2 10*3/uL (ref 1.4–7.7)
Neutrophils Relative %: 56.7 % (ref 43.0–77.0)
Platelets: 202 10*3/uL (ref 150.0–400.0)
RBC: 4.69 Mil/uL (ref 3.87–5.11)
RDW: 12.5 % (ref 11.5–15.5)
WBC: 7.4 10*3/uL (ref 4.0–10.5)

## 2013-10-20 MED ORDER — ONDANSETRON HCL 4 MG PO TABS: 4.0000 mg | ORAL_TABLET | Freq: Three times a day (TID) | ORAL | Status: DC | PRN

## 2013-10-20 NOTE — Progress Notes (Signed)
Pre visit review using our clinic review tool, if applicable. No additional management support is needed unless otherwise documented below in the visit note. 

## 2013-10-20 NOTE — Progress Notes (Signed)
   Subjective:    Patient ID: Carmelia BakeAmy S Pistole, female    DOB: Aug 27, 1975, 38 y.o.   MRN: [id number]  HPI Pt's symptoms began 10/15/13. The pt was asymptomatic up until 10/15/13 PM at which point she became dizzy, nauseous, weak, had palpitations and a stiff neck. She also reported a dull headache that has persisted. The pt was able to go to sleep and woke up Saturday with the same symptoms. The symptoms lasted all day on Saturday and pt reports she was on the couch most of the day. She did vomit x2 on Saturday but reports she was able to keep dinner down. Again the pt was able to sleep Saturday night and woke up feeling better on Sunday.   Since Sunday 10/17/13 the pt has had intermittent symptoms as mentioned above with the steady and chronic headache. She is unable to state how many times a day these symptoms occur. She also does not believe the symptoms are related to position changes or activity. She does note that her symptoms subside whenever she eats food. Pt reports a fever yesterday of 100.5.  During these symptoms the pt specifically denies visual disturbances, tinnitus, shortness of breath, chest pain, numbness or tingling.    No sick exposures, though her infant did have a fever of 102 last week. No tick exposures to her knowledge.   Review of Systems  HENT: Negative for sinus pressure.   Respiratory: Negative for cough and shortness of breath.   Cardiovascular: Negative for chest pain.  Gastrointestinal: Negative for abdominal pain, diarrhea, constipation and blood in stool.  Genitourinary: Positive for hematuria. Negative for dysuria.  Neurological: Positive for headaches.       Pt has history of migraines, though this headache has been fairly constant      Objective:   Physical Exam  Abdominal striae otherwise normal. See Dr. Frederik PearHopper's       Assessment & Plan:  #1 dizziness: check CBC, TSH, BMP for glucose

## 2013-10-20 NOTE — Progress Notes (Signed)
Subjective:    Patient ID: [name], female    DOB: 1976-04-14, 38 y.o.   MRN: [id number]  HPI The pt was asymptomatic up until the evening of 10/15/13 when she became dizzy, nauseous, weak, had palpitations and a stiff neck. She also reported a dull, R sided  headache that has persisted. The pt was able to go to sleep and woke up Saturday with the same symptoms. The symptoms lasted all day on Saturday and pt reports she was on the couch most of the day. She did vomit x2 on Saturday but reports she was able to keep dinner down. Again the pt was able to sleep Saturday night and woke up feeling better on Sunday.   Since Sunday 10/17/13 the pt has had intermittent symptoms as mentioned above with the steady and chronic headache. She is unable to state how many times a day these symptoms occur. She also does not believe the symptoms are related to position changes or activity. She does note that her symptoms subside whenever she eats food. Pt reports a fever yesterday of 100.5.   During these symptoms the pt specifically denies visual disturbances, tinnitus, shortness of breath, chest pain, numbness or tingling.  Her 36 year old did have a fever of 102 last week.  All symptoms subjectively improved today.  Pt has history of migraines,  No tick exposures to her knowledge.    Review of Systems HENT: Negative for sinus pressure or nasal purulence.  Respiratory: Negative for cough and shortness of breath.  Cardiovascular: Negative for chest pain.  Gastrointestinal: Negative for abdominal pain, diarrhea, constipation and blood in stool.  Genitourinary: Positive for hematuria. Negative for dysuria.  Neurological: Positive for headaches.       Objective:   Physical Exam Positive findings included wax in right otic canal. She has an epidermoid inclusion cyst of the right posterior thorax Striae and laxity of the abdominal skin present.  Gen.: Thin but healthy and well-nourished in  appearance. Alert, appropriate and cooperative throughout exam. Appears younger than stated age  Head: Normocephalic without obvious abnormalities. Eyes: No corneal or conjunctival inflammation noted. Pupils equal round reactive to light and accommodation. Extraocular motion intact. Field of  Vision grossly normal No scleral icterus. Ears: External  ear exam reveals no significant lesions or deformities. Canals clear . Hearing is grossly normal bilaterally. Nose: External nasal exam reveals no deformity or inflammation. Nasal mucosa are pink and moist. No lesions or exudates noted.   Mouth: Oral mucosa and oropharynx reveal no lesions or exudates. Teeth in good repair. Neck: No deformities, masses, or tenderness noted. Range of motion excellent Supple w/o meningismus. Thyroid normal. Lungs: Normal respiratory effort; chest expands symmetrically. Lungs are clear to auscultation without rales, wheezes, or increased work of breathing. Heart: Normal rate and rhythm. Normal S1 and S2. No gallop, click, or rub. No murmur. Abdomen: Bowel sounds normal; abdomen soft and nontender. No masses, organomegaly or hernias noted.                               Musculoskeletal/extremities: No deformity or scoliosis noted of  the thoracic or lumbar spine.  No clubbing, cyanosis, edema, or significant extremity  deformity noted. Range of motion normal .Tone & strength normal. Hand joints normal  Fingernail / toenail health good. Able to lie down & sit up w/o help. Negative SLR bilaterally w/o meningeal signs. Vascular: Carotid, radial artery, dorsalis pedis  and  posterior tibial pulses are full and equal. No bruits present. Neurologic: Alert and oriented x3. Deep tendon reflexes symmetrical and normal.  Gait normal  including heel & toe walking . Rhomberg & finger to nose negative     Skin: Intact without suspicious lesions or rashes. Lymph: No cervical, axillary lymphadenopathy present. Psych: Mood and affect are  normal. Normally interactive                                                                                        Assessment & Plan:  #1 dizziness; no neurologic signs on exam  #2 nausea & vomiting  3 subjective cardiac rhythm irregularity  #4 right frontal headache without symptoms of sinusitis. No evidence of meningitis clinically. PMH headaches.  See orders and recommendations

## 2013-10-20 NOTE — Patient Instructions (Addendum)
Stay on clear liquids for 24-48 hours or until symptoms resolved.This would include  jello, sherbert (NOT ice cream), Lipton's chicken noodle soup(NOT cream based soups),Gatorade Lite, flat Ginger ale (without High Fructose Corn Syrup),dry toast or crackers, baked potato.No milk , dairy or grease until stable 48 hours.

## 2013-10-21 LAB — BASIC METABOLIC PANEL
BUN: 13 mg/dL (ref 6–23)
CALCIUM: 9.7 mg/dL (ref 8.4–10.5)
CO2: 32 mEq/L (ref 19–32)
Chloride: 104 mEq/L (ref 96–112)
Creatinine, Ser: 0.7 mg/dL (ref 0.4–1.2)
GFR: 102.53 mL/min (ref >60.00)
Glucose, Bld: 90 mg/dL (ref 70–99)
Potassium: 4.2 mEq/L (ref 3.5–5.1)
SODIUM: 139 meq/L (ref 135–145)

## 2013-10-21 LAB — SEDIMENTATION RATE: Sed Rate: 3 mm/hr (ref 0–22)

## 2013-10-21 LAB — HEPATIC FUNCTION PANEL
ALK PHOS: 29 U/L — AB (ref 39–117)
ALT: 13 U/L (ref 0–35)
AST: 16 U/L (ref 0–37)
Albumin: 4.6 g/dL (ref 3.5–5.2)
BILIRUBIN TOTAL: 2.7 mg/dL — AB (ref 0.2–1.2)
Bilirubin, Direct: 0.3 mg/dL (ref 0.0–0.3)
Total Protein: 7.6 g/dL (ref 6.0–8.3)

## 2013-10-21 LAB — TSH: TSH: 1.05 u[IU]/mL (ref 0.35–4.50)

## 2013-10-22 ENCOUNTER — Telehealth: Payer: Self-pay

## 2013-10-22 NOTE — Telephone Encounter (Signed)
Patient has been advised

## 2013-10-22 NOTE — Telephone Encounter (Signed)
Phone call from patient stating she saw Dr Alwyn RenHopper on 10/20/13 and she is requesting test results. Dr Alwyn RenHopper is out of the office. Will you advise on results?

## 2013-10-22 NOTE — Telephone Encounter (Signed)
Her albumin is low but the other labs are okay

## 2014-02-18 ENCOUNTER — Other Ambulatory Visit: Payer: Self-pay

## 2014-03-07 ENCOUNTER — Encounter: Payer: Self-pay | Admitting: Internal Medicine

## 2014-04-23 ENCOUNTER — Encounter: Payer: Self-pay | Admitting: Family Medicine

## 2014-04-23 ENCOUNTER — Ambulatory Visit (INDEPENDENT_AMBULATORY_CARE_PROVIDER_SITE_OTHER): Payer: BC Managed Care – PPO | Admitting: Family Medicine

## 2014-04-23 VITALS — BP 98/62 | HR 78 | Temp 98.5°F | Wt 121.1 lb

## 2014-04-23 DIAGNOSIS — R42 Dizziness and giddiness: Secondary | ICD-10-CM

## 2014-04-23 NOTE — Progress Notes (Signed)
Pre visit review using our clinic review tool, if applicable. No additional management support is needed unless otherwise documented below in the visit note. 

## 2014-04-23 NOTE — Progress Notes (Signed)
   Subjective:    Patient ID: [name], female    DOB: 1975/10/18, 38 y.o.   MRN: [id number]  HPI Here to follow up on dizzy spells that she had yesterday. She had four separate episodes yesterday throughout the day which started suddenly and lasted about 2-5 minutes each. These made it feel like she was spinning in circles, she felt a bit weak, and she saw spots in her vision. She has migraines but she did not have one yesterday. No nausea or vomiting. No slurred speech or any other neurologic deficits. She had some similar spells in June but her workup, including labs, was negative at that time.    Review of Systems  Constitutional: Negative.   HENT: Negative.   Eyes: Positive for visual disturbance. Negative for photophobia, pain, discharge, redness and itching.  Respiratory: Negative.   Cardiovascular: Negative.   Neurological: Positive for dizziness. Negative for tremors, seizures, syncope, facial asymmetry, speech difficulty, weakness, light-headedness, numbness and headaches.       Objective:   Physical Exam  Constitutional: She is oriented to person, place, and time. She appears well-developed and well-nourished. No distress.  HENT:  Head: Normocephalic and atraumatic.  Right Ear: External ear normal.  Nose: Nose normal.  Mouth/Throat: Oropharynx is clear and moist.  Eyes: Conjunctivae and EOM are normal. Pupils are equal, round, and reactive to light.  Neck: Normal range of motion. Neck supple. No thyromegaly present.  Cardiovascular: Normal rate, regular rhythm, normal heart sounds and intact distal pulses.   Pulmonary/Chest: Effort normal and breath sounds normal.  Lymphadenopathy:    She has no cervical adenopathy.  Neurological: She is alert and oriented to person, place, and time. She has normal reflexes. No cranial nerve deficit. She exhibits normal muscle tone. Coordination normal.          Assessment & Plan:  These spells are probably the result of benign  positional vertigo, but we will set her up for a CT scan of the head early next week to evaluate further. She seems fine today. She will follow up prn

## 2014-04-25 ENCOUNTER — Telehealth: Payer: Self-pay | Admitting: *Deleted

## 2014-04-25 ENCOUNTER — Telehealth: Payer: Self-pay | Admitting: Internal Medicine

## 2014-04-25 DIAGNOSIS — R42 Dizziness and giddiness: Secondary | ICD-10-CM

## 2014-04-25 NOTE — Telephone Encounter (Signed)
Pt was seen on 04/23/14 and was told she would be getting Mri and ct scan is in system. Please confirm

## 2014-04-25 NOTE — Telephone Encounter (Signed)
[facility] - Client TELEPHONE ADVICE RECORD [facility] Patient Name: [name] Gender: Female DOB: [birth year] Age: [age] Y 1 M 18 D Return Phone Number: [phone] (Primary) Address: [address] [place] [id number] Client [facility] - Client Client Site [facility] Day Physician Sanda LingerJones, Thomas Contact Type Call Call Type Triage / Clinical Relationship To Patient Self Return Phone Number [phone] (Primary) Chief Complaint Dizziness Initial Comment Caller says that she has been getting bried dizzy spells sometimes even when she is driving. They go away if she takes a sip of water PreDisposition Did not know what to do Nurse Assessment Nurse: Roderic OvensNorth, RN, Ariane Date/Time (Eastern Time): 04/22/2014 2:01:09 PM Confirm and document reason for call. If symptomatic, describe symptoms. ---CALLER STATES THAT SHE IS HAVING SOME DIZZY EPISODES OF THIS WEEK AND THAT SHE HAD ONE WHILE SHE WAS DRIVING AND THAT SHE HAD PULL OFF THE ROAD AND DRINK SOMETHING AND THEN IT GOES AWAY. SHE WILL DRINK A COUPLE OF SIPS AND THEN IT SEEMS TO GO AWAY. SHE STATES THAT SHE DRINKS ENOUGH THROUGHOUT THE DAY. SHE STATES THAT IT HAPPENS ABOUT 3-4 TIMES IN A DAY. SHE STATES THAT AFTER SHE DRINKS IT TAKES ABOUT 5 MINUTES FOR HER TO GET HERSELF TOGETHER AND THEN SHE PROCEEDED TO DRIVE. HER CHILDREN WERE IN THE CAR. SHE STATES HER BP IS LOW ALL THE TIME. Has the patient traveled out of the country within the last 30 days? ---Not Applicable Does the patient require triage? ---Yes Related visit to physician within the last 2 weeks? ---No Does the PT have any chronic conditions? (i.e. diabetes, asthma, etc.) ---Yes List chronic conditions. ---CLOTTING DISORDER, PROVALVE PROLAPSE Did the patient indicate they were pregnant? ---No Guidelines Guideline Title Affirmed Question Affirmed Notes Nurse Date/Time  (Eastern Time) Dizziness - Vertigo SEVERE dizziness (vertigo) (e.g., unable to walk without assistance) [place]th, Charity fundraiserN, Rane 04/22/2014 2:02:13 PM PLEASE NOTE: All timestamps contained within this report are represented as [place] Standard Time. CONFIDENTIALTY NOTICE: This fax transmission is intended only for the addressee. It contains information that is legally privileged, confidential or otherwise protected from use or disclosure. If you are not the intended recipient, you are strictly prohibited from reviewing, disclosing, copying using or disseminating any of this information or taking any action in reliance on or regarding this information. If you have received this fax in error, please notify us immediately by telephone so that we can arrange for its return to us. Phone: [phone], Toll-Free: [phone], Fax: [phone] Page: 2 of 2 Call Id: [id number] Disp. Time Lamount Cohen(Eastern Time) Disposition Final User 04/22/2014 2:06:09 PM Go to ED Now (or PCP triage) Yes Roderic OvensNorth, RN, Tabathia Caller Understands: Yes Disagree/Comply: Comply Care Advice Given Per Guideline GO TO ED NOW (OR PCP TRIAGE): CARE ADVICE given per Dizziness - Vertigo (Adult) guideline. After Care Instructions Given Call Event Type User Date / Time Description Comments User: Brent BullaAmy, North, RN Date/Time Lamount Cohen(Eastern Time): 04/22/2014 2:11:29 PM WARM TRANSFERRED THE CALLER TO THE CLINIC FOR APPT TIME. IF THEY ARE UNABLE TO GET HER AN APPT TODAY, SHE WILL BE ADVISED TO GO INTO URGENT CARE OR TO THE ED. Referrals REFERRED TO PCP OFFICE

## 2014-04-25 NOTE — Telephone Encounter (Signed)
I intended to order only a CT of the head, and she does have an order pending in the system. I did not intend to order an MRI

## 2014-04-26 NOTE — Telephone Encounter (Signed)
Pt called her at [facility] as well with the same questions.  She wants to know the difference between the MRI and CT.  She called Dr Clent RidgesFry office as well requesting the same info.  She is requesting a call back from someone to explain

## 2014-04-26 NOTE — Telephone Encounter (Signed)
Please cancel the order for the head CT and get the MRI instead, thanks

## 2014-04-26 NOTE — Telephone Encounter (Signed)
Patient was advised of the below response and wants to know why Dr. Clent RidgesFry changed to CT.  She states he told her a MRI and says CT has more radiation, from what she has read.  She then questioned the location of where she would go, said [facility] was [facility].  Please advise.

## 2014-05-17 ENCOUNTER — Ambulatory Visit (HOSPITAL_COMMUNITY)
Admission: RE | Admit: 2014-05-17 | Discharge: 2014-05-17 | Disposition: A | Discharge status: Home / Self Care | Payer: BLUE CROSS/BLUE SHIELD | Source: Ambulatory Visit | Attending: Family Medicine | Admitting: Family Medicine

## 2014-05-17 DIAGNOSIS — R42 Dizziness and giddiness: Secondary | ICD-10-CM | POA: Insufficient documentation

## 2014-05-17 MED ORDER — GADOBENATE DIMEGLUMINE 529 MG/ML IV SOLN
10.0000 mL | Freq: Once | INTRAVENOUS | Status: AC
Start: 2014-05-17 — End: 2014-05-17
  Administered 2014-05-17: 10 mL via INTRAVENOUS

## 2014-12-05 ENCOUNTER — Other Ambulatory Visit (INDEPENDENT_AMBULATORY_CARE_PROVIDER_SITE_OTHER): Payer: BLUE CROSS/BLUE SHIELD

## 2014-12-05 ENCOUNTER — Encounter: Payer: Self-pay | Admitting: Internal Medicine

## 2014-12-05 ENCOUNTER — Ambulatory Visit (INDEPENDENT_AMBULATORY_CARE_PROVIDER_SITE_OTHER): Payer: BLUE CROSS/BLUE SHIELD | Admitting: Internal Medicine

## 2014-12-05 VITALS — BP 108/68 | HR 90 | Temp 98.7°F | Resp 16 | Ht 61.0 in | Wt 119.2 lb

## 2014-12-05 DIAGNOSIS — R10814 Left lower quadrant abdominal tenderness: Secondary | ICD-10-CM | POA: Diagnosis not present

## 2014-12-05 LAB — COMPREHENSIVE METABOLIC PANEL
ALBUMIN: 4.3 g/dL (ref 3.5–5.2)
ALT: 23 U/L (ref 0–35)
AST: 18 U/L (ref 0–37)
Alkaline Phosphatase: 36 U/L — ABNORMAL LOW (ref 39–117)
BUN: 22 mg/dL (ref 6–23)
CHLORIDE: 102 meq/L (ref 96–112)
CO2: 25 mEq/L (ref 19–32)
Calcium: 10.4 mg/dL (ref 8.4–10.5)
Creatinine, Ser: 0.84 mg/dL (ref 0.40–1.20)
GFR: 81.2 mL/min (ref >60.00)
Glucose, Bld: 85 mg/dL (ref 70–99)
POTASSIUM: 4.8 meq/L (ref 3.5–5.1)
SODIUM: 138 meq/L (ref 135–145)
TOTAL PROTEIN: 7.5 g/dL (ref 6.0–8.3)
Total Bilirubin: 1 mg/dL (ref 0.2–1.2)

## 2014-12-05 LAB — URINALYSIS, ROUTINE W REFLEX MICROSCOPIC
BILIRUBIN URINE: NEGATIVE
Hgb urine dipstick: NEGATIVE
Ketones, ur: NEGATIVE
Leukocytes, UA: NEGATIVE
Nitrite: NEGATIVE
PH: 6.5 (ref 5.0–8.0)
RBC / HPF: NONE SEEN (ref 0–?)
TOTAL PROTEIN, URINE-UPE24: NEGATIVE
URINE GLUCOSE: NEGATIVE
Urobilinogen, UA: 0.2 (ref 0.0–1.0)
WBC UA: NONE SEEN (ref 0–?)

## 2014-12-05 LAB — CBC WITH DIFFERENTIAL/PLATELET
BASOS ABS: 0 10*3/uL (ref 0.0–0.1)
Basophils Relative: 0.3 % (ref 0.0–3.0)
EOS PCT: 1.4 % (ref 0.0–5.0)
Eosinophils Absolute: 0.1 10*3/uL (ref 0.0–0.7)
HCT: 43.5 % (ref 36.0–46.0)
Hemoglobin: 15.1 g/dL — ABNORMAL HIGH (ref 12.0–15.0)
LYMPHS ABS: 2 10*3/uL (ref 0.7–4.0)
Lymphocytes Relative: 32.7 % (ref 12.0–46.0)
MCHC: 34.8 g/dL (ref 30.0–36.0)
MCV: 90 fl (ref 78.0–100.0)
Monocytes Absolute: 0.4 10*3/uL (ref 0.1–1.0)
Monocytes Relative: 6.2 % (ref 3.0–12.0)
Neutro Abs: 3.7 10*3/uL (ref 1.4–7.7)
Neutrophils Relative %: 59.4 % (ref 43.0–77.0)
Platelets: 223 10*3/uL (ref 150.0–400.0)
RBC: 4.84 Mil/uL (ref 3.87–5.11)
RDW: 13 % (ref 11.5–15.5)
WBC: 6.2 10*3/uL (ref 4.0–10.5)

## 2014-12-05 LAB — HCG, QUANTITATIVE, PREGNANCY: QUANTITATIVE HCG: 0 m[IU]/mL

## 2014-12-05 NOTE — Progress Notes (Signed)
Pre visit review using our clinic review tool, if applicable. No additional management support is needed unless otherwise documented below in the visit note. 

## 2014-12-05 NOTE — Patient Instructions (Signed)

## 2014-12-06 NOTE — Progress Notes (Signed)
Subjective:  Patient ID: [name], female    DOB: 07/30/1975  Age: 39 y.o. MRN: [id number]  CC: Abdominal Pain   HPI [name] presents for for left lower quadrant abdominal pain for 3 weeks. She describes a stabbing sensation associated with exercise and movement. She had a previous repair of an umbilical hernia associated with 3 C-sections.  Outpatient Prescriptions Prior to Visit  Medication Sig Dispense Refill  . Calcium Carbonate-Vitamin D (CALCIUM + D) 600-200 MG-UNIT TABS Take 4 tablets by mouth daily.    Marland Kitchen eletriptan (RELPAX) 40 MG tablet Take 40 mg by mouth as needed for migraine. One tablet by mouth at onset of headache. May repeat in 2 hours if headache persists or recurs.    Marland Kitchen EPINEPHrine (EPI-PEN) 0.3 mg/0.3 mL DEVI Inject 0.3 mg into the muscle as needed.    . LO LOESTRIN FE 1 MG-10 MCG / 10 MCG tablet Take 1 tablet by mouth daily.     . Multiple Vitamin (MULTIVITAMIN) tablet Take 1 tablet by mouth daily.    Marland Kitchen GRAPE SEED EXTRACT PO Take by mouth.    . labetalol (NORMODYNE) 100 MG tablet Take 1 tablet (100 mg total) by mouth 3 (three) times daily as needed. 90 tablet 10  . ondansetron (ZOFRAN) 4 MG tablet Take 1 tablet (4 mg total) by mouth every 8 (eight) hours as needed for nausea or vomiting. 12 tablet 0   No facility-administered medications prior to visit.    ROS Review of Systems  Constitutional: Negative.  Negative for fever, chills, diaphoresis, appetite change and fatigue.  HENT: Negative.   Eyes: Negative.   Respiratory: Negative.  Negative for cough, choking, chest tightness, shortness of breath and stridor.   Cardiovascular: Negative.  Negative for chest pain, palpitations and leg swelling.  Gastrointestinal: Positive for abdominal pain. Negative for nausea, vomiting, diarrhea, constipation, blood in stool, abdominal distention, anal bleeding and rectal pain.  Endocrine: Negative.   Genitourinary: Negative.  Negative for dysuria, urgency, frequency,  hematuria, flank pain, decreased urine volume and difficulty urinating.  Musculoskeletal: Negative.  Negative for myalgias, back pain, joint swelling and arthralgias.  Skin: Negative.  Negative for rash.  Allergic/Immunologic: Negative.   Neurological: Negative.  Negative for dizziness, tremors, weakness, numbness and headaches.  Hematological: Negative.  Negative for adenopathy. Does not bruise/bleed easily.  Psychiatric/Behavioral: Negative.     Objective:  BP 108/68 mmHg  Pulse 90  Temp(Src) 98.7 F (37.1 C) (Oral)  Resp 16  Ht 5\' 1"  (1.549 m)  Wt 119 lb 4 oz (54.091 kg)  BMI 22.54 kg/m2  SpO2 97%  LMP 11/04/2014  BP Readings from Last 3 Encounters:  12/05/14 108/68  04/23/14 98/62  10/20/13 98/70    Wt Readings from Last 3 Encounters:  12/05/14 119 lb 4 oz (54.091 kg)  04/23/14 121 lb 1.9 oz (54.94 kg)  10/20/13 117 lb 9.6 oz (53.343 kg)    Physical Exam  Constitutional: She is oriented to person, place, and time.  Non-toxic appearance. She does not have a sickly appearance. She does not appear ill. No distress.  HENT:  Mouth/Throat: Oropharynx is clear and moist. No oropharyngeal exudate.  Eyes: Conjunctivae are normal. Right eye exhibits no discharge. Left eye exhibits no discharge. No scleral icterus.  Neck: Normal range of motion. Neck supple. No JVD present. No tracheal deviation present. No thyromegaly present.  Cardiovascular: Normal rate, regular rhythm, normal heart sounds and intact distal pulses.  Exam reveals no gallop and no  friction rub.   No murmur heard. Pulmonary/Chest: Effort normal and breath sounds normal. No stridor. No respiratory distress. She has no wheezes. She has no rales. She exhibits no tenderness.  Abdominal: Soft. Normal appearance and bowel sounds are normal. She exhibits no distension and no mass. There is no hepatosplenomegaly, splenomegaly or hepatomegaly. There is no tenderness. There is no rigidity, no rebound, no guarding, no CVA  tenderness, no tenderness at McBurney's point and negative Murphy's sign. No hernia. Hernia confirmed negative in the ventral area, confirmed negative in the right inguinal area and confirmed negative in the left inguinal area.  There is a laxity in the anterior lower abdominal wall muscles bilaterally but it does not feel like it's a hernia.  Musculoskeletal: Normal range of motion. She exhibits no edema or tenderness.  Lymphadenopathy:    She has no cervical adenopathy.  Neurological: She is oriented to person, place, and time.  Skin: Skin is warm and dry. No rash noted. She is not diaphoretic. No erythema. No pallor.  Psychiatric: She has a normal mood and affect. Her behavior is normal. Judgment and thought content normal.    Lab Results  Component Value Date   WBC 6.2 12/05/2014   HGB 15.1* 12/05/2014   HCT 43.5 12/05/2014   PLT 223.0 12/05/2014   GLUCOSE 85 12/05/2014   CHOL 116 01/27/2013   TRIG 38.0 01/27/2013   HDL 51.50 01/27/2013   LDLCALC 57 01/27/2013   ALT 23 12/05/2014   AST 18 12/05/2014   NA 138 12/05/2014   K 4.8 12/05/2014   CL 102 12/05/2014   CREATININE 0.84 12/05/2014   BUN 22 12/05/2014   CO2 25 12/05/2014   TSH 1.05 10/20/2013   INR 1.23 05/25/2009    Mr Brain W Wo Contrast  05/17/2014   CLINICAL DATA:  Recurrent episodes of dizziness yesterday lasting 2-5 min each.  EXAM: MRI HEAD WITHOUT AND WITH CONTRAST  TECHNIQUE: Multiplanar, multiecho pulse sequences of the brain and surrounding structures were obtained without and with intravenous contrast.  CONTRAST:  10mL MULTIHANCE GADOBENATE DIMEGLUMINE 529 MG/ML IV SOLN  COMPARISON:  None.  FINDINGS: There is no acute infarct. Ventricles and sulci are normal for age. There is no evidence of intracranial hemorrhage, mass, midline shift, or extra-axial fluid collection. No brain parenchymal signal abnormality or abnormal enhancement is identified.  Orbits are unremarkable. Paranasal sinuses and mastoid air cells  are clear. Major intracranial vascular flow voids are preserved. Calvarium and scalp soft tissues are unremarkable.  IMPRESSION: Unremarkable brain MRI.   Electronically Signed   By: Sebastian Ache   On: 05/17/2014 14:40    Assessment & Plan:   [name] was seen today for abdominal pain.  Diagnoses and all orders for this visit:  Abdominal tenderness, LLQ (left lower quadrant)- labs are normal, she is not pregnant. I will get a CT scan of the abdomen and pelvis done to see if there is a hernia and to check for a mass in the abdomen, fallopian tubes, pelvis and ovaries. Orders: -     CBC with Differential/Platelet; Future -     Comprehensive metabolic panel; Future -     Urinalysis, Routine w reflex microscopic (not at [facility]); Future -     hCG, quantitative, pregnancy; Future -     CT Abdomen Pelvis W Contrast; Future   I have discontinued [name]'s labetalol, GRAPE SEED EXTRACT PO, and ondansetron. I am also having her maintain her Calcium Carbonate-Vitamin D, multivitamin, EPINEPHrine, LO LOESTRIN  FE, eletriptan, imipramine, and CRYSELLE-28.  Meds ordered this encounter  Medications  . imipramine (TOFRANIL) 25 MG tablet    Sig: Take 25 mg by mouth daily.    Refill:  5  . CRYSELLE-28 0.3-30 MG-MCG tablet    Sig: Take 1 tablet by mouth daily.    Refill:  0     Follow-up: Return in about 3 weeks (around 12/26/2014).  Sanda Linger, MD

## 2014-12-12 ENCOUNTER — Telehealth: Payer: Self-pay | Admitting: Internal Medicine

## 2014-12-12 NOTE — Telephone Encounter (Signed)
Patient is scheduled for a CT scan on Wednesday for pain abdomen and the pain has gone away now for about 6 days.  She feels that the CT may not be necessary now and wants to cancel. Just wanted to be sure you know about it and that it would be ok to cancel. Please advise

## 2014-12-12 NOTE — Telephone Encounter (Signed)
She should let me know if the pain returns.

## 2014-12-13 NOTE — Telephone Encounter (Signed)
Pt aware and will be cancelling her scan

## 2014-12-14 ENCOUNTER — Inpatient Hospital Stay: Admission: RE | Admit: 2014-12-14 | Payer: BLUE CROSS/BLUE SHIELD | Source: Ambulatory Visit

## 2015-09-18 DIAGNOSIS — G43019 Migraine without aura, intractable, without status migrainosus: Secondary | ICD-10-CM | POA: Diagnosis not present

## 2015-09-18 DIAGNOSIS — G43719 Chronic migraine without aura, intractable, without status migrainosus: Secondary | ICD-10-CM | POA: Diagnosis not present

## 2015-11-16 DIAGNOSIS — Z6822 Body mass index (BMI) 22.0-22.9, adult: Secondary | ICD-10-CM | POA: Diagnosis not present

## 2015-11-16 DIAGNOSIS — Z01419 Encounter for gynecological examination (general) (routine) without abnormal findings: Secondary | ICD-10-CM | POA: Diagnosis not present

## 2016-02-19 DIAGNOSIS — M9905 Segmental and somatic dysfunction of pelvic region: Secondary | ICD-10-CM | POA: Diagnosis not present

## 2016-02-19 DIAGNOSIS — M9901 Segmental and somatic dysfunction of cervical region: Secondary | ICD-10-CM | POA: Diagnosis not present

## 2016-02-19 DIAGNOSIS — M9903 Segmental and somatic dysfunction of lumbar region: Secondary | ICD-10-CM | POA: Diagnosis not present

## 2016-02-19 DIAGNOSIS — Z23 Encounter for immunization: Secondary | ICD-10-CM | POA: Diagnosis not present

## 2016-02-19 DIAGNOSIS — M9902 Segmental and somatic dysfunction of thoracic region: Secondary | ICD-10-CM | POA: Diagnosis not present

## 2016-02-22 DIAGNOSIS — M9903 Segmental and somatic dysfunction of lumbar region: Secondary | ICD-10-CM | POA: Diagnosis not present

## 2016-02-22 DIAGNOSIS — M9905 Segmental and somatic dysfunction of pelvic region: Secondary | ICD-10-CM | POA: Diagnosis not present

## 2016-02-22 DIAGNOSIS — M9902 Segmental and somatic dysfunction of thoracic region: Secondary | ICD-10-CM | POA: Diagnosis not present

## 2016-02-22 DIAGNOSIS — M9901 Segmental and somatic dysfunction of cervical region: Secondary | ICD-10-CM | POA: Diagnosis not present

## 2016-02-27 DIAGNOSIS — M9901 Segmental and somatic dysfunction of cervical region: Secondary | ICD-10-CM | POA: Diagnosis not present

## 2016-02-27 DIAGNOSIS — M9902 Segmental and somatic dysfunction of thoracic region: Secondary | ICD-10-CM | POA: Diagnosis not present

## 2016-02-27 DIAGNOSIS — M9905 Segmental and somatic dysfunction of pelvic region: Secondary | ICD-10-CM | POA: Diagnosis not present

## 2016-02-27 DIAGNOSIS — M9903 Segmental and somatic dysfunction of lumbar region: Secondary | ICD-10-CM | POA: Diagnosis not present

## 2016-03-03 DIAGNOSIS — L03111 Cellulitis of right axilla: Secondary | ICD-10-CM | POA: Diagnosis not present

## 2016-03-03 DIAGNOSIS — L03112 Cellulitis of left axilla: Secondary | ICD-10-CM | POA: Diagnosis not present

## 2016-03-18 DIAGNOSIS — M9902 Segmental and somatic dysfunction of thoracic region: Secondary | ICD-10-CM | POA: Diagnosis not present

## 2016-03-18 DIAGNOSIS — M9905 Segmental and somatic dysfunction of pelvic region: Secondary | ICD-10-CM | POA: Diagnosis not present

## 2016-03-18 DIAGNOSIS — M9903 Segmental and somatic dysfunction of lumbar region: Secondary | ICD-10-CM | POA: Diagnosis not present

## 2016-03-18 DIAGNOSIS — M9901 Segmental and somatic dysfunction of cervical region: Secondary | ICD-10-CM | POA: Diagnosis not present

## 2016-04-01 DIAGNOSIS — G43019 Migraine without aura, intractable, without status migrainosus: Secondary | ICD-10-CM | POA: Diagnosis not present

## 2016-04-01 DIAGNOSIS — G43719 Chronic migraine without aura, intractable, without status migrainosus: Secondary | ICD-10-CM | POA: Diagnosis not present

## 2016-06-04 ENCOUNTER — Encounter: Payer: Self-pay | Admitting: Sports Medicine

## 2016-06-04 ENCOUNTER — Ambulatory Visit (INDEPENDENT_AMBULATORY_CARE_PROVIDER_SITE_OTHER): Payer: BLUE CROSS/BLUE SHIELD | Admitting: Sports Medicine

## 2016-06-04 ENCOUNTER — Ambulatory Visit (INDEPENDENT_AMBULATORY_CARE_PROVIDER_SITE_OTHER): Payer: BLUE CROSS/BLUE SHIELD

## 2016-06-04 VITALS — BP 104/78 | HR 101 | Ht 61.0 in | Wt 121.3 lb

## 2016-06-04 DIAGNOSIS — S93525A Sprain of metatarsophalangeal joint of left lesser toe(s), initial encounter: Secondary | ICD-10-CM | POA: Diagnosis not present

## 2016-06-04 DIAGNOSIS — M79672 Pain in left foot: Secondary | ICD-10-CM

## 2016-06-04 MED ORDER — TRAMADOL HCL 50 MG PO TABS: 50.0000 mg | ORAL_TABLET | Freq: Four times a day (QID) | ORAL | 0 refills | Status: DC | PRN

## 2016-06-04 NOTE — Assessment & Plan Note (Signed)
X-ray consistent with small medial sided capsular avulsion.  Buddy taping and postop shoe provided today.  Patient is not a candidate for NSAIDs but tramadol will be provided.  TCA worsening due to imipramine needs to be monitored.  Anticipate fairly rapid improvement.

## 2016-06-04 NOTE — Progress Notes (Signed)
[name] - 41 y.o. female MRN [id number]  Date of birth: 1976-03-06  Office Visit Note: Visit Date: 06/04/2016 PCP: Sanda Linger, MD Referred by: Etta Grandchild, MD  Subjective: Chief Complaint  Patient presents with  . left foot pain    lateral left foot pain after fall on 06/03/16. No swelling in the foot or ankle.    HPI: Inversion injury yesterday while at home.  Marked pain along the lateral column longest metatarsal and proximal phalanx of the third fourth and fifth ray.  She has been taking Tylenol only.  She is unable to take anti-inflammatories due to von Willebrand's disease..  The pain kept her up all night with throbbing.  She is having difficulty with weightbearing. ROS:  Otherwise per HPI.  Objective:  VS:  HT:5\' 1"  (154.9 cm)   WT:121 lb 4.8 oz (55 kg)  BMI:23    BP:104/78  HR:(!) 101bpm  TEMP: ( )  RESP:98 % Physical Exam: Adult female. Alert and appropriate.  In no acute distress.  Lower extremities are overall well aligned with no significant deformity. No significant swelling.   Distal pulses 2+/4. No significant bruising/ecchymosis or erythema the skin Left foot: Overall well aligned.  Only a small amount of swelling along the lateral ray at the MTPs.  Marked tenderness to palpation along the distal metatarsals and third and fourth phalanges.  DP and PT pulses 2+/4.  Sensation intact to light touch.  Ankle exam is stable.  Full ankle range of motion but pain with resisted plantar flexion due to pain in the foot.  No pain across the dorsum of the foot or across the Lisfranc joint. Imaging: No results found.  - primary read negative for acute fracture. Possible small capsular avulsion vs sesmoid along medial aspect but favor acute process given that is the area of most focal pain.    Assessment & Plan: Visit Diagnoses:    ICD-9-CM ICD-10-CM   1. Left foot pain 729.5 M79.672 DG Foot Complete Left  2. Sprain of MTP joint of left lesser toe(s), init 845.12  S93.525A     Sprain of MTP joint of left lesser toe(s), init X-ray consistent with small medial sided capsular avulsion.  Buddy taping and postop shoe provided today.  Patient is not a candidate for NSAIDs but tramadol will be provided.  TCA worsening due to imipramine needs to be monitored.  Anticipate fairly rapid improvement.    Procedures:   Follow-up: Return if symptoms worsen or fail to improve.    Past Medical/Family/Surgical/Social History: Medications & Allergies reviewed per EMR Patient Active Problem List   Diagnosis Date Noted  . Sprain of MTP joint of left lesser toe(s), init 06/04/2016  . Abdominal tenderness, LLQ (left lower quadrant) 12/05/2014  . Von Willebrand disease (HCC) 11/03/2010  . Atrial fibrillation (HCC) 06/15/2008   Past Medical History:  Diagnosis Date  . Broken leg    BROKEN LEG AND HAND AFTER ACCIDENT  . GERD (gastroesophageal reflux disease)   . Hx of atrial fibrillation, no current medication    PAT with pregnancy only-with last 2 pregnancy and currrent-using labetalol prn  . MVP (mitral valve prolapse)    MILD WITH NORMAL LV FX ON ECHO 4/08  . Upper respiratory infection current   finished Z Pak, new prescription for Augmentum to start today 03/04/11  . Von Willebrand disease (HCC)    Hematologist- [facility]-pt states mild and gone during pregnancy   Family History  Problem Relation Age  of Onset  . Hypertension Father     HAD CATH THAT WAS NORMAL  . Other Mother     PALPITATION  . Cancer Paternal Grandmother     thryoid   Past Surgical History:  Procedure Laterality Date  . CESAREAN SECTION     3  . CESAREAN SECTION  03/08/2011  . HAND SURGERY  2003  . HERNIA REPAIR    . repair fracture leg  2003  . TONSILLECTOMY     teenager   Social History   Occupational History  . Not on file.   Social History Main Topics  . Smoking status: Never Smoker  . Smokeless tobacco: Never Used  . Alcohol use 0.0 oz/week     1 - 2 Cans of beer per week     Comment: week  . Drug use: No  . Sexual activity: Yes

## 2016-06-05 DIAGNOSIS — J101 Influenza due to other identified influenza virus with other respiratory manifestations: Secondary | ICD-10-CM | POA: Diagnosis not present

## 2016-06-06 ENCOUNTER — Encounter (HOSPITAL_COMMUNITY): Payer: Self-pay

## 2016-06-06 ENCOUNTER — Emergency Department (HOSPITAL_COMMUNITY)
Admission: EM | Admit: 2016-06-06 | Discharge: 2016-06-06 | Disposition: A | Discharge status: Home / Self Care | Payer: BLUE CROSS/BLUE SHIELD | Attending: Emergency Medicine | Admitting: Emergency Medicine

## 2016-06-06 ENCOUNTER — Emergency Department (HOSPITAL_COMMUNITY): Payer: BLUE CROSS/BLUE SHIELD

## 2016-06-06 DIAGNOSIS — R079 Chest pain, unspecified: Secondary | ICD-10-CM | POA: Diagnosis not present

## 2016-06-06 DIAGNOSIS — J111 Influenza due to unidentified influenza virus with other respiratory manifestations: Secondary | ICD-10-CM | POA: Insufficient documentation

## 2016-06-06 DIAGNOSIS — R112 Nausea with vomiting, unspecified: Secondary | ICD-10-CM | POA: Diagnosis present

## 2016-06-06 DIAGNOSIS — E860 Dehydration: Secondary | ICD-10-CM | POA: Insufficient documentation

## 2016-06-06 DIAGNOSIS — R05 Cough: Secondary | ICD-10-CM | POA: Diagnosis not present

## 2016-06-06 LAB — COMPREHENSIVE METABOLIC PANEL
ALK PHOS: 40 U/L (ref 38–126)
ALT: 12 U/L — AB (ref 14–54)
ANION GAP: 12 (ref 5–15)
AST: 21 U/L (ref 15–41)
Albumin: 3.8 g/dL (ref 3.5–5.0)
BUN: 11 mg/dL (ref 6–20)
CALCIUM: 9.2 mg/dL (ref 8.9–10.3)
CHLORIDE: 101 mmol/L (ref 101–111)
CO2: 24 mmol/L (ref 22–32)
Creatinine, Ser: 1.01 mg/dL — ABNORMAL HIGH (ref 0.44–1.00)
GFR calc non Af Amer: >60 mL/min (ref >60)
GLUCOSE: 90 mg/dL (ref 65–99)
Potassium: 4.2 mmol/L (ref 3.5–5.1)
SODIUM: 137 mmol/L (ref 135–145)
Total Bilirubin: 1.2 mg/dL (ref 0.3–1.2)
Total Protein: 7.2 g/dL (ref 6.5–8.1)

## 2016-06-06 LAB — CBC
HCT: 45.2 % (ref 36.0–46.0)
Hemoglobin: 15.1 g/dL — ABNORMAL HIGH (ref 12.0–15.0)
MCH: 30.4 pg (ref 26.0–34.0)
MCHC: 33.4 g/dL (ref 30.0–36.0)
MCV: 90.9 fL (ref 78.0–100.0)
Platelets: 148 10*3/uL — ABNORMAL LOW (ref 150–400)
RBC: 4.97 MIL/uL (ref 3.87–5.11)
RDW: 12.6 % (ref 11.5–15.5)
WBC: 2.4 10*3/uL — ABNORMAL LOW (ref 4.0–10.5)

## 2016-06-06 LAB — I-STAT BETA HCG BLOOD, ED (MC, WL, AP ONLY): I-stat hCG, quantitative: <5 m[IU]/mL (ref <5)

## 2016-06-06 MED ORDER — PROMETHAZINE HCL 25 MG PO TABS: 25.0000 mg | ORAL_TABLET | Freq: Four times a day (QID) | ORAL | 0 refills | Status: DC | PRN

## 2016-06-06 MED ORDER — ONDANSETRON HCL 4 MG/2ML IJ SOLN
4.0000 mg | Freq: Once | INTRAMUSCULAR | Status: AC
  Administered 2016-06-06: 4 mg via INTRAVENOUS
  Filled 2016-06-06: qty 2

## 2016-06-06 MED ORDER — SODIUM CHLORIDE 0.9 % IV BOLUS (SEPSIS)
2000.0000 mL | Freq: Once | INTRAVENOUS | Status: AC
  Administered 2016-06-06: 2000 mL via INTRAVENOUS

## 2016-06-06 MED ORDER — KETOROLAC TROMETHAMINE 30 MG/ML IJ SOLN: 30.0000 mg | Freq: Once | INTRAMUSCULAR | Status: DC

## 2016-06-06 MED ORDER — MORPHINE SULFATE (PF) 4 MG/ML IV SOLN
4.0000 mg | Freq: Once | INTRAVENOUS | Status: AC
  Administered 2016-06-06: 4 mg via INTRAVENOUS
  Filled 2016-06-06: qty 1

## 2016-06-06 NOTE — ED Triage Notes (Signed)
Patient here with emesis that started last night after being diagnosed with flu, complains of generalized aching and was prescribed tamiflu. Alert and oriented, no vomiting on arrival

## 2016-06-06 NOTE — ED Notes (Signed)
Patient transported to X-ray 

## 2016-06-11 NOTE — ED Provider Notes (Signed)
[facility]-EMERGENCY DEPT Provider Note   CSN: [id number] Arrival date & time: 06/06/16  3086     History   Chief Complaint Chief Complaint  Patient presents with  . Emesis    HPI [name] is a 41 y.o. female.  HPI Patient presents to the emergency department with complaints of nausea and vomiting that began last night.  She was recently diagnosed with influenza and started on Tamiflu.  She reports nausea this time without vomiting.  Denies abdominal pain.  Reports no diarrhea.  Denies rash.  No productive cough.  States she overall feels poorly and feels weak and dehydrated.   Past Medical History:  Diagnosis Date  . Broken leg    BROKEN LEG AND HAND AFTER ACCIDENT  . GERD (gastroesophageal reflux disease)   . Hx of atrial fibrillation, no current medication    PAT with pregnancy only-with last 2 pregnancy and currrent-using labetalol prn  . MVP (mitral valve prolapse)    MILD WITH NORMAL LV FX ON ECHO 4/08  . Upper respiratory infection current   finished Z Pak, new prescription for Augmentum to start today 03/04/11  . Von Willebrand disease (HCC)    Hematologist- [facility]-pt states mild and gone during pregnancy    Patient Active Problem List   Diagnosis Date Noted  . Sprain of MTP joint of left lesser toe(s), init 06/04/2016  . Abdominal tenderness, LLQ (left lower quadrant) 12/05/2014  . Von Willebrand disease (HCC) 11/03/2010  . Atrial fibrillation (HCC) 06/15/2008    Past Surgical History:  Procedure Laterality Date  . CESAREAN SECTION     3  . CESAREAN SECTION  03/08/2011  . HAND SURGERY  2003  . HERNIA REPAIR    . repair fracture leg  2003  . TONSILLECTOMY     teenager    OB History    Gravida Para Term Preterm AB Living   3 3 3  0 0 3   SAB TAB Ectopic Multiple Live Births   0 0 0 0 3       Home Medications    Prior to Admission medications   Medication Sig Start Date End Date Taking? Authorizing Provider  cholecalciferol  (D-VI-SOL) 400 UNIT/ML LIQD Take 400 Units by mouth daily.   Yes Historical Provider, MD  CRYSELLE-28 0.3-30 MG-MCG tablet Take 1 tablet by mouth daily. 10/11/14  Yes Historical Provider, MD  eletriptan (RELPAX) 40 MG tablet Take 40 mg by mouth as needed for migraine. One tablet by mouth at onset of headache. May repeat in 2 hours if headache persists or recurs.   Yes Historical Provider, MD  EPINEPHrine (EPI-PEN) 0.3 mg/0.3 mL DEVI Inject 0.3 mg into the muscle as needed. 07/23/12  Yes Lorre Munroe, NP  ondansetron (ZOFRAN) 4 MG tablet Take 4 mg by mouth every 8 (eight) hours as needed for nausea or vomiting.   Yes Historical Provider, MD  oseltamivir (TAMIFLU) 75 MG capsule Take 75 mg by mouth 2 (two) times daily. 06/05/16  Yes Historical Provider, MD  Probiotic Product (PROBIOTIC PO) Take 1 tablet by mouth daily.   Yes Historical Provider, MD  promethazine (PHENERGAN) 25 MG tablet Take 1 tablet (25 mg total) by mouth every 6 (six) hours as needed for nausea or vomiting. 06/06/16   Azalia Bilis, MD  traMADol (ULTRAM) 50 MG tablet Take 1 tablet (50 mg total) by mouth every 6 (six) hours as needed for moderate pain. Patient not taking: Reported on 06/06/2016 06/04/16   Casimiro Needle  Mollie Germany Rigby, DO    Family History Family History  Problem Relation Age of Onset  . Hypertension Father     HAD CATH THAT WAS NORMAL  . Other Mother     PALPITATION  . Cancer Paternal Grandmother     thryoid    Social History Social History  Substance Use Topics  . Smoking status: Never Smoker  . Smokeless tobacco: Never Used  . Alcohol use 0.0 oz/week    1 - 2 Cans of beer per week     Comment: week     Allergies   Avocado; Pineapple; Sesame oil; Almond oil; Cardamom [elettaria cardamomum minuscula]; Imitrex [sumatriptan]; Prochlorperazine edisylate; and Sulfonamide derivatives   Review of Systems Review of Systems  All other systems reviewed and are negative.    Physical Exam Updated Vital Signs BP 103/77  (BP Location: Left Arm)   Pulse 88   Temp 98.2 F (36.8 C) (Oral)   Resp 15   Ht 5\' 1"  (1.549 m)   Wt 120 lb (54.4 kg)   LMP 05/13/2016 (Approximate)   SpO2 100%   BMI 22.67 kg/m   Physical Exam  Constitutional: She is oriented to person, place, and time. She appears well-developed and well-nourished. No distress.  HENT:  Head: Normocephalic and atraumatic.  Eyes: EOM are normal.  Neck: Normal range of motion.  Cardiovascular: Normal rate, regular rhythm and normal heart sounds.   Pulmonary/Chest: Effort normal and breath sounds normal.  Abdominal: Soft. She exhibits no distension. There is no tenderness.  Musculoskeletal: Normal range of motion.  Neurological: She is alert and oriented to person, place, and time.  Skin: Skin is warm and dry.  Psychiatric: She has a normal mood and affect. Judgment normal.  Nursing note and vitals reviewed.    ED Treatments / Results  Labs (all labs ordered are listed, but only abnormal results are displayed) Labs Reviewed  CBC - Abnormal; Notable for the following:       Result Value   WBC 2.4 (*)    Hemoglobin 15.1 (*)    Platelets 148 (*)    All other components within normal limits  COMPREHENSIVE METABOLIC PANEL - Abnormal; Notable for the following:    Creatinine, Ser 1.01 (*)    ALT 12 (*)    All other components within normal limits  I-STAT BETA HCG BLOOD, ED ([facility], [facility], [facility] ONLY)    EKG  EKG Interpretation None       Radiology No results found.  Procedures Procedures (including critical care time)  Medications Ordered in ED Medications  sodium chloride 0.9 % bolus 2,000 mL (0 mLs Intravenous Stopped 06/06/16 1308)  morphine 4 MG/ML injection 4 mg (4 mg Intravenous Given 06/06/16 1125)  ondansetron (ZOFRAN) injection 4 mg (4 mg Intravenous Given 06/06/16 1124)     Initial Impression / Assessment and Plan / ED Course  I have reviewed the triage vital signs and the nursing notes.  Pertinent labs & imaging results that  were available during my care of the patient were reviewed by me and considered in my medical decision making (see chart for details).     Overall well-appearing.  Feels much better after IV fluids.  Suspect influenza with associated dehydration.  Primary care follow-up.  She understands to return to the ER for new or worsening symptoms.  Home with Phenergan.  Final Clinical Impressions(s) / ED Diagnoses   Final diagnoses:  Influenza  Dehydration    New Prescriptions Discharge Medication List as  of 06/06/2016 12:43 PM    START taking these medications   Details  promethazine (PHENERGAN) 25 MG tablet Take 1 tablet (25 mg total) by mouth every 6 (six) hours as needed for nausea or vomiting., Starting Thu 06/06/2016, Print         Azalia Bilis, MD 06/11/16 [phone]

## 2016-09-25 ENCOUNTER — Ambulatory Visit (INDEPENDENT_AMBULATORY_CARE_PROVIDER_SITE_OTHER): Payer: BLUE CROSS/BLUE SHIELD | Admitting: Sports Medicine

## 2016-09-25 ENCOUNTER — Ambulatory Visit: Payer: Self-pay

## 2016-09-25 ENCOUNTER — Encounter: Payer: Self-pay | Admitting: Sports Medicine

## 2016-09-25 VITALS — BP 102/62 | HR 95 | Ht 61.0 in | Wt 126.2 lb

## 2016-09-25 DIAGNOSIS — G43909 Migraine, unspecified, not intractable, without status migrainosus: Secondary | ICD-10-CM | POA: Insufficient documentation

## 2016-09-25 DIAGNOSIS — G43809 Other migraine, not intractable, without status migrainosus: Secondary | ICD-10-CM

## 2016-09-25 DIAGNOSIS — M25521 Pain in right elbow: Secondary | ICD-10-CM

## 2016-09-25 DIAGNOSIS — M7710 Lateral epicondylitis, unspecified elbow: Secondary | ICD-10-CM | POA: Insufficient documentation
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
## 2016-09-25 NOTE — Assessment & Plan Note (Signed)
She is not a candidate for nitroglycerin therapy.

## 2016-09-25 NOTE — Assessment & Plan Note (Signed)
Symptoms consistent with tennis elbow. Patient will try topical Pennsaid and let us know she is interested in obtaining a prescription for this. Therapeutic exercises reviewed in detail focusing on eccentric phase of the common flexor tendon. Compression with Body Helix full elbow sleeve discussed the measurements were obtained patient was counseled on where to obtain this.  +++++++++++++++++++++++++++++++++++++++++++++++++++++++++++++++ PROCEDURE NOTE: THERAPEUTIC EXERCISES (97110) 15 minutes spent for Therapeutic exercises as stated in above notes.  This included exercises focusing on stretching, strengthening, with significant focus on eccentric aspects.   Proper technique shown and discussed handout in great detail with ATC.  All questions were discussed and answered.

## 2016-09-25 NOTE — Patient Instructions (Addendum)
Please perform the exercise program that Fayrene FearingJames has prepared for you and gone over in detail on a daily basis.  In addition to the handout you were provided you can access your program through: [email or web address]   Your unique program code is: [email or web address]  I recommend you obtained a compression sleeve to help with your joint problems. There are many options on the market however I recommend obtaining a FULL ELBOW SIZE: SMALL  Body Helix compression sleeve.  You can find information (including how to appropriate measure yourself for sizing) can be found at [email or web address].  Many of these products are health savings account (HSA) eligible.   You can use the compression sleeve at any time throughout the day but is most important to use while being active as well as for 2 hours post-activity.   It is appropriate to ice following activity with the compression sleeve in place.

## 2016-10-30 ENCOUNTER — Encounter: Payer: Self-pay | Admitting: Sports Medicine

## 2016-10-30 ENCOUNTER — Ambulatory Visit (INDEPENDENT_AMBULATORY_CARE_PROVIDER_SITE_OTHER): Payer: BLUE CROSS/BLUE SHIELD | Admitting: Sports Medicine

## 2016-10-30 ENCOUNTER — Ambulatory Visit: Payer: Self-pay

## 2016-10-30 VITALS — BP 102/72 | HR 74 | Ht 61.0 in | Wt 129.4 lb

## 2016-10-30 DIAGNOSIS — G5621 Lesion of ulnar nerve, right upper limb: Secondary | ICD-10-CM | POA: Diagnosis not present

## 2016-10-30 DIAGNOSIS — M7711 Lateral epicondylitis, right elbow: Secondary | ICD-10-CM
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
## 2016-11-23 DIAGNOSIS — G5621 Lesion of ulnar nerve, right upper limb: Secondary | ICD-10-CM | POA: Insufficient documentation

## 2016-11-23 NOTE — Procedures (Signed)
LIMITED MSK ULTRASOUND OF Right Elbow Images were obtained and interpreted by myself, Gaspar BiddingMichael Halana Deisher, DO  Images have been saved and stored to PACS system. Images obtained on: GE S7 Ultrasound machine  FINDINGS:   Persistent hypoechoic change within the common extensor tendon pain with sono palpation  Ulnar nerve subluxation with flexion and extension.  IMPRESSION:  1. 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 Lateral epicondylosis

## 2016-11-23 NOTE — Assessment & Plan Note (Signed)
Evidence of ulnar nerve subluxation over the medial epicondyles at the level of the cubital tunnel. Avoidance of exacerbating activities and continued compression discussed.

## 2016-11-23 NOTE — Assessment & Plan Note (Signed)
Symptoms lateral epicondylosis remaining.  Continue with topical Pennsaid which she already has at home as well as eccentric exercises for the common extensor tendons.  Body Helix compression sleeve discussed

## 2016-12-18 LAB — HM PAP SMEAR

## 2016-12-30 DIAGNOSIS — Z6824 Body mass index (BMI) 24.0-24.9, adult: Secondary | ICD-10-CM | POA: Diagnosis not present

## 2016-12-30 DIAGNOSIS — N958 Other specified menopausal and perimenopausal disorders: Secondary | ICD-10-CM | POA: Diagnosis not present

## 2016-12-30 DIAGNOSIS — Z01419 Encounter for gynecological examination (general) (routine) without abnormal findings: Secondary | ICD-10-CM | POA: Diagnosis not present

## 2016-12-30 DIAGNOSIS — M8588 Other specified disorders of bone density and structure, other site: Secondary | ICD-10-CM | POA: Diagnosis not present

## 2017-01-01 DIAGNOSIS — Z131 Encounter for screening for diabetes mellitus: Secondary | ICD-10-CM | POA: Diagnosis not present

## 2017-01-01 DIAGNOSIS — Z1329 Encounter for screening for other suspected endocrine disorder: Secondary | ICD-10-CM | POA: Diagnosis not present

## 2017-01-01 DIAGNOSIS — Z1321 Encounter for screening for nutritional disorder: Secondary | ICD-10-CM | POA: Diagnosis not present

## 2017-01-01 DIAGNOSIS — Z13220 Encounter for screening for lipoid disorders: Secondary | ICD-10-CM | POA: Diagnosis not present

## 2017-01-02 DIAGNOSIS — G43019 Migraine without aura, intractable, without status migrainosus: Secondary | ICD-10-CM | POA: Diagnosis not present

## 2017-01-02 DIAGNOSIS — G43719 Chronic migraine without aura, intractable, without status migrainosus: Secondary | ICD-10-CM | POA: Diagnosis not present

## 2017-01-03 ENCOUNTER — Encounter: Payer: Self-pay | Admitting: Sports Medicine

## 2017-01-03 ENCOUNTER — Ambulatory Visit (INDEPENDENT_AMBULATORY_CARE_PROVIDER_SITE_OTHER): Payer: BLUE CROSS/BLUE SHIELD | Admitting: Sports Medicine

## 2017-01-03 ENCOUNTER — Ambulatory Visit: Payer: Self-pay

## 2017-01-03 VITALS — BP 104/74 | HR 94 | Ht 61.0 in | Wt 129.0 lb

## 2017-01-03 DIAGNOSIS — M67432 Ganglion, left wrist: Secondary | ICD-10-CM | POA: Diagnosis not present

## 2017-01-03 DIAGNOSIS — M7711 Lateral epicondylitis, right elbow: Secondary | ICD-10-CM | POA: Diagnosis not present

## 2017-01-03 DIAGNOSIS — G5621 Lesion of ulnar nerve, right upper limb: Secondary | ICD-10-CM

## 2017-01-03 NOTE — Procedures (Signed)
PROCEDURE NOTE -  ULTRASOUND GUIDEDINJECTION: Right Lateral Epicondyle Images were obtained and interpreted by myself, Gaspar BiddingMichael Gina Costilla, DO  Images have been saved and stored to PACS system. Images obtained on: GE S7 Ultrasound machine  ULTRASOUND FINDINGS:  Marked hypoechoic change at the lateral epicondyle with no appreciable interstitial splitting but lack of neovascularity.  Left wrist has a small cystic structure that is coming off of the proximal carpal row.  Normal cyst appearance.  DESCRIPTION OF PROCEDURE:  The patient's clinical condition is marked by substantial pain and/or significant functional disability. Other conservative therapy has not provided relief, is contraindicated, or not appropriate. There is a reasonable likelihood that injection will significantly improve the patient's pain and/or functional impairment. After discussing the risks, benefits and expected outcomes of the injection and all questions were reviewed and answered, the patient wished to undergo the above named procedure. Verbal consent was obtained. The ultrasound was used to identify the target structure and adjacent neurovascular structures. The skin was then prepped in sterile fashion and the target structure was injected under direct visualization using sterile technique as below: PREP: Alcohol, Ethel Chloride  APPROACH: direct inplane, with barbatoge, single injection, 25g 1.5" needle INJECTATE: 2cc 1% lidocaine, 2cc 0.5% marcaine, 0.5cc 40mg  DepoMedrol ASPIRATE: N/A DRESSING: Band-Aid  Post procedural instructions including recommending icing and warning signs for infection were reviewed. This procedure was well tolerated and there were no complications.   IMPRESSION: Succesful US Guided Injection
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
## 2017-01-03 NOTE — Assessment & Plan Note (Signed)
Small ganglion cyst on the left wrist, likely exacerbated with planking inking and hyperextension activities.  We will have her continue with the wrist brace as well as begin using Pennsaid twice per day that has been previously prescribed.  If any lack of improvement can consider injection but she would like to defer this at this time.

## 2017-01-03 NOTE — Assessment & Plan Note (Signed)
Approaching 6 months of symptoms with lack of improvement in spite of conservative measures.  She is not a candidate for nitroglycerin therapy.  We discussed the options including PRP and corticosteroid injections and will use a low-dose corticosteroid injection to see if we can stimulate healing as well as decrease the pain response.  Continue with compression and therapeutic exercises.  Slowly progress activities as tolerated.

## 2017-01-03 NOTE — Assessment & Plan Note (Signed)
This seems to be only minimally systematic for at this time.  We will continue to monitor closely and if any worsening ulnar type pain we will plan to refer for potential surgical consultation for nerve transposition surgery.

## 2017-01-03 NOTE — Patient Instructions (Signed)

## 2017-01-10 DIAGNOSIS — J101 Influenza due to other identified influenza virus with other respiratory manifestations: Secondary | ICD-10-CM | POA: Diagnosis not present

## 2017-01-16 DIAGNOSIS — J019 Acute sinusitis, unspecified: Secondary | ICD-10-CM | POA: Diagnosis not present

## 2017-01-16 DIAGNOSIS — J020 Streptococcal pharyngitis: Secondary | ICD-10-CM | POA: Diagnosis not present

## 2017-02-10 DIAGNOSIS — Z23 Encounter for immunization: Secondary | ICD-10-CM | POA: Diagnosis not present

## 2017-02-11 DIAGNOSIS — R799 Abnormal finding of blood chemistry, unspecified: Secondary | ICD-10-CM | POA: Diagnosis not present

## 2017-03-18 ENCOUNTER — Ambulatory Visit: Payer: BLUE CROSS/BLUE SHIELD | Admitting: Internal Medicine

## 2017-03-18 ENCOUNTER — Encounter: Payer: Self-pay | Admitting: Internal Medicine

## 2017-03-18 VITALS — BP 128/84 | HR 81 | Temp 98.5°F | Resp 16 | Wt 130.0 lb

## 2017-03-18 DIAGNOSIS — R590 Localized enlarged lymph nodes: Secondary | ICD-10-CM
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
## 2017-03-18 NOTE — Patient Instructions (Signed)
Lymphadenopathy Lymphadenopathy refers to swollen or enlarged lymph glands, also called lymph nodes. Lymph glands are part of your body's defense (immune) system, which protects the body from infections, germs, and diseases. Lymph glands are found in many locations in your body, including the neck, underarm, and groin. Many things can cause lymph glands to become enlarged. When your immune system responds to germs, such as viruses or bacteria, infection-fighting cells and fluid build up. This causes the glands to grow in size. Usually, this is not something to worry about. The swelling and any soreness often go away without treatment. However, swollen lymph glands can also be caused by a number of diseases. Your health care provider may do various tests to help determine the cause. If the cause of your swollen lymph glands cannot be found, it is important to monitor your condition to make sure the swelling goes away. Follow these instructions at home: Watch your condition for any changes. The following actions may help to lessen any discomfort you are feeling:  Get plenty of rest.  Take medicines only as directed by your health care provider. Your health care provider may recommend over-the-counter medicines for pain.  Apply moist heat compresses to the site of swollen lymph nodes as directed by your health care provider. This can help reduce any pain.  Check your lymph nodes daily for any changes.  Keep all follow-up visits as directed by your health care provider. This is important.  Contact a health care provider if:  Your lymph nodes are still swollen after 2 weeks.  Your swelling increases or spreads to other areas.  Your lymph nodes are hard, seem fixed to the skin, or are growing rapidly.  Your skin over the lymph nodes is red and inflamed.  You have a fever.  You have chills.  You have fatigue.  You develop a sore throat.  You have abdominal pain.  You have weight  loss.  You have night sweats. Get help right away if:  You notice fluid leaking from the area of the enlarged lymph node.  You have severe pain in any area of your body.  You have chest pain.  You have shortness of breath. This information is not intended to replace advice given to you by your health care provider. Make sure you discuss any questions you have with your health care provider. Document Released: 01/30/2008 Document Revised: 09/28/2015 Document Reviewed: 11/25/2013 Elsevier Interactive Patient Education  2018 [employer]  

## 2017-03-18 NOTE — Assessment & Plan Note (Signed)
Four lymph nodes on right side-2 palpable posterior auricular and 2 posterior cervical. No other concerning lymph nodes palpable No concerning symptoms of infection/cancer Likely reactive in nature Reassured her Advised symptomatic treatment If they do not resolve over the next couple of weeks will pursue imaging/further evaluation-she does have a physical scheduled for 1 month from now

## 2017-04-01 DIAGNOSIS — D225 Melanocytic nevi of trunk: Secondary | ICD-10-CM | POA: Diagnosis not present

## 2017-04-01 DIAGNOSIS — L0100 Impetigo, unspecified: Secondary | ICD-10-CM | POA: Diagnosis not present

## 2017-04-07 ENCOUNTER — Ambulatory Visit (INDEPENDENT_AMBULATORY_CARE_PROVIDER_SITE_OTHER): Payer: BLUE CROSS/BLUE SHIELD | Admitting: Internal Medicine

## 2017-04-07 ENCOUNTER — Encounter: Payer: Self-pay | Admitting: Internal Medicine

## 2017-04-07 ENCOUNTER — Other Ambulatory Visit (INDEPENDENT_AMBULATORY_CARE_PROVIDER_SITE_OTHER): Payer: BLUE CROSS/BLUE SHIELD

## 2017-04-07 VITALS — BP 120/78 | HR 78 | Temp 98.0°F | Resp 16 | Ht 61.0 in | Wt 130.5 lb

## 2017-04-07 DIAGNOSIS — Z0000 Encounter for general adult medical examination without abnormal findings: Secondary | ICD-10-CM

## 2017-04-07 DIAGNOSIS — R5383 Other fatigue: Secondary | ICD-10-CM | POA: Diagnosis not present

## 2017-04-07 LAB — CBC WITH DIFFERENTIAL/PLATELET
BASOS PCT: 0.6 % (ref 0.0–3.0)
Basophils Absolute: 0 10*3/uL (ref 0.0–0.1)
Eosinophils Absolute: 0.1 10*3/uL (ref 0.0–0.7)
Eosinophils Relative: 1 % (ref 0.0–5.0)
HCT: 41.3 % (ref 36.0–46.0)
HEMOGLOBIN: 13.9 g/dL (ref 12.0–15.0)
Lymphocytes Relative: 44.9 % (ref 12.0–46.0)
Lymphs Abs: 2.5 10*3/uL (ref 0.7–4.0)
MCHC: 33.5 g/dL (ref 30.0–36.0)
MCV: 90.7 fl (ref 78.0–100.0)
MONO ABS: 0.4 10*3/uL (ref 0.1–1.0)
Monocytes Relative: 6.6 % (ref 3.0–12.0)
Neutro Abs: 2.6 10*3/uL (ref 1.4–7.7)
Neutrophils Relative %: 46.9 % (ref 43.0–77.0)
Platelets: 248 10*3/uL (ref 150.0–400.0)
RBC: 4.56 Mil/uL (ref 3.87–5.11)
RDW: 12.4 % (ref 11.5–15.5)
WBC: 5.6 10*3/uL (ref 4.0–10.5)

## 2017-04-07 LAB — COMPREHENSIVE METABOLIC PANEL
ALT: 13 U/L (ref 0–35)
AST: 15 U/L (ref 0–37)
Albumin: 4.4 g/dL (ref 3.5–5.2)
Alkaline Phosphatase: 39 U/L (ref 39–117)
BUN: 15 mg/dL (ref 6–23)
CHLORIDE: 103 meq/L (ref 96–112)
CO2: 29 mEq/L (ref 19–32)
Calcium: 9.8 mg/dL (ref 8.4–10.5)
Creatinine, Ser: 0.85 mg/dL (ref 0.40–1.20)
GFR: 79.1 mL/min (ref >60.00)
Glucose, Bld: 88 mg/dL (ref 70–99)
POTASSIUM: 4.3 meq/L (ref 3.5–5.1)
SODIUM: 140 meq/L (ref 135–145)
Total Bilirubin: 0.8 mg/dL (ref 0.2–1.2)
Total Protein: 7.7 g/dL (ref 6.0–8.3)

## 2017-04-07 LAB — LIPID PANEL
CHOL/HDL RATIO: 3
Cholesterol: 144 mg/dL (ref 0–200)
HDL: 45.4 mg/dL (ref >39.00)
LDL CALC: 79 mg/dL (ref 0–99)
NONHDL: 98.36
Triglycerides: 97 mg/dL (ref 0.0–149.0)
VLDL: 19.4 mg/dL (ref 0.0–40.0)

## 2017-04-07 LAB — SEDIMENTATION RATE: Sed Rate: 7 mm/hr (ref 0–20)

## 2017-04-07 NOTE — Patient Instructions (Signed)

## 2017-04-07 NOTE — Progress Notes (Signed)
Subjective:  Patient ID: [name], female    DOB: 1975-11-28  Age: 41 y.o. MRN: [id number]  CC: Annual Exam   HPI Angelita Rondel BatonS Schimpf presents for a CPX.  Over the last week or so she had noted some lymphadenopathy on the right side of her neck.  At the onset she had fever and chills but she tells me those symptoms have resolved.  She complains of fatigue.  She tells me that 5 days prior to this appointment she saw her dermatologist for a lesion on her right ear.  She tells me it was cultured and was positive for staph.  She has been taking Keflex but has heard from the dermatologist that they will be changing her antibiotic.  Outpatient Medications Prior to Visit  Medication Sig Dispense Refill  . CRYSELLE-28 0.3-30 MG-MCG tablet Take 1 tablet by mouth daily.  0  . eletriptan (RELPAX) 40 MG tablet Take 40 mg by mouth as needed for migraine. One tablet by mouth at onset of headache. May repeat in 2 hours if headache persists or recurs.    Marland Kitchen. EPINEPHrine (EPI-PEN) 0.3 mg/0.3 mL DEVI Inject 0.3 mg into the muscle as needed.    Marland Kitchen. imipramine (TOFRANIL) 25 MG tablet Take 25 mg by mouth daily.  5  . Multiple Vitamin (MULTI VITAMIN DAILY PO) Take daily by mouth.    . cephALEXin (KEFLEX) 500 MG capsule TAKE 1 CAPSULE BY MOUTH EVERY 12 HOURS FOR 10 DAYS  0   No facility-administered medications prior to visit.     ROS Review of Systems  Constitutional: Positive for fatigue. Negative for activity change, chills, fever and unexpected weight change.  HENT: Negative.  Negative for facial swelling, sinus pressure, sore throat, trouble swallowing and voice change.   Eyes: Negative.   Respiratory: Negative.  Negative for cough and shortness of breath.   Cardiovascular: Negative.  Negative for chest pain, palpitations and leg swelling.  Gastrointestinal: Negative.  Negative for abdominal pain, constipation, nausea and vomiting.  Endocrine: Negative.   Genitourinary: Negative.  Negative for difficulty  urinating.  Musculoskeletal: Negative.  Negative for myalgias and neck pain.  Skin: Positive for rash. Negative for color change.       Red area right posterior ear  Allergic/Immunologic: Negative.   Neurological: Negative.   Hematological: Negative for adenopathy. Does not bruise/bleed easily.  Psychiatric/Behavioral: Negative.     Objective:  BP 120/78 (BP Location: Left Arm, Patient Position: Sitting, Cuff Size: Normal)   Pulse 78   Temp 98 F (36.7 C) (Oral)   Resp 16   Ht 5\' 1"  (1.549 m)   Wt 130 lb 8 oz (59.2 kg)   LMP 03/23/2017   SpO2 98%   BMI 24.66 kg/m   BP Readings from Last 3 Encounters:  04/07/17 120/78  03/18/17 128/84  01/03/17 104/74    Wt Readings from Last 3 Encounters:  04/07/17 130 lb 8 oz (59.2 kg)  03/18/17 130 lb (59 kg)  01/03/17 129 lb (58.5 kg)    Physical Exam  Constitutional: She is oriented to person, place, and time. No distress.  HENT:  Mouth/Throat: Oropharynx is clear and moist. No oropharyngeal exudate.  Eyes: Conjunctivae are normal. Right eye exhibits no discharge. Left eye exhibits no discharge. No scleral icterus.  Neck: Normal range of motion. Neck supple. No JVD present. No thyromegaly present.  Cardiovascular: Normal rate, regular rhythm and normal heart sounds.  No murmur heard. Pulmonary/Chest: Effort normal and breath sounds normal. No  respiratory distress. She has no rales.  Abdominal: Soft. Bowel sounds are normal. She exhibits no mass. There is no tenderness.  Musculoskeletal: Normal range of motion. She exhibits no edema or tenderness.  Lymphadenopathy:       Head (right side): No submental, no submandibular, no preauricular, no posterior auricular and no occipital adenopathy present.       Head (left side): No submental, no submandibular, no preauricular, no posterior auricular and no occipital adenopathy present.    She has no cervical adenopathy.    She has no axillary adenopathy.       Right: No supraclavicular  adenopathy present.       Left: No supraclavicular adenopathy present.  Neurological: She is alert and oriented to person, place, and time.  Skin: Skin is warm and dry. Rash noted. She is not diaphoretic. No erythema.  Over the right, mid posterior upper distal auricle is a small scab surrounded by faint erythema.  There is no exudate, warmth, fluctuance, or streaking.  Vitals reviewed.   Lab Results  Component Value Date   WBC 5.6 04/07/2017   HGB 13.9 04/07/2017   HCT 41.3 04/07/2017   PLT 248.0 04/07/2017   GLUCOSE 88 04/07/2017   CHOL 144 04/07/2017   TRIG 97.0 04/07/2017   HDL 45.40 04/07/2017   LDLCALC 79 04/07/2017   ALT 13 04/07/2017   AST 15 04/07/2017   NA 140 04/07/2017   K 4.3 04/07/2017   CL 103 04/07/2017   CREATININE 0.85 04/07/2017   BUN 15 04/07/2017   CO2 29 04/07/2017   TSH 1.82 04/07/2017   INR 1.23 05/25/2009    Dg Chest 2 View  Result Date: 06/06/2016 CLINICAL DATA:  Patient with mid chest pain, cough and fever. EXAM: CHEST  2 VIEW COMPARISON:  Chest radiograph 03/14/2011 FINDINGS: Normal cardiac and mediastinal contours. No consolidative pulmonary opacities. No pleural effusion or pneumothorax. Regional skeleton is unremarkable. Probable nipple shadows projecting over the lower thorax bilaterally. IMPRESSION: No acute cardiopulmonary process. Electronically Signed   By: Annia Beltrew  Davis M.D.   On: 06/06/2016 11:06    Assessment & Plan:   Yamil was seen today for annual exam.  Diagnoses and all orders for this visit:  Routine general medical examination at a health care facility- Exam completed, labs reviewed, vaccines reviewed, her Pap smear is up-to-date, patient education material was given. -     Lipid panel; Future  Fatigue, unspecified type- Her examination is unremarkable and all of her labs are normal.  The fatigue appears to be related to her recent viral syndrome.  If it persists or if she develops new or different symptoms she will let me know. -      Comprehensive metabolic panel; Future -     CBC with Differential/Platelet; Future -     Thyroid Panel With TSH; Future -     Sedimentation rate; Future   I have discontinued Kaytie S. Gauthreaux's Multiple Vitamin (MULTI VITAMIN DAILY PO) and cephALEXin. I am also having her maintain her EPINEPHrine, eletriptan, CRYSELLE-28, and imipramine.  No orders of the defined types were placed in this encounter.    Follow-up: Return in about 4 weeks (around 05/05/2017).  Sanda Lingerhomas Demitrios Molyneux, MD

## 2017-04-08 ENCOUNTER — Encounter: Payer: Self-pay | Admitting: Internal Medicine

## 2017-04-08 LAB — THYROID PANEL WITH TSH
FREE THYROXINE INDEX: 2.7 (ref 1.4–3.8)
T3 Uptake: 28 % (ref 22–35)
T4, Total: 9.5 ug/dL (ref 5.1–11.9)
TSH: 1.82 m[IU]/L
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
## 2017-05-12 ENCOUNTER — Encounter: Payer: Self-pay | Admitting: Internal Medicine

## 2017-05-12 ENCOUNTER — Ambulatory Visit: Payer: BLUE CROSS/BLUE SHIELD | Admitting: Internal Medicine

## 2017-05-12 VITALS — BP 114/74 | HR 80 | Temp 98.4°F | Resp 16 | Wt 132.0 lb

## 2017-05-12 DIAGNOSIS — H6011 Cellulitis of right external ear: Secondary | ICD-10-CM

## 2017-05-12 MED ORDER — DOXYCYCLINE HYCLATE 100 MG PO TABS: 100.0000 mg | ORAL_TABLET | Freq: Two times a day (BID) | ORAL | 0 refills | Status: DC

## 2017-05-12 NOTE — Patient Instructions (Signed)
Take the doxycycline as prescribed.  Use the desonide cream as prescribed   Call if your infection does not resolve completely.

## 2017-05-12 NOTE — Assessment & Plan Note (Signed)
Mild cellulitis of right lateral ear, associated with lymphadenopathy on the right Culture was done by dermatology and it was a staph infection Infection improved and almost completely resolved with Keflex and desonide cream, but once she completed the course the infection recurred Start doxycycline twice daily times 10 days She is already restarted the desonide cream and will continue that Advised that her lymph nodes should decrease once the infection improves Call with any questions or if her symptoms do not completely resolve

## 2017-06-10 DIAGNOSIS — M79672 Pain in left foot: Secondary | ICD-10-CM | POA: Diagnosis not present

## 2017-06-10 DIAGNOSIS — M898X7 Other specified disorders of bone, ankle and foot: Secondary | ICD-10-CM | POA: Diagnosis not present

## 2017-06-10 DIAGNOSIS — M79671 Pain in right foot: Secondary | ICD-10-CM | POA: Diagnosis not present

## 2017-06-13 ENCOUNTER — Encounter: Payer: Self-pay | Admitting: Sports Medicine

## 2017-06-13 ENCOUNTER — Ambulatory Visit: Payer: BLUE CROSS/BLUE SHIELD | Admitting: Sports Medicine

## 2017-06-13 ENCOUNTER — Ambulatory Visit: Payer: Self-pay

## 2017-06-13 VITALS — BP 106/72 | HR 75 | Ht 61.0 in | Wt 131.2 lb

## 2017-06-13 DIAGNOSIS — M25521 Pain in right elbow: Secondary | ICD-10-CM

## 2017-06-13 DIAGNOSIS — D6800 Von Willebrand disease, unspecified: Secondary | ICD-10-CM

## 2017-06-13 DIAGNOSIS — M79671 Pain in right foot: Secondary | ICD-10-CM | POA: Diagnosis not present

## 2017-06-13 DIAGNOSIS — M7711 Lateral epicondylitis, right elbow: Secondary | ICD-10-CM

## 2017-06-13 DIAGNOSIS — G43809 Other migraine, not intractable, without status migrainosus: Secondary | ICD-10-CM | POA: Diagnosis not present

## 2017-06-13 MED ORDER — NITROGLYCERIN 0.2 MG/HR TD PT24: MEDICATED_PATCH | TRANSDERMAL | 1 refills | Status: DC

## 2017-06-13 NOTE — Progress Notes (Signed)
[name]. [name] at [facility] [phone]  [name] - 42 y.o. female MRN [id number]  Date of birth: 12-Apr-1976  Visit Date: 06/13/2017  PCP: Etta Grandchild, MD   Referred by: Etta Grandchild, MD   Scribe for today's visit: Christoper Fabian, LAT, ATC     SUBJECTIVE:  [name] is here for Follow-up (R elbow pain) .   Notes from last OV on 01/03/17: [name] is an established patient presenting today in follow-up of RT elbow pain. [name] was last seen 10/30/16 and was advised to use compression, pennsaid, and home exercises. [name] also has new complaint of LT wrist pain.   Pt has been compliant with Pennsaid, compression for the RT elbow and doing home exercises and reports no improvement. [name] reports little change with elbow pain since her last visit. [name] has noticed that the tingling in her hand and fingers has resolved but the pain remains the same.   LT wrist pain started about 3 weeks ago. [name] did go to the gym the morning that the pain started but cannot recall any specific incident that caused the pain. [name] noticed the pain later in that day when [name] was trying to blow dry her hair. The pain starts in the wrist and radiates into the lower arm. The pain comes and goes. Pain is worse when lifting heavy objects or doing workouts at the gym. [name] has not noticed any swelling and the pain does not radiate into the fingers.    Compared to the last office visit on 01/03/17, her previously described R elbow symptoms are worsening w/ progressively increasing pain in the R lateral elbow x one month. Current symptoms are mod-severe & are nonradiating [name] has resumed her HEP but hasn't been taking any meds or icing.  [name] states that [name] con't to wear her elbow compression sleeve when [name]'s working out at the gym and at night.  [name] has not been using the Pennsaid.  Pt states that [name] also brought x-rays of her B feet that Dr. Berline Chough asked her to  bring w/ her.   ROS Denies night time disturbances. Denies fevers, chills, or night sweats. Denies unexplained weight loss. Denies personal history of cancer. Denies changes in bowel or bladder habits. Denies recent unreported falls. Denies new or worsening dyspnea or wheezing. Reports headaches or dizziness. Yes to both.  Has a hx of migraines. Denies numbness, tingling or weakness  In the extremities.  Reports dizziness or presyncopal episodes Denies lower extremity edema     HISTORY & PERTINENT PRIOR DATA:  Prior History reviewed and updated per electronic medical record.  Significant history, findings, studies and interim changes include:  reports that  has never smoked. [name] has never used smokeless tobacco. No results for input(s): HGBA1C, LABURIC, CREATINE in the last 8760 hours. No specialty comments available. No problems updated.  OBJECTIVE:  VS:  HT:5\' 1"  (154.9 cm)   WT:131 lb 3.2 oz (59.5 kg)  BMI:24.8    BP:106/72  HR:75bpm  TEMP: ( )  RESP:98 %   PHYSICAL EXAM: Constitutional: WDWN, Non-toxic appearing. Psychiatric: Alert & appropriately interactive.  Not depressed or anxious appearing. Respiratory: No increased work of breathing.  Trachea Midline Eyes: Pupils are equal.  EOM intact without nystagmus.  No scleral icterus  NEUROVASCULAR exam: No clubbing or cyanosis appreciated No significant venous stasis changes Capillary Refill: normal, less than 2 seconds   Pain over the lateral  epicondyle and over the extensor tendons. Pain with resisted wrist extension.  Compartments are soft No significant pain over the cubital tunnel.  Bilateral feet are overall well aligned with small amount of overpronation and breakdown of the transverse arch.  ASSESSMENT & PLAN:   1. Right elbow pain   2. Foot pain, right   3. Lateral epicondylitis of right elbow   4. Other migraine without status migrainosus, not intractable   5. Von Willebrand disease (HCC)     PLAN: Long discussion today regarding options for her elbow including PRP and nitroglycerin protocol.  [name] has had a flareup secondary to overuse of the elbow and rest either way is recommended.  We discussed that nitroglycerin is worth trying can trigger her migraines and prescription was sent in to have her try for this.  If any lack of improvement [name] is interested in PRP as an alternative and also understands that an open procedure is an option but [name] would like to try to avoid this if possible.  For her feet [name] does have a small amount of flattening of the longitudinal arch and transverse arch and would benefit from OTC insoles.  Information for this was provided. No problem-specific Assessment & Plan notes found for this encounter.   ++++++++++++++++++++++++++++++++++++++++++++ Orders & Meds: Orders Placed This Encounter  Procedures  . US MSK POCT ULTRASOUND  . DG Outside Films Extremity    Meds ordered this encounter  Medications  . DISCONTD: nitroGLYCERIN (NITRODUR - DOSED IN MG/24 HR) 0.2 mg/hr patch    Sig: Place 1/4 to 1/2 of a patch over affected region. Remove and replace once daily.  Slightly alter skin placement daily    Dispense:  30 patch    Refill:  1    For musculoskeletal purposes.  Okay to cut patch.    ++++++++++++++++++++++++++++++++++++++++++++ Follow-up: Return in about 6 weeks (around 07/25/2017).   Pertinent documentation may be included in additional procedure notes, imaging studies, problem based documentation and patient instructions. Please see these sections of the encounter for additional information regarding this visit. CMA/ATC served as Neurosurgeonscribe during this visit. History, Physical, and Plan performed by medical provider. Documentation and orders reviewed and attested to.      Andrena MewsMichael D Jyquan Kenley, DO    [facility] Physician

## 2017-06-13 NOTE — Patient Instructions (Signed)
Nitroglycerin Protocol   Apply 1/4 nitroglycerin patch to affected area daily.  Change position of patch within the affected area every 24 hours.  You may experience a headache during the first 1-2 weeks of using the patch, these should subside.  If you experience headaches after beginning nitroglycerin patch treatment, you may take your preferred over the counter pain reliever.  Another side effect of the nitroglycerin patch is skin irritation or rash related to patch adhesive.  Please notify our office if you develop more severe headaches or rash, and stop the patch.  Tendon healing with nitroglycerin patch may require 12 to 24 weeks depending on the extent of injury.  Men should not use if taking Viagra, Cialis, or Levitra.   Do not use if you have migraines or rosacea.    I recommend that you obtain over-the-counter SOLE  medium cushioned insoles.  These can be found at [employer] - or on-line at [employer]  Search for "SOLE Active Medium Shoe Insoles"   Look into having your insurance company cover a set of custom orthotics.  The code is L3030 and there are 2 units.  You can call them  and ask if this is covered.  I am happy to do these for you at any time, you just need to let our front office schedulers know you would like an "orthotic appointment."

## 2017-06-16 ENCOUNTER — Ambulatory Visit: Payer: BLUE CROSS/BLUE SHIELD | Admitting: Internal Medicine

## 2017-06-16 ENCOUNTER — Encounter: Payer: Self-pay | Admitting: Internal Medicine

## 2017-06-16 DIAGNOSIS — H6011 Cellulitis of right external ear: Secondary | ICD-10-CM

## 2017-06-16 MED ORDER — CLINDAMYCIN HCL 150 MG PO CAPS: 150.0000 mg | ORAL_CAPSULE | Freq: Two times a day (BID) | ORAL | 0 refills | Status: DC

## 2017-06-16 MED ORDER — CEPHALEXIN 500 MG PO CAPS: 500.0000 mg | ORAL_CAPSULE | Freq: Three times a day (TID) | ORAL | 0 refills | Status: DC

## 2017-06-16 NOTE — Patient Instructions (Addendum)
We will send in antibiotics to eliminate this rash.   We have sent in the keflex to take for 3 weeks as well as clindamycin to take for 3 weeks.   Keflex take 1 pill 3 times per day.  Clindamycin 1 pill 2 times per day.

## 2017-06-16 NOTE — Progress Notes (Signed)
   Subjective:    Patient ID: [name], female    DOB: [birth year], 42 y.o.   MRN: [id number]  HPI The patient is a 42 YO female coming in for bumps on her ear and head. She was seen for this about 1 month ago and given 10 day course of doxycycline. This did help the area but she did have recurrence once she stopped the antibiotics. It has gradually changed over the last 2 weeks and she just did not have the time to come in until now. She denies fevers or chills. She feels some lymph nodes (bumps) in the area which are non-tender. These did resolve during antibiotic therapy. She previously had taken keflex from dermatology after biopsy of the area showed staph. She denies injury or piercing to the area. This is located on the top of the earlid. Does not spread onto the scalp or inside the ear. Denies hearing changes, ear pain, drainage symptoms. Denies sinus pressure or congestion. Denies drainage or itching in the area.   Review of Systems  Constitutional: Negative.   HENT: Positive for ear pain.   Eyes: Negative.   Respiratory: Negative for cough, chest tightness and shortness of breath.   Cardiovascular: Negative for chest pain, palpitations and leg swelling.  Gastrointestinal: Negative for abdominal distention, abdominal pain, constipation, diarrhea, nausea and vomiting.  Musculoskeletal: Negative.   Skin: Positive for rash.      Objective:   Physical Exam  Constitutional: She is oriented to person, place, and time. She appears well-developed and well-nourished.  HENT:  Head: Normocephalic and atraumatic.  Right ear TM normal and canal without redness or signs of infection, changes on the top of the ear with redness and 2 1mm spots with some skin breakdown, no purulent drainage or abscess, no swelling of the ear noticeable, no redness on the scalp or face.  Eyes: EOM are normal.  Neck: Normal range of motion.  Some post-auricular LN present and hard (non-tender)  Cardiovascular:  Normal rate and regular rhythm.  Pulmonary/Chest: Effort normal and breath sounds normal. No respiratory distress. She has no wheezes. She has no rales.  Abdominal: Soft.  Musculoskeletal: She exhibits no edema.  Lymphadenopathy:    She has cervical adenopathy.  Neurological: She is alert and oriented to person, place, and time. Coordination normal.  Skin: Skin is warm and dry.   Vitals:   06/16/17 1331  BP: 100/70  Pulse: 81  Temp: 97.7 F (36.5 C)  TempSrc: Oral  SpO2: 97%  Weight: 132 lb (59.9 kg)  Height: 5\' 1"  (1.549 m)      Assessment & Plan:

## 2017-06-16 NOTE — Assessment & Plan Note (Signed)
Recurrence after doxycycline. Will retreat with extended course of keflex and clindamycin given treatment failure times 2. If this does not resolve or recurs needs to see PCP for evaluate of underlying cause or suppressive therapy.

## 2017-06-17 ENCOUNTER — Ambulatory Visit: Payer: Self-pay

## 2017-06-17 ENCOUNTER — Ambulatory Visit: Payer: BLUE CROSS/BLUE SHIELD | Admitting: Sports Medicine

## 2017-06-17 ENCOUNTER — Encounter: Payer: Self-pay | Admitting: Sports Medicine

## 2017-06-17 VITALS — BP 116/74 | HR 71 | Ht 61.0 in | Wt 130.6 lb

## 2017-06-17 DIAGNOSIS — G5621 Lesion of ulnar nerve, right upper limb: Secondary | ICD-10-CM

## 2017-06-17 DIAGNOSIS — M25521 Pain in right elbow: Secondary | ICD-10-CM

## 2017-06-17 DIAGNOSIS — M7711 Lateral epicondylitis, right elbow: Secondary | ICD-10-CM

## 2017-06-17 MED ORDER — TRAMADOL HCL 50 MG PO TABS: 50.0000 mg | ORAL_TABLET | Freq: Four times a day (QID) | ORAL | 0 refills | Status: DC | PRN

## 2017-06-17 NOTE — Assessment & Plan Note (Signed)
PRP injection performed today.  Continue compression and shoulder arm immobilization. Follow-up 2 weeks for repeat MSK ultrasound and enhancement of therapeutic exercises Avoiding any type of lifting or use of the right arm encouraged until follow-up.  Discussed 4-6-week return to activities.

## 2017-06-17 NOTE — Patient Instructions (Addendum)
You had an injection today.  Things to be aware of after injection are listed below: . You may experience no significant improvement or even a slight worsening in your symptoms during the first 24 to 48 hours.  After that we expect your symptoms to improve gradually over the next 2 weeks for the medicine to have its maximal effect.  You should continue to have improvement out to 6 weeks after your injection. . Dr. Rigby recommends icing the site of the injection for 20 minutes  1-2 times the day of your injection . You may shower but no swimming, tub bath or Jacuzzi for 24 hours. . If your bandage falls off this does not need to be replaced.  It is appropriate to remove the bandage after 4 hours. . You may resume light activities as tolerated unless otherwise directed per Dr. Rigby during your visit    POSSIBLE PROCEDURE SIDE EFFECTS: The side effects of the injection are usually fairly minimal however if you may experience some of the following side effects that are usually self-limited and will is off on their own.  If you are concerned please feel free to call the office with questions:  Increased numbness or tingling  Nausea or vomiting  Swelling or bruising at the injection site   Please call our office if if you experience any of the following symptoms over the next week as these can be signs of infection:   Fever greater than 100.5F  Significant swelling at the injection site  Significant redness or drainage from the injection site  If after 2 weeks you are continuing to have worsening symptoms please call our office to discuss what the next appropriate actions should be including the potential for a return office visit or other diagnostic testing.    

## 2017-06-17 NOTE — Procedures (Signed)
PROCEDURE NOTE:  Ultrasound Guided: Injection: Right lateral epicondyle, PRP injection Images were obtained and interpreted by myself, Gaspar BiddingMichael Rigby, DO  Images have been saved and stored to PACS system. Images obtained on: GE S7 Ultrasound machine  ULTRASOUND FINDINGS:  Improved neovascularity and hypoechoic change compared to last week but still persistent interstitial splitting of the extensor tendons  DESCRIPTION OF PROCEDURE:  The patient's clinical condition is marked by substantial pain and/or significant functional disability. Other conservative therapy has not provided relief, is contraindicated, or not appropriate. There is a reasonable likelihood that injection will significantly improve the patient's pain and/or functional impairment.  After discussing the risks, benefits and expected outcomes of the injection and all questions were reviewed and answered, the patient wished to undergo the above named procedure. Verbal consent was obtained.  The ultrasound was used to identify the target structure and adjacent neurovascular structures. The skin was then prepped in sterile fashion and the target structure was injected under direct visualization using sterile technique as below:   PREP: Alcohol, Ethel Chloride,  APPROACH: direct, stopcock technique, 21g 2in. INJECTATE: 2cc: 1% lidocaine, 2cc: 0.5% marcaine, 3cc: Patient's own platelet rich plasma obtained using the peak PRP protocol per manufacturer's recommendations   ASPIRATE: None   DRESSING: Band-Aid &: 4-inch Ace Wrap  Post procedural instructions including recommending icing and warning signs for infection were reviewed.   This procedure was well tolerated and there were no complications.     IMPRESSION: Succesful Ultrasound Guided: Injection

## 2017-06-17 NOTE — Progress Notes (Signed)
  Veverly FellsMichael D. Delorise Shinerigby, DO  [facility] [place] [phone]  Beverlee Rondel BatonS Flood - 42 y.o. female MRN [id number]  Date of birth: Oct 12, 1975  Visit Date: 06/17/2017  PCP: Etta GrandchildJones, Thomas L, MD   Referred by: Etta GrandchildJones, Thomas L, MD   Scribe for today's visit: Christoper FabianMolly Weber, LAT, ATC     SUBJECTIVE:  Carmelia BakeAmy S Mow is here for Follow-up (R lat epicondylitis and PRP injection) .   Compared to the last office visit on 06/13/17, her previously described R lateral elbow symptoms are worsening  Current symptoms are mod-severe & are radiating to R proximal forearm. She tried the nitroglycerin patches but had a terrible HA x 2 days so stopped the patches.   ROS Denies night time disturbances. Denies fevers, chills, or night sweats. Denies unexplained weight loss. Denies personal history of cancer. Denies changes in bowel or bladder habits. Denies recent unreported falls. Denies new or worsening dyspnea or wheezing. Reports headaches or dizziness.  Reports numbness, tingling or weakness  In the extremities.  Denies dizziness or presyncopal episodes Denies lower extremity edema     HISTORY & PERTINENT PRIOR DATA:  Prior History reviewed and updated per electronic medical record.  Significant history, findings, studies and interim changes include:  reports that  has never smoked. she has never used smokeless tobacco. No results for input(s): HGBA1C, LABURIC, CREATINE in the last 8760 hours. No specialty comments available. Problem  Lateral Epicondylitis of Right Elbow   PRP injection 06/17/2017     OBJECTIVE:  VS:  HT:5\' 1"  (154.9 cm)   WT:130 lb 9.6 oz (59.2 kg)  BMI:24.69    BP:116/74  HR:71bpm  TEMP: ( )  RESP:97 %   PHYSICAL EXAM: Constitutional: WDWN, Non-toxic appearing. Psychiatric: Alert & appropriately interactive.  Not depressed or anxious appearing. Respiratory: No increased work of breathing.  Trachea Midline Eyes: Pupils are equal.   EOM intact without nystagmus.  No scleral icterus  NEUROVASCULAR exam: No clubbing or cyanosis appreciated No significant venous stasis changes Capillary Refill: normal, less than 2 seconds   Moderate TTP over the lateral condyle. No significant overlying skin changes.  No additional findings.   ASSESSMENT & PLAN:   1. Lateral epicondylitis of right elbow   2. Right elbow pain    PLAN:    Lateral epicondylitis of right elbow PRP injection performed today.  Continue compression and shoulder arm immobilization. Follow-up 2 weeks for repeat MSK ultrasound and enhancement of therapeutic exercises Avoiding any type of lifting or use of the right arm encouraged until follow-up.  Discussed 4-6-week return to activities.  Follow-up: Return in about 2 weeks (around 07/01/2017).   Pertinent documentation may be included in additional procedure notes, imaging studies, problem based documentation and patient instructions. Please see these sections of the encounter for additional information regarding this visit. CMA/ATC served as Neurosurgeonscribe during this visit. History, Physical, and Plan performed by medical provider. Documentation and orders reviewed and attested to.      Andrena MewsMichael D Rigby, DO    [facility]

## 2017-06-24 ENCOUNTER — Encounter: Payer: Self-pay | Admitting: Sports Medicine

## 2017-06-24 NOTE — Procedures (Signed)
LIMITED MSK ULTRASOUND OF Right Elbow Images were obtained and interpreted by myself, Gaspar BiddingMichael Karl Knarr, DO  Images have been saved and stored to PACS system. Images obtained on: GE S7 Ultrasound machine  FINDINGS:   Significant swelling and hypoechoic change surrounding the lateral condyle and specifically within the common extensor tendon.  This is markedly more swollen than in the past.  No appreciable interstitial split today although there is some disruption of the fibrillar structure.  IMPRESSION:  1. 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

## 2017-06-30 DIAGNOSIS — D485 Neoplasm of uncertain behavior of skin: Secondary | ICD-10-CM | POA: Diagnosis not present

## 2017-06-30 DIAGNOSIS — Z86018 Personal history of other benign neoplasm: Secondary | ICD-10-CM | POA: Diagnosis not present

## 2017-06-30 DIAGNOSIS — D1801 Hemangioma of skin and subcutaneous tissue: Secondary | ICD-10-CM | POA: Diagnosis not present

## 2017-06-30 DIAGNOSIS — B350 Tinea barbae and tinea capitis: Secondary | ICD-10-CM | POA: Diagnosis not present

## 2017-06-30 DIAGNOSIS — D225 Melanocytic nevi of trunk: Secondary | ICD-10-CM | POA: Diagnosis not present

## 2017-07-01 ENCOUNTER — Ambulatory Visit: Payer: Self-pay

## 2017-07-01 ENCOUNTER — Encounter: Payer: Self-pay | Admitting: Sports Medicine

## 2017-07-01 ENCOUNTER — Ambulatory Visit: Payer: BLUE CROSS/BLUE SHIELD | Admitting: Sports Medicine

## 2017-07-01 VITALS — BP 116/74 | HR 87 | Ht 61.0 in | Wt 130.6 lb

## 2017-07-01 DIAGNOSIS — M7711 Lateral epicondylitis, right elbow: Secondary | ICD-10-CM | POA: Diagnosis not present

## 2017-07-01 NOTE — Procedures (Signed)
LIMITED MSK ULTRASOUND OF right elbow Images were obtained and interpreted by myself, Gaspar BiddingMichael Rigby, DO  Images have been saved and stored to PACS system. Images obtained on: GE S7 Ultrasound machine  FINDINGS:   Common extensor tendon is appropriately attached with a normal-appearing origin with hypoechoic change within the musculotendinous junction.    There is marked neovascularity within the hypoechoic change consistent with robust healing respons  IInterstitial split/longitudinal split previously seen has resolved and is not appreciated on exam today.   IMPRESSION:  1. 2 weeks status post PRP injection right elbow with overall appropriate response and intact tendon

## 2017-07-01 NOTE — Progress Notes (Signed)
Veverly FellsMichael D. Delorise Shinerigby, DO  [facility] [place] [phone]  Sherlynn Rondel BatonS Gartrell - 42 y.o. female MRN [id number]  Date of birth: [birth year]  Visit Date: 07/01/2017  PCP: Etta GrandchildJones, Thomas L, MD   Referred by: Etta GrandchildJones, Thomas L, MD   Scribe for today's visit: Stevenson ClinchBrandy Coleman, CMA     SUBJECTIVE:  Carmelia Bakemy S Reali is here for Follow-up (lateral epicondylitis of R elbow)  09/25/16: Pt presents today for right elbow pain Pain started the week of May 7th She was helping her son learn to ride a bike when she first noticed the pain. She was holding the back of the bike.  The pain is described as shooting and stabbing and is rated as 7/10 with movement. Worsened with bending arm, lifting, pouring Improves with rest Therapies tried include icing Other associated symptoms include: Pr denies radiating pain into the upper or lower arm and she denies tingling in the fingers.  Pt has not had any recent imaging.  Pt denies fever, chills, night sweats.   6 27/18: Pt presents today in follow-up of right elbow pain, tennis elbow.  She had ultrasound on 09/25/16. She was given home therapeutic exercises and a Body Helix compression sleeve.  She was experiencing some numbness in the right elbow last week but reports that the feeling has come back. She doesn't recall any particular activity that would have caused the numbness, she says it just went numb from the elbow to the fingers. The numbness lasted for a couple of days and resolved. She is not having any trouble with the numbness right now but she is still feeling the pain in her right elbow. She has no pain when at rest but still has a lot of pain with movement. She feels like the pain is getting worse instead of better.  Pt denies fever, chills, night sweats.   01/03/17: Follow-up of RT elbow pain. She was last seen 10/30/16 and was advised to use compression, pennsaid, and home exercises. She also has new complaint of LT wrist  pain.  Pt has been compliant with Pennsaid, compression for the RT elbow and doing home exercises and reports no improvement. She reports little change with elbow pain since her last visit. She has noticed that the tingling in her hand and fingers has resolved but the pain remains the same.  LT wrist pain started about 3 weeks ago. She did go to the gym the morning that the pain started but cannot recall any specific incident that caused the pain. She noticed the pain later in that day when she was trying to blow dry her hair. The pain starts in the wrist and radiates into the lower arm. The pain comes and goes. Pain is worse when lifting heavy objects or doing workouts at the gym. She has not noticed any swelling and the pain does not radiate into the fingers.    06/13/17: Compared to the last office visit on 01/03/17, her previously described R elbow symptoms are worsening w/ progressively increasing pain in the R lateral elbow x one month. Current symptoms are mod-severe & are nonradiating She has resumed her HEP but hasn't been taking any meds or icing.  She states that she con't to wear her elbow compression sleeve when she's working out at the gym and at night.  She has not been using the Pennsaid. Pt states that she also brought x-rays of her B feet that Dr. Berline Choughigby asked her to bring  w/ her.  06/17/17: Compared to the last office visit on 06/13/17, her previously described R lateral elbow symptoms are worsening  Current symptoms are mod-severe & are radiating to R proximal forearm. She tried the nitroglycerin patches but had a terrible HA x 2 days so stopped the patches.   07/01/17: Compared to the last office visit, her previously described symptoms show no change. Initially pain was worse but has gotten back to about how it felt prior to injection.  Current symptoms are moderate & are radiating to R proximal forearm.  She received PRP inj 06/17/17 and was prescribed Tramadol to take prn for  the pain. She has been wearing sling when out in a crowded place or when she knows that she will be using her arm a lot.    ROS Denies night time disturbances. Denies fevers, chills, or night sweats. Denies unexplained weight loss. Denies personal history of cancer.   HISTORY & PERTINENT PRIOR DATA:  Prior History reviewed and updated per electronic medical record.  Significant history, findings, studies and interim changes include:  reports that  has never smoked. she has never used smokeless tobacco. No results for input(s): HGBA1C, LABURIC, CREATINE in the last 8760 hours. No specialty comments available. Problem  Lateral Epicondylitis of Right Elbow   PRP injection 06/17/2017     OBJECTIVE:  VS:  HT:5\' 1"  (154.9 cm)   WT:130 lb 9.6 oz (59.2 kg)  BMI:24.69    BP:116/74  HR:87bpm  TEMP: ( )  RESP:99 %   PHYSICAL EXAM: Constitutional: WDWN, Non-toxic appearing. Psychiatric: Alert & appropriately interactive.  Not depressed or anxious appearing. Respiratory: No increased work of breathing.  Trachea Midline Eyes: Pupils are equal.  EOM intact without nystagmus.  No scleral icterus  NEUROVASCULAR exam: No clubbing or cyanosis appreciated No significant venous stasis changes Capillary Refill: normal, less than 2 seconds   Right elbow: Overall well aligned.  No overlying skin changes.  She has some persistent pain with palpation of the lateral condyle but this is mild.   ASSESSMENT & PLAN:   1. Lateral epicondylitis of right elbow    ++++++++++++++++++++++++++++++++++++++++++++ Orders & Meds:  Orders Placed This Encounter  Procedures  . [place] MSK POCT ULTRASOUND   No orders of the defined types were placed in this encounter.   ++++++++++++++++++++++++++++++++++++++++++++ PLAN:   No additional findings.  Lateral epicondylitis of right elbow This is doing remarkably well and healing as expected following PRP injection 2 weeks ago.  We will have her continue with  compression when active and at night as needed but can wean out of the sling.  Additional 2 weeks of avoidance of use is recommended.  Discussed appropriate healing response and in 2 weeks we will plan to progress her activities did emphasize that this is a 6-week healing process   Follow-up: Return in about 2 weeks (around 07/15/2017).   Pertinent documentation may be included in additional procedure notes, imaging studies, problem based documentation and patient instructions. Please see these sections of the encounter for additional information regarding this visit. CMA/ATC served as Neurosurgeon during this visit. History, Physical, and Plan performed by medical provider. Documentation and orders reviewed and attested to.      Andrena Mews, DO    [facility]

## 2017-07-01 NOTE — Assessment & Plan Note (Signed)
This is doing remarkably well and healing as expected following PRP injection 2 weeks ago.  We will have her continue with compression when active and at night as needed but can wean out of the sling.  Additional 2 weeks of avoidance of use is recommended.  Discussed appropriate healing response and in 2 weeks we will plan to progress her activities did emphasize that this is a 6-week healing process

## 2017-07-03 ENCOUNTER — Ambulatory Visit (INDEPENDENT_AMBULATORY_CARE_PROVIDER_SITE_OTHER): Payer: BLUE CROSS/BLUE SHIELD | Admitting: Family Medicine

## 2017-07-03 ENCOUNTER — Other Ambulatory Visit: Payer: BLUE CROSS/BLUE SHIELD

## 2017-07-03 ENCOUNTER — Encounter: Payer: Self-pay | Admitting: Family Medicine

## 2017-07-03 ENCOUNTER — Telehealth: Payer: Self-pay | Admitting: Family Medicine

## 2017-07-03 VITALS — BP 126/82 | HR 86 | Temp 99.1°F | Ht 61.0 in | Wt 130.0 lb

## 2017-07-03 DIAGNOSIS — N898 Other specified noninflammatory disorders of vagina: Secondary | ICD-10-CM | POA: Diagnosis not present

## 2017-07-03 DIAGNOSIS — R590 Localized enlarged lymph nodes: Secondary | ICD-10-CM

## 2017-07-03 LAB — WET PREP, GENITAL
Bacteria: NONE SEEN — AB
Clue Cells Wet Prep HPF POC: NONE SEEN — AB
Trich, Wet Prep: NONE SEEN — AB
WBC, Wet Prep HPF POC: NONE SEEN — AB
Yeast Wet Prep HPF POC: NONE SEEN — AB

## 2017-07-03 MED ORDER — FLUCONAZOLE 150 MG PO TABS: 150.0000 mg | ORAL_TABLET | Freq: Once | ORAL | 0 refills | Status: AC

## 2017-07-03 NOTE — Patient Instructions (Signed)
I will call with results from today. I have made a referral to ear nose and throat doctor. They should call to make an appointment.

## 2017-07-03 NOTE — Telephone Encounter (Signed)
Spoke with patient about her results. Will try diflucan as she is symptomatic   Myra RudeSchmitz, Ishaaq Penna E, MD [facility] 07/03/2017, 1:06 PM
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
## 2017-07-04 DIAGNOSIS — N898 Other specified noninflammatory disorders of vagina: Secondary | ICD-10-CM | POA: Insufficient documentation

## 2017-07-04 NOTE — Assessment & Plan Note (Signed)
Likely related to her recent antibiotic use. - Wet prep today - Advised to finish out her antibiotics

## 2017-07-04 NOTE — Assessment & Plan Note (Signed)
Unclear if this is associated with the infection that has been ongoing on her right ear or related to another source. - Referral to ENT

## 2017-07-07 DIAGNOSIS — R590 Localized enlarged lymph nodes: Secondary | ICD-10-CM | POA: Diagnosis not present

## 2017-07-07 DIAGNOSIS — R591 Generalized enlarged lymph nodes: Secondary | ICD-10-CM | POA: Insufficient documentation

## 2017-07-15 DIAGNOSIS — D2372 Other benign neoplasm of skin of left lower limb, including hip: Secondary | ICD-10-CM | POA: Diagnosis not present

## 2017-07-16 ENCOUNTER — Encounter: Payer: Self-pay | Admitting: Sports Medicine

## 2017-07-16 ENCOUNTER — Ambulatory Visit: Payer: BLUE CROSS/BLUE SHIELD | Admitting: Sports Medicine

## 2017-07-16 ENCOUNTER — Ambulatory Visit: Payer: Self-pay

## 2017-07-16 VITALS — BP 104/80 | HR 94 | Ht 61.0 in | Wt 131.8 lb

## 2017-07-16 DIAGNOSIS — H10823 Rosacea conjunctivitis, bilateral: Secondary | ICD-10-CM | POA: Diagnosis not present

## 2017-07-16 DIAGNOSIS — M7711 Lateral epicondylitis, right elbow: Secondary | ICD-10-CM | POA: Diagnosis not present

## 2017-07-16 NOTE — Procedures (Signed)
LIMITED MSK ULTRASOUND OF Right elbow Images were obtained and interpreted by myself, Gaspar BiddingMichael Lynleigh Kovack, DO  Images have been saved and stored to PACS system. Images obtained on: GE S7 Ultrasound machine  FINDINGS:   Lateral epicondyle with increased hypoechoic change within the common extensor tendon with a small fragment in the inferior portion that has improved.  There is no pocket of fluid appreciated in marked increased neovascularity.  marked improvements in fibular architecture significant improvement in neovascularity  IMPRESSION:  1. Healing lateral epicondylosis

## 2017-07-16 NOTE — Progress Notes (Signed)
  OBJECTIVE:  VS:  HT:5\' 1"  (154.9 cm)   WT:131 lb 12.8 oz (59.8 kg)  BMI:24.92    BP:104/80  HR:94bpm  TEMP: ( )  RESP:99 %   PHYSICAL EXAM: WDWN, Non-toxic appearing. Psychiatric: Alert & appropriately interactive.  Not depressed or anxious appearing. Respiratory: No increased work of breathing.  Trachea Midline Eyes: Pupils are equal.  EOM intact without nystagmus.  No scleral icterus  NEUROVASCULAR exam: No clubbing or cyanosis appreciated No significant venous stasis changes normal, less than 2 seconds   Right elbow is overall normal-appearing without significant swelling edema or skin rash.  She does have some pain with resisted wrist extension and slight limitations in full elbow extension it does improve with gentle range of motion and myofascial release.  She has full extension after brief manipulation.  Radial pulses intact.  ASSESSMENT & PLAN:   1. Lateral epicondylitis of right elbow     Orders & Meds:  Orders Placed This Encounter  Procedures  . US MSK POCT ULTRASOUND   No orders of the defined types were placed in this encounter.   PLAN:   Lateral epicondylitis of right elbow She is doing well although she is continuing to have moderate to severe amount of pain with certain movements but she is trying to avoid this.  We did discuss that 4 weeks it is still appropriate still be having pain intermittently.  I expect over the next 2 weeks to see gradual improvement.  Her ultrasound did show significant increased neovascularity which is reassuring from a healing standpoint and I suspect that this may do quite well.  No significant splitting persistently present on ultrasound.   Follow-up: Return in about 2 weeks (around 07/30/2017) for repeat diagnostic ultrasound.

## 2017-07-16 NOTE — Progress Notes (Signed)
  Veverly FellsMichael D. Delorise Shinerigby, DO  [facility] [place] [phone]  [name] - 42 y.o. female MRN [id number]  Date of birth: 06/05/1975  Visit Date: 07/16/2017  PCP: Etta GrandchildJones, Thomas L, MD   Referred by: Etta GrandchildJones, Thomas L, MD   Scribe for today's visit: Stevenson ClinchBrandy Coleman, CMA     SUBJECTIVE:  [name] is here for Follow-up (R elbow pain)  07/01/17: Compared to the last office visit, her previously described symptoms show no change. Initially pain was worse but has gotten back to about how it felt prior to injection.  Current symptoms are moderate & are radiating to R proximal forearm.  She received PRP inj 06/17/17 and was prescribed Tramadol to take prn for the pain. She has been wearing sling when out in a crowded place or when she knows that she will be using her arm a lot.   07/16/17: Compared to the last office visit, her previously described symptoms show no change. Has pain with 2 lb weight exercises.  Current symptoms are moderate & are nonradiating. Pain seems to be worse with cold weather.  She has been wearing Body Helix compression sleeve. She was prescribed Tramadol but has not been taking it.    ROS Denies night time disturbances. Denies fevers, chills, or night sweats. Denies unexplained weight loss. Denies personal history of cancer. Denies changes in bowel or bladder habits. Denies recent unreported falls. Denies new or worsening dyspnea or wheezing. Denies headaches or dizziness.  Denies numbness, tingling or weakness  In the extremities.  Denies dizziness or presyncopal episodes Denies lower extremity edema    HISTORY & PERTINENT PRIOR DATA:  Prior History reviewed and updated per electronic medical record.  Significant history, findings, studies and interim changes include:  reports that  has never smoked. she has never used smokeless tobacco. No results for input(s): HGBA1C, LABURIC, CREATINE in the last 8760 hours. No  specialty comments available. Problem  Lateral Epicondylitis of Right Elbow   PRP injection 06/17/2017        Please see additional documentation for Objective, Assessment and Plan sections. Pertinent additional documentation may be included in corresponding procedure notes, imaging studies, problem based documentation and patient instructions. Please see these sections of the encounter for additional information regarding this visit.  CMA/ATC served as Neurosurgeonscribe during this visit. History, Physical, and Plan performed by medical provider. Documentation and orders reviewed and attested to.      Andrena MewsMichael D Marthe Dant, DO    [facility] Physician

## 2017-07-16 NOTE — Assessment & Plan Note (Signed)
She is doing well although she is continuing to have moderate to severe amount of pain with certain movements but she is trying to avoid this.  We did discuss that 4 weeks it is still appropriate still be having pain intermittently.  I expect over the next 2 weeks to see gradual improvement.  Her ultrasound did show significant increased neovascularity which is reassuring from a healing standpoint and I suspect that this may do quite well.  No significant splitting persistently present on ultrasound.

## 2017-07-18 DIAGNOSIS — G43019 Migraine without aura, intractable, without status migrainosus: Secondary | ICD-10-CM | POA: Diagnosis not present

## 2017-07-18 DIAGNOSIS — G43719 Chronic migraine without aura, intractable, without status migrainosus: Secondary | ICD-10-CM | POA: Diagnosis not present

## 2017-07-23 DIAGNOSIS — M79674 Pain in right toe(s): Secondary | ICD-10-CM | POA: Diagnosis not present

## 2017-07-25 ENCOUNTER — Ambulatory Visit: Payer: BLUE CROSS/BLUE SHIELD | Admitting: Sports Medicine

## 2017-07-29 ENCOUNTER — Ambulatory Visit: Payer: BLUE CROSS/BLUE SHIELD | Admitting: Sports Medicine

## 2017-07-29 DIAGNOSIS — H10823 Rosacea conjunctivitis, bilateral: Secondary | ICD-10-CM | POA: Diagnosis not present

## 2017-07-30 ENCOUNTER — Encounter: Payer: Self-pay | Admitting: Sports Medicine

## 2017-07-30 ENCOUNTER — Ambulatory Visit: Payer: Self-pay

## 2017-07-30 ENCOUNTER — Ambulatory Visit: Payer: BLUE CROSS/BLUE SHIELD | Admitting: Sports Medicine

## 2017-07-30 VITALS — BP 110/80 | HR 71 | Ht 61.0 in | Wt 132.6 lb

## 2017-07-30 DIAGNOSIS — M7711 Lateral epicondylitis, right elbow: Secondary | ICD-10-CM

## 2017-07-30 DIAGNOSIS — M79671 Pain in right foot: Secondary | ICD-10-CM | POA: Diagnosis not present

## 2017-07-30 DIAGNOSIS — G43809 Other migraine, not intractable, without status migrainosus: Secondary | ICD-10-CM | POA: Diagnosis not present

## 2017-07-30 DIAGNOSIS — M25521 Pain in right elbow: Secondary | ICD-10-CM | POA: Diagnosis not present

## 2017-07-30 DIAGNOSIS — D709 Neutropenia, unspecified: Secondary | ICD-10-CM | POA: Diagnosis not present

## 2017-07-30 DIAGNOSIS — R590 Localized enlarged lymph nodes: Secondary | ICD-10-CM

## 2017-07-30 DIAGNOSIS — H6011 Cellulitis of right external ear: Secondary | ICD-10-CM

## 2017-07-30 LAB — CBC WITH DIFFERENTIAL/PLATELET
BASOS ABS: 0 10*3/uL (ref 0.0–0.1)
Basophils Relative: 0.4 % (ref 0.0–3.0)
EOS ABS: 0 10*3/uL (ref 0.0–0.7)
Eosinophils Relative: 0.8 % (ref 0.0–5.0)
HEMATOCRIT: 42 % (ref 36.0–46.0)
HEMOGLOBIN: 14.4 g/dL (ref 12.0–15.0)
Lymphs Abs: 1.9 10*3/uL (ref 0.7–4.0)
MCHC: 34.3 g/dL (ref 30.0–36.0)
MCV: 88.1 fl (ref 78.0–100.0)
MONO ABS: 0.3 10*3/uL (ref 0.1–1.0)
Monocytes Relative: 8.2 % (ref 3.0–12.0)
Neutro Abs: 1 10*3/uL — ABNORMAL LOW (ref 1.4–7.7)
Neutrophils Relative %: 31.9 % — ABNORMAL LOW (ref 43.0–77.0)
Platelets: 226 10*3/uL (ref 150.0–400.0)
RBC: 4.77 Mil/uL (ref 3.87–5.11)
RDW: 12.3 % (ref 11.5–15.5)
WBC: 3.2 10*3/uL — AB (ref 4.0–10.5)

## 2017-07-30 LAB — SEDIMENTATION RATE: SED RATE: 10 mm/h (ref 0–20)

## 2017-07-30 LAB — COMPREHENSIVE METABOLIC PANEL
ALBUMIN: 4.2 g/dL (ref 3.5–5.2)
ALT: 13 U/L (ref 0–35)
AST: 18 U/L (ref 0–37)
Alkaline Phosphatase: 41 U/L (ref 39–117)
BILIRUBIN TOTAL: 1 mg/dL (ref 0.2–1.2)
BUN: 11 mg/dL (ref 6–23)
CALCIUM: 9.6 mg/dL (ref 8.4–10.5)
CO2: 29 mEq/L (ref 19–32)
CREATININE: 0.7 mg/dL (ref 0.40–1.20)
Chloride: 104 mEq/L (ref 96–112)
GFR: 98.81 mL/min (ref >60.00)
Glucose, Bld: 87 mg/dL (ref 70–99)
Potassium: 4.2 mEq/L (ref 3.5–5.1)
SODIUM: 139 meq/L (ref 135–145)
TOTAL PROTEIN: 7.9 g/dL (ref 6.0–8.3)

## 2017-07-30 LAB — URIC ACID: Uric Acid, Serum: 4.4 mg/dL (ref 2.4–7.0)

## 2017-07-30 LAB — C-REACTIVE PROTEIN: CRP: 0.3 mg/dL — AB (ref 0.5–20.0)

## 2017-07-30 MED ORDER — PREDNISONE 20 MG PO TABS
ORAL_TABLET | ORAL | 0 refills | Status: DC
Start: 2017-07-30 — End: 2017-09-12

## 2017-07-30 NOTE — Progress Notes (Signed)
[name]. Rigby, Danielle at [facility] Matthews - 42 y.o. female MRN [id number]  Date of birth: February 24, 1976  Visit Date: 07/30/2017  PCP: Janith Lima, MD   Referred by: Janith Lima, MD  Scribe for today's visit: Josepha Pigg, CMA     SUBJECTIVE:  [name] is here for Follow-up (lateral epicondylitis)  07/01/17: Compared to the last office visit, her previously described symptoms show no change. Initially pain was worse but has gotten back to about how it felt prior to injection.  Current symptoms are moderate & are radiating to R proximal forearm.  She received PRP inj 06/17/17 and was prescribed Tramadol to take prn for the pain. She has been wearing sling when out in a crowded place or when she knows that she will be using her arm a lot.   07/16/17: Compared to the last office visit, her previously described symptoms show no change. Has pain with 2 lb weight exercises.  Current symptoms are moderate & are nonradiating. Pain seems to be worse with cold weather.  She has been wearing Body Helix compression sleeve. She was prescribed Tramadol but has not been taking it.   07/30/2017: Compared to the last office visit, her previously described symptoms are improving, still has some discomfort when lifting objects.  Current symptoms are mild & are radiating to forearm when lifting heavy objects.  She has been wearing Body Helix compression sleeve. She is not currently taking anything for pain.   Pt reports swollen lymph nodes behind neck and ears. Sores on scalp and gums, she was prescribed Ketoconazole shampoo and Clindamycin for her scalp and Fluocinonide for her gums/mouth. Culture was done of scalp at dermatology but she hasn't heard back regarding results yet Ocular rosacea, prescribe and completed Acuicyn Tobradex.   infection of toe and finger, she was prescribed Cephalexin.  She had a staph infection on  her ear and was advised to use Desonide.  This has all occurred since this past November 2018.   She has hx of migraines. No unusual rashes on her face. She denies diarrhea/constipation.   She is not fasting today.    Review of Systems  Constitutional: Negative for malaise/fatigue.  HENT: Positive for ear discharge.   Eyes: Positive for pain. Negative for blurred vision.  Respiratory: Negative for shortness of breath and wheezing.   Gastrointestinal: Negative for constipation, diarrhea, nausea and vomiting.  Musculoskeletal: Negative for joint pain and myalgias.  Skin: Positive for rash.  Neurological: Positive for headaches.  Endo/Heme/Allergies: Bruises/bleeds easily.      HISTORY & PERTINENT PRIOR DATA:  Prior History reviewed and updated per electronic medical record.  Significant/pertinent history, findings, studies include:  reports that she has never smoked. She has never used smokeless tobacco. Recent Labs    07/30/17 1102  LABURIC 4.4   No specialty comments available. Problem  Hyperbilirubinemia   2014 -unclear etiology, spontaneously resolved.   Neutropenia (H[facility])  Lateral Epicondylitis of Right Elbow   PRP injection 06/17/2017   Posterior Auricular Lymphadenopathy    OBJECTIVE:  VS:  HT:'5\' 1"'$  (154.9 cm)   WT:132 lb 9.6 oz (60.1 kg)  BMI:25.07    BP:110/80  HR:71bpm  TEMP: ( )  RESP:96 %   PHYSICAL EXAM: Constitutional: WDWN, Non-toxic appearing. Psychiatric: Alert & appropriately interactive.  Not depressed or anxious appearing. Respiratory: No increased work of breathing.  Trachea Midline Eyes: Pupils are equal.  EOM  intact without nystagmus.  No scleral icterus  VASCULAR: warm to touch Radial Pulses: normal calf supple with no pain with squeeze upper and lower extremity neuro exam: unremarkable  Right elbow with a small amount of persistent pain with resisted wrist extension and with palpation over the lateral epicondyles.  Full flexion  extension of the elbow.  Scalp has a lichenified scale throughout multiple regions of the scalp.  Small posterior auricular lymph nodes bilaterally that are slightly tender to palpation.  Right hand has a paronychial inflammation without overt streaking or fluctuance  Right great toe has marked swelling of the paronychia without significant fluctuance but moderate degree of pain.  No significant surrounding erythema or streaking erythema.      ASSESSMENT & PLAN:   1. Lateral epicondylitis of right elbow   2. Right elbow pain   3. Foot pain, right   4. Posterior auricular lymphadenopathy   5. Cellulitis of right ear   6. Hyperbilirubinemia   7. Neutropenia, unspecified type ([place])   8. Other migraine without status migrainosus, not intractable     PLAN:   Steroid taper discussed.  Suspect there is some underlying autoimmune component to multitude of her skin rashes as they have not been responding to multiple antibiotics and there are multisystem involvement.  She does have a family history that is strong for autoimmune conditions and fairly extensive lab work will be obtained.       Follow-up: Return for as needed after lab results.  Otherwise, 6 weeks for your R elbow.   Lateral epicondylitis of right elbow Overall doing as expected.  Some persistent pain but overall good improvement.  She does have persistent neovascular changes as well as small amount of hypoechoic change persistently within the common extensor tendon but is 6 weeks out and tissue should be strong enough to resume activities as tolerated with continuing therapeutic exercises.  Continue with compression and icing.  Possible slow response due to underlying other medical issues as outlined above.  Neutropenia ([place]) Reflex labs to ANCA and HIV but most likely secondary to prolonged cephalosporin use.  If any lack of definitive cause bone marrow biopsy may be indicated but referral to hematology will need to be  discussed.  Posterior auricular lymphadenopathy This is been present for quite some time.  Given ongoing symptoms and involvement of the scalp, right hand and great toe further diagnostic evaluation with repeat lab work and autoimmune workup to be performed today.    Hyperbilirubinemia Repeat lab work obtained today.  Possible underlying autoimmune issues,   Orders Placed This Encounter  Procedures  . [place] MSK POCT ULTRASOUND  . CBC with Differential/Platelet  . Comprehensive metabolic panel  . C-reactive protein  . Rheumatoid factor  . Sedimentation rate  . Cyclic citrul peptide antibody, IgG  . ANA  . Uric acid  . ANCA Screen Reflex Titer  . HIV antibody (with reflex)    Meds ordered this encounter  Medications  . predniSONE (DELTASONE) 20 MG tablet    Sig: Take 3tabs PO QAM x3days, 2tabs PO QAM x3days, 1tab PO QAM x4days, 0.5tab PO QAM X4days    Dispense:  21 tablet    Refill:  0       Please see additional documentation for Objective, Assessment and Plan sections. Pertinent additional documentation may be included in corresponding procedure notes, imaging studies, problem based documentation and patient instructions. Please see these sections of the encounter for additional information regarding this visit.  CMA/ATC served as  scribe during this visit. History, Physical, and Plan performed by medical provider. Documentation and orders reviewed and attested to.      Gerda Diss, [facility] Physician

## 2017-07-30 NOTE — Assessment & Plan Note (Addendum)
Reflex labs to ANCA and HIV but most likely secondary to prolonged cephalosporin use.  If any lack of definitive cause bone marrow biopsy may be indicated but referral to hematology will need to be discussed.

## 2017-07-30 NOTE — Assessment & Plan Note (Signed)
This is been present for quite some time.  Given ongoing symptoms and involvement of the scalp, right hand and great toe further diagnostic evaluation with repeat lab work and autoimmune workup to be performed today.

## 2017-07-30 NOTE — Assessment & Plan Note (Signed)
Repeat lab work obtained today.  Possible underlying autoimmune issues,

## 2017-07-30 NOTE — Assessment & Plan Note (Signed)
Overall doing as expected.  Some persistent pain but overall good improvement.  She does have persistent neovascular changes as well as small amount of hypoechoic change persistently within the common extensor tendon but is 6 weeks out and tissue should be strong enough to resume activities as tolerated with continuing therapeutic exercises.  Continue with compression and icing.  Possible slow response due to underlying other medical issues as outlined above.

## 2017-07-30 NOTE — Procedures (Signed)
LIMITED MSK ULTRASOUND OF Right elbow Images were obtained and interpreted by myself, Gaspar BiddingMichael Brianah Hopson, DO  Images have been saved and stored to PACS system. Images obtained on: GE S7 Ultrasound machine  FINDINGS:   Persistent hypoechoic change within the common extensor tendon with increased neovascularity that is still penetrating into the tendon at this time consistent with appropriate healing.  IMPRESSION:  1. Healing lateral epicondylosis status post PRP injection 6 weeks ago

## 2017-08-04 LAB — ANA: ANA: NEGATIVE

## 2017-08-04 LAB — HIV ANTIBODY (ROUTINE TESTING W REFLEX): HIV 1&2 Ab, 4th Generation: NONREACTIVE

## 2017-08-04 LAB — RHEUMATOID FACTOR: Rhuematoid fact SerPl-aCnc: <14 IU/mL (ref <14)

## 2017-08-04 LAB — ANCA SCREEN W REFLEX TITER: ANCA SCREEN: NEGATIVE

## 2017-08-04 LAB — CYCLIC CITRUL PEPTIDE ANTIBODY, IGG: Cyclic Citrullin Peptide Ab: <16 UNITS

## 2017-08-10 ENCOUNTER — Encounter: Payer: Self-pay | Admitting: Sports Medicine

## 2017-08-10 DIAGNOSIS — N390 Urinary tract infection, site not specified: Secondary | ICD-10-CM | POA: Diagnosis not present

## 2017-08-11 ENCOUNTER — Ambulatory Visit: Payer: BLUE CROSS/BLUE SHIELD | Admitting: Sports Medicine

## 2017-08-11 ENCOUNTER — Encounter: Payer: Self-pay | Admitting: Sports Medicine

## 2017-08-11 VITALS — BP 112/78 | HR 96 | Ht 61.0 in | Wt 134.2 lb

## 2017-08-11 DIAGNOSIS — R590 Localized enlarged lymph nodes: Secondary | ICD-10-CM | POA: Diagnosis not present

## 2017-08-11 DIAGNOSIS — R740 Nonspecific elevation of levels of transaminase and lactic acid dehydrogenase [LDH]: Secondary | ICD-10-CM

## 2017-08-11 DIAGNOSIS — D6800 Von Willebrand disease, unspecified: Secondary | ICD-10-CM

## 2017-08-11 DIAGNOSIS — M7711 Lateral epicondylitis, right elbow: Secondary | ICD-10-CM | POA: Diagnosis not present

## 2017-08-11 DIAGNOSIS — R310 Gross hematuria: Secondary | ICD-10-CM

## 2017-08-11 DIAGNOSIS — H6011 Cellulitis of right external ear: Secondary | ICD-10-CM | POA: Diagnosis not present

## 2017-08-11 DIAGNOSIS — R7401 Elevation of levels of liver transaminase levels: Secondary | ICD-10-CM

## 2017-08-11 LAB — URINALYSIS, ROUTINE W REFLEX MICROSCOPIC
Bilirubin Urine: NEGATIVE
Hgb urine dipstick: NEGATIVE
Ketones, ur: NEGATIVE
Leukocytes, UA: NEGATIVE
PH: 8 (ref 5.0–8.0)
RBC / HPF: NONE SEEN (ref 0–?)
SPECIFIC GRAVITY, URINE: 1.015 (ref 1.000–1.030)
TOTAL PROTEIN, URINE-UPE24: NEGATIVE
URINE GLUCOSE: NEGATIVE
Urobilinogen, UA: 1 (ref 0.0–1.0)

## 2017-08-11 LAB — COMPREHENSIVE METABOLIC PANEL
ALBUMIN: 4.2 g/dL (ref 3.5–5.2)
ALK PHOS: 39 U/L (ref 39–117)
ALT: 22 U/L (ref 0–35)
AST: 17 U/L (ref 0–37)
BILIRUBIN TOTAL: 1.3 mg/dL — AB (ref 0.2–1.2)
BUN: 14 mg/dL (ref 6–23)
CALCIUM: 9.5 mg/dL (ref 8.4–10.5)
CO2: 29 mEq/L (ref 19–32)
CREATININE: 0.8 mg/dL (ref 0.40–1.20)
Chloride: 103 mEq/L (ref 96–112)
GFR: 84.68 mL/min (ref >60.00)
Glucose, Bld: 105 mg/dL — ABNORMAL HIGH (ref 70–99)
Potassium: 4.4 mEq/L (ref 3.5–5.1)
SODIUM: 139 meq/L (ref 135–145)
TOTAL PROTEIN: 7.6 g/dL (ref 6.0–8.3)

## 2017-08-11 LAB — CBC WITH DIFFERENTIAL/PLATELET
BASOS PCT: 0.3 % (ref 0.0–3.0)
Basophils Absolute: 0 10*3/uL (ref 0.0–0.1)
EOS ABS: 0 10*3/uL (ref 0.0–0.7)
Eosinophils Relative: 0.1 % (ref 0.0–5.0)
HEMATOCRIT: 41.7 % (ref 36.0–46.0)
HEMOGLOBIN: 14.1 g/dL (ref 12.0–15.0)
LYMPHS PCT: 14.8 % (ref 12.0–46.0)
Lymphs Abs: 0.9 10*3/uL (ref 0.7–4.0)
MCHC: 33.7 g/dL (ref 30.0–36.0)
MCV: 90.7 fl (ref 78.0–100.0)
MONO ABS: 0.2 10*3/uL (ref 0.1–1.0)
Monocytes Relative: 3.9 % (ref 3.0–12.0)
Neutro Abs: 5.1 10*3/uL (ref 1.4–7.7)
Neutrophils Relative %: 80.9 % — ABNORMAL HIGH (ref 43.0–77.0)
Platelets: 244 10*3/uL (ref 150.0–400.0)
RBC: 4.6 Mil/uL (ref 3.87–5.11)
RDW: 12.9 % (ref 11.5–15.5)
WBC: 6.3 10*3/uL (ref 4.0–10.5)

## 2017-08-11 LAB — SEDIMENTATION RATE: SED RATE: 12 mm/h (ref 0–20)

## 2017-08-11 NOTE — Progress Notes (Signed)
Veverly FellsMichael D. Delorise Shinerigby, DO  [facility] [place] [phone]  Cohen Rondel BatonS Mittleman - 42 y.o. female MRN [id number]  Date of birth: 09/23/1975  Visit Date: 08/11/2017  PCP: Etta GrandchildJones, Thomas L, MD   Referred by: Etta GrandchildJones, Thomas L, MD  Scribe for today's visit: Stevenson ClinchBrandy Coleman, CMA     SUBJECTIVE:  Danielle Bakemy [name] is here for Follow-up (toe pain)  07/01/17: Compared to the last office visit, her previously described symptoms show no change. Initially pain was worse but has gotten back to about how it felt prior to injection.  Current symptoms are moderate & are radiating to R proximal forearm.  She received PRP inj 06/17/17 and was prescribed Tramadol to take prn for the pain. She has been wearing sling when out in a crowded place or when she knows that she will be using her arm a lot.   07/16/17: Compared to the last office visit, her previously described symptoms show no change. Has pain with 2 lb weight exercises.  Current symptoms are moderate & are nonradiating. Pain seems to be worse with cold weather.  She has been wearing Body Helix compression sleeve. She was prescribed Tramadol but has not been taking it.   07/30/2017: Compared to the last office visit, her previously described symptoms are improving, still has some discomfort when lifting objects.  Current symptoms are mild & are radiating to forearm when lifting heavy objects.  She has been wearing Body Helix compression sleeve. She is not currently taking anything for pain.   Pt reports swollen lymph nodes behind neck and ears. Sores on scalp and gums, she was prescribed Ketoconazole shampoo and Clindamycin for her scalp and Fluocinonide for her gums/mouth. Culture was done of scalp at dermatology but she hasn't heard back regarding results yet Ocular rosacea, prescribe and completed Acuicyn Tobradex.   infection of toe and finger, she was prescribed Cephalexin.  She had a staph infection on her ear and  was advised to use Desonide.  This has all occurred since this past November 2018.   She has hx of migraines. No unusual rashes on her face. She denies diarrhea/constipation.  She is not fasting today.    08/11/2017 Compared to the last office visit, her previously described symptoms are worsening. She developed UTI (hematuria, dysuria, increased urgency/frequency) over the weekend and [place] [facility] [place] She has been experiencing HA and nausea since starting Cipro yesterday. She has only taken 3 pills.  Her toe pain is about the same. It has started itching and peeling. She has almost completed steroid. She reports no change with finger.   ROS Denies night time disturbances. Reports fevers, chills, or night sweats. Denies unexplained weight loss. Denies personal history of cancer. Reports changes in bowel or bladder habits.  Recent gross hematuria Denies recent unreported falls. Denies new or worsening dyspnea or wheezing. Reports headaches or dizziness.  Chronic migraine Denies numbness, tingling or weakness  In the extremities.  Denies dizziness or presyncopal episodes Denies lower extremity edema    HISTORY & PERTINENT PRIOR DATA:  Prior History reviewed and updated per electronic medical record.  Significant/pertinent history, findings, studies include:  reports that she has never smoked. She has never used smokeless tobacco. Recent Labs    07/30/17 1102  LABURIC 4.4   No specialty comments available. No problems updated.  OBJECTIVE:  VS:  HT:5\' 1"  (154.9 cm)   WT:134 lb 3.2 oz (60.9 kg)  BMI:25.37    BP:112/78  HR:96bpm  TEMP: ( )  RESP:94 %   PHYSICAL EXAM: Constitutional: WDWN, Non-toxic appearing. Psychiatric: Alert & appropriately interactive.  Not depressed or anxious appearing. Respiratory: No increased work of breathing.  Trachea Midline Eyes: Pupils are equal.  EOM intact without nystagmus.  No scleral icterus  Vascular  Exam: warm to touch no edema  upper and lower extremity neuro exam: unremarkable  MSK Exam: Persistent erythema of the great toe as well as fingers.  She has changes that are scaling across the scalp.  No scleral icterus.  Abdomen soft is nontender.   ASSESSMENT & PLAN:   1. Gross hematuria   2. Lateral epicondylitis of right elbow   3. Posterior auricular lymphadenopathy   4. Cellulitis of right ear   5. Von Willebrand disease (HCC)   6. Transaminitis     PLAN: Multifactorial case of unknown skin changes, underlying hematologic issues, recent urinary tract infection and skin infections with posterior auricular lymphadenitis that is waxing and waning.  She did respond well to the steroids while on it and the skin did clear however since discontinuing this has come back.  She has had a skin scraping of her scalp that was negative for fungus but otherwise unclear.  History of transaminitis that was unexplained in the past but has been intermittent and recurrent.  Will check lab work today and plan to call her with these results.  She may ultimately require referral to rheumatology versus consideration of biopsy of the scalp.    She is back on antibiotics due to urinary tract infection she had been awaiting these results to be sent to me.  We will go ahead and recheck her urine and also check her sediment to make sure there is no potential rheumatologic kidney involvement.   Follow-up: Return if symptoms worsen or fail to improve.       Please see additional documentation for Objective, Assessment and Plan sections. Pertinent additional documentation may be included in corresponding procedure notes, imaging studies, problem based documentation and patient instructions. Please see these sections of the encounter for additional information regarding this visit.  CMA/ATC served as Neurosurgeon during this visit. History, Physical, and Plan performed by medical provider. Documentation and orders  reviewed and attested to.      Andrena Mews, DO    [facility] Physician

## 2017-08-19 DIAGNOSIS — M79674 Pain in right toe(s): Secondary | ICD-10-CM | POA: Diagnosis not present

## 2017-08-21 ENCOUNTER — Other Ambulatory Visit: Payer: Self-pay

## 2017-08-21 ENCOUNTER — Telehealth: Payer: Self-pay | Admitting: Internal Medicine

## 2017-08-21 ENCOUNTER — Encounter: Payer: Self-pay | Admitting: *Deleted

## 2017-08-21 DIAGNOSIS — R239 Unspecified skin changes: Secondary | ICD-10-CM

## 2017-08-21 DIAGNOSIS — R5383 Other fatigue: Secondary | ICD-10-CM

## 2017-08-21 DIAGNOSIS — D708 Other neutropenia: Secondary | ICD-10-CM

## 2017-08-21 NOTE — Telephone Encounter (Signed)
This encounter was created in error - please disregard.

## 2017-08-21 NOTE — Telephone Encounter (Signed)
See note

## 2017-08-21 NOTE — Telephone Encounter (Signed)
Pt. Called to ask Dr. Berline Choughigby "where do we go from here?" States the areas on her scalp went away with the Prednisone. Lymph nodes "are a little better." Still fatigued, still has issue with "my finger and my toe." Wants to know "what is the plan to finding out what is wrong with me." Please advise pt.

## 2017-08-21 NOTE — Telephone Encounter (Signed)
Spoke with pt and advised that we will be referring her to rheumatology. Referrals has been placed.

## 2017-09-02 ENCOUNTER — Telehealth: Payer: Self-pay

## 2017-09-02 ENCOUNTER — Telehealth: Payer: Self-pay | Admitting: Internal Medicine

## 2017-09-02 DIAGNOSIS — L309 Dermatitis, unspecified: Secondary | ICD-10-CM | POA: Diagnosis not present

## 2017-09-02 NOTE — Telephone Encounter (Unsigned)
Copied from CRM #[id number]. Topic: Quick Communication - See Telephone Encounter >> Sep 02, 2017 12:54 PM Stovall, Shana A wrote: CRM for notification. See Telephone encounter for: 09/02/17. Pt called back in, she was just returning brandys Call  

## 2017-09-02 NOTE — Telephone Encounter (Signed)
Spoke with pt, she will have her dermatologist fax recent OV notes, results of culture, and results of biopsy to [facility] at [phone]. No further action needed.

## 2017-09-02 NOTE — Telephone Encounter (Signed)
[facility] requesting notes from dermatology and results of biopsy.   Called pt, VM full.

## 2017-09-02 NOTE — Telephone Encounter (Signed)
Copied from CRM [id number]. Topic: Quick Communication - See Telephone Encounter >> Sep 02, 2017 12:54 PM Floria Raveling A wrote: CRM for notification. See Telephone encounter for: 09/02/17. Pt called back in, she was just returning brandys Call

## 2017-09-11 ENCOUNTER — Encounter: Payer: Self-pay | Admitting: Sports Medicine

## 2017-09-12 ENCOUNTER — Encounter: Payer: Self-pay | Admitting: Sports Medicine

## 2017-09-12 MED ORDER — PREDNISONE 20 MG PO TABS: ORAL_TABLET | ORAL | 0 refills | Status: DC

## 2017-09-12 NOTE — Progress Notes (Signed)
PT called reporting persistent worsening lymphadenopathy and worsening skin symptoms. She had a biopsy of her scalp last week and is still awaiting results. Lower dose steroid Rx given worsening symptoms. Has upcoming rheum appointment already scheduled

## 2017-09-12 NOTE — Progress Notes (Signed)
Rx called in 

## 2017-09-15 ENCOUNTER — Encounter: Payer: Self-pay | Admitting: Sports Medicine

## 2017-09-16 DIAGNOSIS — Z4802 Encounter for removal of sutures: Secondary | ICD-10-CM | POA: Diagnosis not present

## 2017-09-16 DIAGNOSIS — L930 Discoid lupus erythematosus: Secondary | ICD-10-CM | POA: Diagnosis not present

## 2017-09-22 DIAGNOSIS — Z79899 Other long term (current) drug therapy: Secondary | ICD-10-CM | POA: Diagnosis not present

## 2017-10-14 ENCOUNTER — Encounter: Payer: Self-pay | Admitting: Sports Medicine

## 2017-10-14 DIAGNOSIS — L930 Discoid lupus erythematosus: Secondary | ICD-10-CM | POA: Diagnosis not present

## 2017-10-14 DIAGNOSIS — M2550 Pain in unspecified joint: Secondary | ICD-10-CM | POA: Diagnosis not present

## 2017-10-14 DIAGNOSIS — D8989 Other specified disorders involving the immune mechanism, not elsewhere classified: Secondary | ICD-10-CM | POA: Diagnosis not present

## 2017-10-23 DIAGNOSIS — L930 Discoid lupus erythematosus: Secondary | ICD-10-CM | POA: Diagnosis not present

## 2017-10-28 DIAGNOSIS — M2550 Pain in unspecified joint: Secondary | ICD-10-CM | POA: Diagnosis not present

## 2017-10-28 DIAGNOSIS — L930 Discoid lupus erythematosus: Secondary | ICD-10-CM | POA: Diagnosis not present

## 2017-10-28 DIAGNOSIS — D8989 Other specified disorders involving the immune mechanism, not elsewhere classified: Secondary | ICD-10-CM | POA: Diagnosis not present

## 2017-11-05 DIAGNOSIS — R591 Generalized enlarged lymph nodes: Secondary | ICD-10-CM | POA: Diagnosis not present

## 2017-11-13 DIAGNOSIS — S62304A Unspecified fracture of fourth metacarpal bone, right hand, initial encounter for closed fracture: Secondary | ICD-10-CM | POA: Diagnosis not present

## 2017-11-14 DIAGNOSIS — S62629A Displaced fracture of medial phalanx of unspecified finger, initial encounter for closed fracture: Secondary | ICD-10-CM | POA: Insufficient documentation

## 2017-11-14 DIAGNOSIS — S62624A Displaced fracture of medial phalanx of right ring finger, initial encounter for closed fracture: Secondary | ICD-10-CM | POA: Diagnosis not present

## 2017-11-14 DIAGNOSIS — M79641 Pain in right hand: Secondary | ICD-10-CM | POA: Diagnosis not present

## 2017-11-21 DIAGNOSIS — S62654D Nondisplaced fracture of medial phalanx of right ring finger, subsequent encounter for fracture with routine healing: Secondary | ICD-10-CM | POA: Diagnosis not present

## 2017-12-03 DIAGNOSIS — D8989 Other specified disorders involving the immune mechanism, not elsewhere classified: Secondary | ICD-10-CM | POA: Diagnosis not present

## 2017-12-03 DIAGNOSIS — L930 Discoid lupus erythematosus: Secondary | ICD-10-CM | POA: Diagnosis not present

## 2017-12-03 DIAGNOSIS — M2550 Pain in unspecified joint: Secondary | ICD-10-CM | POA: Diagnosis not present

## 2017-12-04 DIAGNOSIS — S62624A Displaced fracture of medial phalanx of right ring finger, initial encounter for closed fracture: Secondary | ICD-10-CM | POA: Diagnosis not present

## 2017-12-29 DIAGNOSIS — S62624D Displaced fracture of medial phalanx of right ring finger, subsequent encounter for fracture with routine healing: Secondary | ICD-10-CM | POA: Diagnosis not present

## 2017-12-31 DIAGNOSIS — Z01419 Encounter for gynecological examination (general) (routine) without abnormal findings: Secondary | ICD-10-CM | POA: Diagnosis not present

## 2017-12-31 DIAGNOSIS — Z6825 Body mass index (BMI) 25.0-25.9, adult: Secondary | ICD-10-CM | POA: Diagnosis not present

## 2018-01-21 NOTE — Telephone Encounter (Signed)
This encounter was created in error - please disregard.

## 2018-01-22 DIAGNOSIS — G43719 Chronic migraine without aura, intractable, without status migrainosus: Secondary | ICD-10-CM | POA: Diagnosis not present

## 2018-01-22 DIAGNOSIS — G43019 Migraine without aura, intractable, without status migrainosus: Secondary | ICD-10-CM | POA: Diagnosis not present

## 2018-02-10 DIAGNOSIS — D8989 Other specified disorders involving the immune mechanism, not elsewhere classified: Secondary | ICD-10-CM | POA: Diagnosis not present

## 2018-02-10 DIAGNOSIS — Z79899 Other long term (current) drug therapy: Secondary | ICD-10-CM | POA: Diagnosis not present

## 2018-02-10 DIAGNOSIS — M2550 Pain in unspecified joint: Secondary | ICD-10-CM | POA: Diagnosis not present

## 2018-02-10 DIAGNOSIS — L930 Discoid lupus erythematosus: Secondary | ICD-10-CM | POA: Diagnosis not present

## 2018-05-19 ENCOUNTER — Other Ambulatory Visit: Payer: BLUE CROSS/BLUE SHIELD

## 2018-05-19 ENCOUNTER — Ambulatory Visit: Payer: BLUE CROSS/BLUE SHIELD | Admitting: Internal Medicine

## 2018-05-19 ENCOUNTER — Encounter: Payer: Self-pay | Admitting: Internal Medicine

## 2018-05-19 VITALS — BP 116/74 | HR 80 | Temp 99.0°F | Resp 16 | Ht 61.0 in | Wt 138.2 lb

## 2018-05-19 DIAGNOSIS — L02412 Cutaneous abscess of left axilla: Secondary | ICD-10-CM

## 2018-05-19 DIAGNOSIS — L930 Discoid lupus erythematosus: Secondary | ICD-10-CM

## 2018-05-19 DIAGNOSIS — Z23 Encounter for immunization: Secondary | ICD-10-CM | POA: Diagnosis not present

## 2018-05-19 DIAGNOSIS — L720 Epidermal cyst: Secondary | ICD-10-CM | POA: Diagnosis not present

## 2018-05-19 DIAGNOSIS — R229 Localized swelling, mass and lump, unspecified: Secondary | ICD-10-CM

## 2018-05-19 MED ORDER — DOXYCYCLINE HYCLATE 100 MG PO CAPS: 100.0000 mg | ORAL_CAPSULE | Freq: Two times a day (BID) | ORAL | 0 refills | Status: DC

## 2018-05-19 MED ORDER — DOXYCYCLINE HYCLATE 100 MG PO TBEC: 100.0000 mg | DELAYED_RELEASE_TABLET | Freq: Two times a day (BID) | ORAL | 0 refills | Status: DC

## 2018-05-19 NOTE — Progress Notes (Signed)
Subjective:  Patient ID: [name], female    DOB: 1976-03-03  Age: 43 y.o. MRN: [id number]  CC: Recurrent Skin Infections   HPI [name] presents for concerns about a 3 week hx of painful, swollen lesion in left armpit for 3 weeks.  Outpatient Medications Prior to Visit  Medication Sig Dispense Refill  . CRYSELLE-28 0.3-30 MG-MCG tablet Take 1 tablet by mouth daily.  0  . eletriptan (RELPAX) 40 MG tablet Take 40 mg by mouth as needed for migraine. One tablet by mouth at onset of headache. May repeat in 2 hours if headache persists or recurs.    Marland Kitchen EPINEPHrine (EPI-PEN) 0.3 mg/0.3 mL DEVI Inject 0.3 mg into the muscle as needed.    . hydroxychloroquine (PLAQUENIL) 200 MG tablet hydroxychloroquine 200 mg tablet    . imipramine (TOFRANIL) 25 MG tablet Take 25 mg by mouth daily.  5  . ketoconazole (NIZORAL) 2 % shampoo Apply 1 application topically 2 (two) times a week.    . phenazopyridine (PYRIDIUM) 200 MG tablet Take 200 mg by mouth 3 (three) times daily as needed for pain.    . Probiotic Product (DIGESTIVE ADVANTAGE PO) Take by mouth.    . ciprofloxacin (CIPRO) 500 MG tablet   0  . predniSONE (DELTASONE) 20 MG tablet Take 2tabs PO QAM x3 days then 1 tab po q day until directed by physician to stop 30 tablet 0   No facility-administered medications prior to visit.     ROS Review of Systems  Constitutional: Positive for fever. Negative for chills and fatigue.  HENT: Negative.   Respiratory: Negative for cough, shortness of breath and wheezing.   Cardiovascular: Negative for chest pain, palpitations and leg swelling.  Gastrointestinal: Negative for abdominal pain, diarrhea, nausea and vomiting.  Genitourinary: Negative.  Negative for difficulty urinating.  Musculoskeletal: Negative.   Skin: Negative.  Negative for color change and pallor.  Neurological: Negative.  Negative for dizziness.  Hematological: Negative for adenopathy. Does not bruise/bleed easily.    Psychiatric/Behavioral: Negative.     Objective:  BP 116/74 (BP Location: Left Arm, Patient Position: Sitting, Cuff Size: Normal)   Pulse 80   Temp 99 F (37.2 C) (Oral)   Resp 16   Ht 5\' 1"  (1.549 m)   Wt 138 lb 4 oz (62.7 kg)   LMP  (LMP Unknown)   SpO2 98%   BMI 26.12 kg/m   BP Readings from Last 3 Encounters:  05/19/18 116/74  08/11/17 112/78  07/30/17 110/80    Wt Readings from Last 3 Encounters:  05/19/18 138 lb 4 oz (62.7 kg)  08/11/17 134 lb 3.2 oz (60.9 kg)  07/30/17 132 lb 9.6 oz (60.1 kg)    Physical Exam Vitals signs reviewed.  Constitutional:      General: She is not in acute distress.    Appearance: She is not ill-appearing, toxic-appearing or diaphoretic.  HENT:     Nose: Nose normal. No congestion.     Mouth/Throat:     Pharynx: Oropharynx is clear. No oropharyngeal exudate or posterior oropharyngeal erythema.  Eyes:     General: No scleral icterus.    Conjunctiva/sclera: Conjunctivae normal.  Neck:     Musculoskeletal: Normal range of motion and neck supple. No muscular tenderness.  Cardiovascular:     Rate and Rhythm: Normal rate and regular rhythm.     Heart sounds: No murmur. No gallop.   Pulmonary:     Breath sounds: No wheezing, rhonchi or rales.  Abdominal:     General: Bowel sounds are normal.     Palpations: There is no mass.     Tenderness: There is no abdominal tenderness. There is no guarding.     Hernia: No hernia is present.  Musculoskeletal: Normal range of motion.        General: No swelling.     Right lower leg: No edema.     Left lower leg: No edema.     Comments: Left axilla was cleaned with Betadine and the area prepped and draped in sterile fashion.  Local anesthesia was obtained with instillation of 1% lidocaine with epi.  About 2 cc were used.  4 mm punch incision was made.  The contents of a fleshy cystic structure was identified, a specimen collected, and sent for pathology.  There was no exudate.  The wound was closed  with 1 simple interrupted suture using 4-0 nylon on a PC 3.  The closure affect is good.  Hemostasis was maintained throughout.  Lymphadenopathy:     Head:     Right side of head: No submental, submandibular, tonsillar, preauricular, posterior auricular or occipital adenopathy.     Left side of head: No submental, submandibular, tonsillar, preauricular, posterior auricular or occipital adenopathy.     Cervical: No cervical adenopathy.     Right cervical: No superficial, deep or posterior cervical adenopathy.    Left cervical: No superficial, deep or posterior cervical adenopathy.     Upper Body:     Right upper body: No supraclavicular, axillary, pectoral or epitrochlear adenopathy.     Left upper body: No supraclavicular, axillary, pectoral or epitrochlear adenopathy.     Lower Body: No right inguinal adenopathy. No left inguinal adenopathy.  Skin:    General: Skin is dry.     Findings: Erythema present.     Comments: In the left axilla there is a subcutaneous, 1.5 cm, round. slightly tender, freely mobile, firm and nonfluctuant mass.  Overlying skin is mildly erythematous.     Lab Results  Component Value Date   WBC 6.3 08/11/2017   HGB 14.1 08/11/2017   HCT 41.7 08/11/2017   PLT 244.0 08/11/2017   GLUCOSE 105 (H) 08/11/2017   CHOL 144 04/07/2017   TRIG 97.0 04/07/2017   HDL 45.40 04/07/2017   LDLCALC 79 04/07/2017   ALT 22 08/11/2017   AST 17 08/11/2017   NA 139 08/11/2017   K 4.4 08/11/2017   CL 103 08/11/2017   CREATININE 0.80 08/11/2017   BUN 14 08/11/2017   CO2 29 08/11/2017   TSH 1.82 04/07/2017   INR 1.23 05/25/2009    Dg Chest 2 View  Result Date: 06/06/2016 CLINICAL DATA:  Patient with mid chest pain, cough and fever. EXAM: CHEST  2 VIEW COMPARISON:  Chest radiograph 03/14/2011 FINDINGS: Normal cardiac and mediastinal contours. No consolidative pulmonary opacities. No pleural effusion or pneumothorax. Regional skeleton is unremarkable. Probable nipple shadows  projecting over the lower thorax bilaterally. IMPRESSION: No acute cardiopulmonary process. Electronically Signed   By: Annia Belt M.D.   On: 06/06/2016 11:06    Assessment & Plan:   [name] was seen today for recurrent skin infections.  Diagnoses and all orders for this visit:  Abscess of left axilla- There was no exudate or abscess identified therefore I did not pack it with iodoform.  I think this may be an infected sebaceous gland or cyst so I have asked her to take a course of doxycycline.  I await the results  of a culture. -     WOUND CULTURE; Future -     Discontinue: doxycycline (DORYX) 100 MG EC tablet; Take 1 tablet (100 mg total) by mouth 2 (two) times daily for 7 days. -     Dermatology pathology; Future  Discoid lupus   Mass of skin- Biopsy specimen sent to identify if this is a malignant lesion or or something in the cystic realm. -     Dermatology pathology; Future  Need for Tdap vaccination -     Tdap vaccine greater than or equal to 7yo IM  Other orders -     doxycycline (VIBRAMYCIN) 100 MG capsule; Take 1 capsule (100 mg total) by mouth 2 (two) times daily.   I have discontinued Adra S. Bolding's ciprofloxacin, predniSONE, and doxycycline. I am also having her start on doxycycline. Additionally, I am having her maintain her EPINEPHrine, eletriptan, CRYSELLE-28, imipramine, ketoconazole, Probiotic Product (DIGESTIVE ADVANTAGE PO), phenazopyridine, and hydroxychloroquine.  Meds ordered this encounter  Medications  . DISCONTD: doxycycline (DORYX) 100 MG EC tablet    Sig: Take 1 tablet (100 mg total) by mouth 2 (two) times daily for 7 days.    Dispense:  14 tablet    Refill:  0  . doxycycline (VIBRAMYCIN) 100 MG capsule    Sig: Take 1 capsule (100 mg total) by mouth 2 (two) times daily.    Dispense:  14 capsule    Refill:  0    Dc last rx for doxy.     Follow-up: Return in about 1 week (around 05/26/2018).  Sanda Lingerhomas Jaiona Simien, MD

## 2018-05-19 NOTE — Patient Instructions (Signed)
Incision and Drainage    Incision and drainage is a surgical procedure to open and drain a fluid-filled sac. The sac may be filled with pus, mucus, or blood. Examples of fluid-filled sacs that may need surgical drainage include cysts, skin infections (abscesses), and red lumps that develop from a ruptured cyst or a small abscess (boils).  You may need this procedure if the affected area is large, painful, infected, or not healing well.  Tell a health care provider about:   Any allergies you have.   All medicines you are taking, including vitamins, herbs, eye drops, creams, and over-the-counter medicines.   Any problems you or family members have had with anesthetic medicines.   Any blood disorders you have.   Any surgeries you have had.   Any medical conditions you have.   Whether you are pregnant or may be pregnant.  What are the risks?  Generally, this is a safe procedure. However, problems may occur, including:   Infection.   Bleeding.   Allergic reactions to medicines.   Scarring.  What happens before the procedure?   You may need an ultrasound or other imaging tests to see how large or deep the fluid-filled sac is.   You may have blood tests to check for infection.   You may get a tetanus shot.   You may be given antibiotic medicine to help prevent infection.   Follow instructions from your health care provider about eating or drinking restrictions.   Ask your health care provider about:  ? Changing or stopping your regular medicines. This is especially important if you are taking diabetes medicines or blood thinners.  ? Taking medicines such as aspirin and ibuprofen. These medicines can thin your blood. Do not take these medicines before your procedure if your health care provider instructs you not to.   Plan to have someone take you home after the procedure.   If you will be going home right after the procedure, plan to have someone stay with you for 24 hours.  What happens during the  procedure?   To reduce your risk of infection:  ? Your health care team will wash or sanitize their hands.  ? Your skin will be washed with soap.   You will be given one or more of the following:  ? A medicine to help you relax (sedative).  ? A medicine to numb the area (local anesthetic).  ? A medicine to make you fall asleep (general anesthetic).   An incision will be made in the top of the fluid-filled sac.   The contents of the sac may be squeezed out, or a syringe or tube (catheter)may be used to empty the sac.   The catheter may be left in place for several weeks to drain any fluid. Or, your health care provider may stitch open the edges of the incision to make a long-term opening for drainage (marsupialization).   The inside of the sac may be washed out (irrigated) with a sterile solution and packed with gauze before it is covered with a bandage (dressing).  The procedure may vary among health care providers and hospitals.  What happens after the procedure?   Your blood pressure, heart rate, breathing rate, and blood oxygen level will be monitored often until the medicines you were given have worn off.   Do not drive for 24 hours if you received a sedative.  This information is not intended to replace advice given to you by your health care   provider. Make sure you discuss any questions you have with your health care provider.  Document Released: 10/16/2000 Document Revised: 09/28/2015 Document Reviewed: 02/10/2015  Elsevier Interactive Patient Education  2019 [employer]

## 2018-05-21 ENCOUNTER — Encounter: Payer: Self-pay | Admitting: Internal Medicine

## 2018-05-22 LAB — WOUND CULTURE
MICRO NUMBER:: 52831
SPECIMEN QUALITY:: ADEQUATE

## 2018-05-26 ENCOUNTER — Ambulatory Visit: Payer: BLUE CROSS/BLUE SHIELD | Admitting: Internal Medicine

## 2018-05-26 ENCOUNTER — Encounter: Payer: Self-pay | Admitting: Internal Medicine

## 2018-05-26 VITALS — BP 120/80 | HR 71 | Temp 97.8°F | Ht 61.0 in | Wt 137.0 lb

## 2018-05-26 DIAGNOSIS — L720 Epidermal cyst: Secondary | ICD-10-CM | POA: Diagnosis not present

## 2018-05-26 NOTE — Patient Instructions (Signed)
Wound Care, Adult  Taking care of your wound properly can help to prevent pain, infection, and scarring. It can also help your wound to heal more quickly.  How to care for your wound  Wound care          Follow instructions from your health care provider about how to take care of your wound. Make sure you:  ? Wash your hands with soap and water before you change the bandage (dressing). If soap and water are not available, use hand sanitizer.  ? Change your dressing as told by your health care provider.  ? Leave stitches (sutures), skin glue, or adhesive strips in place. These skin closures may need to stay in place for 2 weeks or longer. If adhesive strip edges start to loosen and curl up, you may trim the loose edges. Do not remove adhesive strips completely unless your health care provider tells you to do that.   Check your wound area every day for signs of infection. Check for:  ? Redness, swelling, or pain.  ? Fluid or blood.  ? Warmth.  ? Pus or a bad smell.   Ask your health care provider if you should clean the wound with mild soap and water. Doing this may include:  ? Using a clean towel to pat the wound dry after cleaning it. Do not rub or scrub the wound.  ? Applying a cream or ointment. Do this only as told by your health care provider.  ? Covering the incision with a clean dressing.   Ask your health care provider when you can leave the wound uncovered.   Keep the dressing dry until your health care provider says it can be removed. Do not take baths, swim, use a hot tub, or do anything that would put the wound underwater until your health care provider approves. Ask your health care provider if you can take showers. You may only be allowed to take sponge baths.  Medicines     If you were prescribed an antibiotic medicine, cream, or ointment, take or use the antibiotic as told by your health care provider. Do not stop taking or using the antibiotic even if your condition improves.   Take  over-the-counter and prescription medicines only as told by your health care provider. If you were prescribed pain medicine, take it 30 or more minutes before you do any wound care or as told by your health care provider.  General instructions   Return to your normal activities as told by your health care provider. Ask your health care provider what activities are safe.   Do not scratch or pick at the wound.   Do not use any products that contain nicotine or tobacco, such as cigarettes and e-cigarettes. These may delay wound healing. If you need help quitting, ask your health care provider.   Keep all follow-up visits as told by your health care provider. This is important.   Eat a diet that includes protein, vitamin A, vitamin C, and other nutrient-rich foods to help the wound heal.  ? Foods rich in protein include meat, dairy, beans, nuts, and other sources.  ? Foods rich in vitamin A include carrots and dark green, leafy vegetables.  ? Foods rich in vitamin C include citrus, tomatoes, and other fruits and vegetables.  ? Nutrient-rich foods have protein, carbohydrates, fat, vitamins, or minerals. Eat a variety of healthy foods including vegetables, fruits, and whole grains.  Contact a health care provider if:     You received a tetanus shot and you have swelling, severe pain, redness, or bleeding at the injection site.   Your pain is not controlled with medicine.   You have redness, swelling, or pain around the wound.   You have fluid or blood coming from the wound.   Your wound feels warm to the touch.   You have pus or a bad smell coming from the wound.   You have a fever or chills.   You are nauseous or you vomit.   You are dizzy.  Get help right away if:   You have a red streak going away from your wound.   The edges of the wound open up and separate.   Your wound is bleeding, and the bleeding does not stop with gentle pressure.   You have a rash.   You faint.   You have trouble  breathing.  Summary   Always wash your hands with soap and water before changing your bandage (dressing).   To help with healing, eat foods that are rich in protein, vitamin A, vitamin C, and other nutrients.   Check your wound every day for signs of infection. Contact your health care provider if you suspect that your wound is infected.  This information is not intended to replace advice given to you by your health care provider. Make sure you discuss any questions you have with your health care provider.  Document Released: 01/30/2008 Document Revised: 06/03/2017 Document Reviewed: 11/07/2015  Elsevier Interactive Patient Education  2019 [employer]

## 2018-05-26 NOTE — Progress Notes (Signed)
Subjective:  Patient ID: [name], female    DOB: June 09, 1975  Age: 43 y.o. MRN: [id number]  CC: Wound Check   HPI [name] presents for f/up - The area in her left axilla has healed nicely with no complications.  The culture was negative and the biopsy specimen revealed an inflamed epidermoid cyst.  The area has healed nicely with no pain, redness, swelling, or complications.  Outpatient Medications Prior to Visit  Medication Sig Dispense Refill  . CRYSELLE-28 0.3-30 MG-MCG tablet Take 1 tablet by mouth daily.  0  . doxycycline (VIBRAMYCIN) 100 MG capsule Take 1 capsule (100 mg total) by mouth 2 (two) times daily. 14 capsule 0  . eletriptan (RELPAX) 40 MG tablet Take 40 mg by mouth as needed for migraine. One tablet by mouth at onset of headache. May repeat in 2 hours if headache persists or recurs.    Marland Kitchen EPINEPHrine (EPI-PEN) 0.3 mg/0.3 mL DEVI Inject 0.3 mg into the muscle as needed.    . hydroxychloroquine (PLAQUENIL) 200 MG tablet hydroxychloroquine 200 mg tablet    . imipramine (TOFRANIL) 25 MG tablet Take 25 mg by mouth daily.  5  . ketoconazole (NIZORAL) 2 % shampoo Apply 1 application topically 2 (two) times a week.    . phenazopyridine (PYRIDIUM) 200 MG tablet Take 200 mg by mouth 3 (three) times daily as needed for pain.    . Probiotic Product (DIGESTIVE ADVANTAGE PO) Take by mouth.     No facility-administered medications prior to visit.     ROS Review of Systems  Constitutional: Negative for chills and fever.  HENT: Negative.   Eyes: Negative for visual disturbance.  Respiratory: Negative.  Negative for cough, chest tightness, shortness of breath and wheezing.   Cardiovascular: Negative for chest pain.  Gastrointestinal: Negative for abdominal pain.  Endocrine: Negative.   Genitourinary: Negative.   Musculoskeletal: Negative.  Negative for arthralgias.  Skin: Positive for wound.  Neurological: Negative.  Negative for dizziness.  Hematological: Negative for  adenopathy. Does not bruise/bleed easily.  Psychiatric/Behavioral: Negative.     Objective:  BP 120/80 (BP Location: Left Arm, Patient Position: Sitting, Cuff Size: Normal)   Pulse 71   Temp 97.8 F (36.6 C) (Oral)   Ht 5\' 1"  (1.549 m)   Wt 137 lb (62.1 kg)   LMP  (LMP Unknown)   SpO2 99%   BMI 25.89 kg/m   BP Readings from Last 3 Encounters:  05/26/18 120/80  05/19/18 116/74  08/11/17 112/78    Wt Readings from Last 3 Encounters:  05/26/18 137 lb (62.1 kg)  05/19/18 138 lb 4 oz (62.7 kg)  08/11/17 134 lb 3.2 oz (60.9 kg)    Suture removed from the left axilla.  The site is healed nicely with a small amount of scar tissue but there is no erythema, tenderness, induration, fluctuance, or exudate.  Lab Results  Component Value Date   WBC 6.3 08/11/2017   HGB 14.1 08/11/2017   HCT 41.7 08/11/2017   PLT 244.0 08/11/2017   GLUCOSE 105 (H) 08/11/2017   CHOL 144 04/07/2017   TRIG 97.0 04/07/2017   HDL 45.40 04/07/2017   LDLCALC 79 04/07/2017   ALT 22 08/11/2017   AST 17 08/11/2017   NA 139 08/11/2017   K 4.4 08/11/2017   CL 103 08/11/2017   CREATININE 0.80 08/11/2017   BUN 14 08/11/2017   CO2 29 08/11/2017   TSH 1.82 04/07/2017   INR 1.23 05/25/2009    Dg  Chest 2 View  Result Date: 06/06/2016 CLINICAL DATA:  Patient with mid chest pain, cough and fever. EXAM: CHEST  2 VIEW COMPARISON:  Chest radiograph 03/14/2011 FINDINGS: Normal cardiac and mediastinal contours. No consolidative pulmonary opacities. No pleural effusion or pneumothorax. Regional skeleton is unremarkable. Probable nipple shadows projecting over the lower thorax bilaterally. IMPRESSION: No acute cardiopulmonary process. Electronically Signed   By: Annia Belt M.D.   On: 06/06/2016 11:06    Assessment & Plan:   [name] was seen today for wound check.  Diagnoses and all orders for this visit:  Epidermoid cyst of skin- The area has healed nicely.  She was offered reassurance about the benign nature with  respect to this.  She will let me know if she develops any new or worsening symptoms.   I am having [name] maintain her EPINEPHrine, eletriptan, CRYSELLE-28, imipramine, ketoconazole, Probiotic Product (DIGESTIVE ADVANTAGE PO), phenazopyridine, hydroxychloroquine, and doxycycline.  No orders of the defined types were placed in this encounter.    Follow-up: Return if symptoms worsen or fail to improve.  Sanda Linger, MD

## 2018-05-29 ENCOUNTER — Encounter: Payer: Self-pay | Admitting: Internal Medicine

## 2018-06-12 DIAGNOSIS — D8989 Other specified disorders involving the immune mechanism, not elsewhere classified: Secondary | ICD-10-CM | POA: Diagnosis not present

## 2018-06-12 DIAGNOSIS — M2550 Pain in unspecified joint: Secondary | ICD-10-CM | POA: Diagnosis not present

## 2018-06-12 DIAGNOSIS — L930 Discoid lupus erythematosus: Secondary | ICD-10-CM | POA: Diagnosis not present

## 2018-06-25 ENCOUNTER — Encounter: Payer: Self-pay | Admitting: Family Medicine

## 2018-06-25 ENCOUNTER — Ambulatory Visit (INDEPENDENT_AMBULATORY_CARE_PROVIDER_SITE_OTHER)
Admission: RE | Admit: 2018-06-25 | Discharge: 2018-06-25 | Disposition: A | Discharge status: Home / Self Care | Payer: BLUE CROSS/BLUE SHIELD | Source: Ambulatory Visit | Attending: Family Medicine | Admitting: Family Medicine

## 2018-06-25 ENCOUNTER — Ambulatory Visit: Payer: BLUE CROSS/BLUE SHIELD | Admitting: Family Medicine

## 2018-06-25 VITALS — BP 112/76 | HR 99 | Temp 98.4°F | Ht 61.0 in | Wt 133.0 lb

## 2018-06-25 DIAGNOSIS — R05 Cough: Secondary | ICD-10-CM | POA: Diagnosis not present

## 2018-06-25 DIAGNOSIS — R059 Cough, unspecified: Secondary | ICD-10-CM

## 2018-06-25 DIAGNOSIS — R6889 Other general symptoms and signs: Secondary | ICD-10-CM | POA: Diagnosis not present

## 2018-06-25 DIAGNOSIS — R0602 Shortness of breath: Secondary | ICD-10-CM | POA: Diagnosis not present

## 2018-06-25 DIAGNOSIS — J029 Acute pharyngitis, unspecified: Secondary | ICD-10-CM

## 2018-06-25 DIAGNOSIS — R079 Chest pain, unspecified: Secondary | ICD-10-CM | POA: Diagnosis not present

## 2018-06-25 LAB — POC INFLUENZA A&B (BINAX/QUICKVUE)
Influenza A, POC: NEGATIVE
Influenza B, POC: NEGATIVE

## 2018-06-25 LAB — POCT RAPID STREP A (OFFICE): RAPID STREP A SCREEN: NEGATIVE

## 2018-06-25 MED ORDER — OSELTAMIVIR PHOSPHATE 75 MG PO CAPS: 75.0000 mg | ORAL_CAPSULE | Freq: Two times a day (BID) | ORAL | 0 refills | Status: DC

## 2018-06-25 MED ORDER — ONDANSETRON HCL 4 MG PO TABS: 4.0000 mg | ORAL_TABLET | Freq: Three times a day (TID) | ORAL | 0 refills | Status: DC | PRN

## 2018-06-25 MED ORDER — BENZONATATE 100 MG PO CAPS: 100.0000 mg | ORAL_CAPSULE | Freq: Three times a day (TID) | ORAL | 0 refills | Status: DC

## 2018-06-25 NOTE — Progress Notes (Signed)
Patient ID: [name], female   DOB: 1975/06/29, 43 y.o.   MRN: [id number]  PCP: Etta Grandchild, MD  Subjective:  [name] is a 43 y.o. year old very pleasant female patient who presents with flu like symptoms including fever,body aches.  -other symptoms: headaches, nausea and vomiting, sinus pressure, mild discomfort when coughing present, Denies chest pain, diaphoresis, SOB -started: one day ago  -inside 48 hour treatment window if needed for tamiflu: Yes -high risk condition (children <5, adults >65, chronic pulmonary or cardiac condition, immunosuppression, pregnancy, nursing home resident, morbid obesity) : Under rheumatology for evaluation of autoimmune symtpoms -symptoms are not improving -previous treatments: Tylenol has provided limited benefit - patient did receive flu shot this year.  -  sick contact; specifically influenza: volunteers at school around sick contacts. She reports history of flu like symptoms in December and was provided Tamiflu. She was not tested for influenza at that time.  She has been able to eat oatmeal and water this morning without vomiting  ROS-denies SOB, diarrhea, sinus or dental pain  Pertinent Past Medical History-  Patient Active Problem List   Diagnosis Date Noted  . 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 Epidermoid cyst of skin 05/19/2018  . Hyperbilirubinemia 07/30/2017  . Routine general medical examination at a health care facility 04/07/2017  . Ulnar neuritis, right 11/23/2016  . Migraine 09/25/2016  . Von Willebrand disease (HCC) 11/03/2010  . Atrial fibrillation (HCC) 06/15/2008    Medications- reviewed  Current Outpatient Medications  Medication Sig Dispense Refill  . CRYSELLE-28 0.3-30 MG-MCG tablet Take 1 tablet by mouth daily.  0  . diphenhydrAMINE (BENADRYL) 25 MG tablet Take 25 mg by mouth every 6 (six) hours as needed.    . eletriptan (RELPAX) 40 MG tablet Take 40 mg by mouth as needed for migraine. One tablet by mouth at onset  of headache. May repeat in 2 hours if headache persists or recurs.    Marland Kitchen EPINEPHrine (EPI-PEN) 0.3 mg/0.3 mL DEVI Inject 0.3 mg into the muscle as needed.    . hydroxychloroquine (PLAQUENIL) 200 MG tablet hydroxychloroquine 200 mg tablet    . imipramine (TOFRANIL) 25 MG tablet Take 25 mg by mouth daily.  5  . doxycycline (VIBRAMYCIN) 100 MG capsule Take 1 capsule (100 mg total) by mouth 2 (two) times daily. (Patient not taking: Reported on 06/25/2018) 14 capsule 0  . ketoconazole (NIZORAL) 2 % shampoo Apply 1 application topically 2 (two) times a week.    . phenazopyridine (PYRIDIUM) 200 MG tablet Take 200 mg by mouth 3 (three) times daily as needed for pain.    . Probiotic Product (DIGESTIVE ADVANTAGE PO) Take by mouth.     No current facility-administered medications for this visit.     Objective: BP 112/76 (BP Location: Left Arm, Patient Position: Sitting, Cuff Size: Normal)   Pulse (!) 101   Temp 98.4 F (36.9 C) (Oral)   Ht 5\' 1"  (1.549 m)   Wt 133 lb (60.3 kg)   SpO2 99%   BMI 25.13 kg/m  Retake of HR: 99 Gen: NAD, appears fatigued HEENT: Turbinates erythematous, TMs normal bilaterally, pharynx mildly erythematous with no tonsilar exudate or edema, no sinus tenderness CV: RRR no murmurs rubs or gallops Lungs: CTAB no crackles, wheeze, rhonchi Abdomen: soft/nontender/nondistended/normal bowel sounds. Ext: no edema Skin: warm, dry, no rash  Assessment/Plan:  Flu-like illness:  History and exam today are suggestive of viral process. Patients influenza test was negative. POC strep is negative. High  risk condition: autoimmune history  Patient will be treated with Tamiflu. With symptoms present and history of autoimmune symptoms, Tamiflu will be provided with ondansetron for nausea as needed. She has been able to eat and drink this morning without vomiting. Advised remaining hydrated and will progress diet slowly.  Will obtain a chest X-ray today due to history of pain with coughing  per patient. Lung exam is benign and VSS make pneumonia unlikely today.    Finally, we reviewed reasons to return to care including if symptoms worsen or persist or new concerns arise.  Danielle Catalina, FNP

## 2018-06-25 NOTE — Patient Instructions (Addendum)
Tamiflu has been sent to your pharmacy.  Benzonatate has been sent to pharmacy for your cough.  Zofran has been provided for nausea.  Your symptoms are most likely related to a viral illness. Please drink plenty of water so that your urine is pale yellow or clear. Also, get plenty of rest, use tylenol  as needed for discomfort and follow up if symptoms do not improve in 3 to 4 days, worsen, or you develop a fever >101.   Influenza, Adult Influenza is also called "the flu." It is an infection in the lungs, nose, and throat (respiratory tract). It is caused by a virus. The flu causes symptoms that are similar to symptoms of a cold. It also causes a high fever and body aches. The flu spreads easily from person to person (is contagious). Getting a flu shot (influenza vaccination) every year is the best way to prevent the flu. What are the causes? This condition is caused by the influenza virus. You can get the virus by:  Breathing in droplets that are in the air from the cough or sneeze of a person who has the virus.  Touching something that has the virus on it (is contaminated) and then touching your mouth, nose, or eyes. What increases the risk? Certain things may make you more likely to get the flu. These include:  Not washing your hands often.  Having close contact with many people during cold and flu season.  Touching your mouth, eyes, or nose without first washing your hands.  Not getting a flu shot every year. You may have a higher risk for the flu, along with serious problems such as a lung infection (pneumonia), if you:  Are older than 65.  Are pregnant.  Have a weakened disease-fighting system (immune system) because of a disease or taking certain medicines.  Have a long-term (chronic) illness, such as: ? Heart, kidney, or lung disease. ? Diabetes. ? Asthma.  Have a liver disorder.  Are very overweight (morbidly obese).  Have anemia. This is a condition that affects  your red blood cells. What are the signs or symptoms? Symptoms usually begin suddenly and last 4-14 days. They may include:  Fever and chills.  Headaches, body aches, or muscle aches.  Sore throat.  Cough.  Runny or stuffy (congested) nose.  Chest discomfort.  Not wanting to eat as much as normal (poor appetite).  Weakness or feeling tired (fatigue).  Dizziness.  Feeling sick to your stomach (nauseous) or throwing up (vomiting). How is this treated? If the flu is found early, you can be treated with medicine that can help reduce how bad the illness is and how long it lasts (antiviral medicine). This may be given by mouth (orally) or through an IV tube. Taking care of yourself at home can help your symptoms get better. Your doctor may suggest:  Taking over-the-counter medicines.  Drinking plenty of fluids. The flu often goes away on its own. If you have very bad symptoms or other problems, you may be treated in a hospital. Follow these instructions at home:     Activity  Rest as needed. Get plenty of sleep.  Stay home from work or school as told by your doctor. ? Do not leave home until you do not have a fever for 24 hours without taking medicine. ? Leave home only to visit your doctor. Eating and drinking  Take an ORS (oral rehydration solution). This is a drink that is sold at pharmacies and stores.  Drink enough fluid to keep your pee (urine) pale yellow.  Drink clear fluids in small amounts as you are able. Clear fluids include: ? Water. ? Ice chips. ? Fruit juice that has water added (diluted fruit juice). ? Low-calorie sports drinks.  Eat bland, easy-to-digest foods in small amounts as you are able. These foods include: ? Bananas. ? Applesauce. ? Rice. ? Lean meats. ? Toast. ? Crackers.  Do not eat or drink: ? Fluids that have a lot of sugar or caffeine. ? Alcohol. ? Spicy or fatty foods. General instructions  Take over-the-counter and  prescription medicines only as told by your doctor.  Use a cool mist humidifier to add moisture to the air in your home. This can make it easier for you to breathe.  Cover your mouth and nose when you cough or sneeze.  Wash your hands with soap and water often, especially after you cough or sneeze. If you cannot use soap and water, use alcohol-based hand sanitizer.  Keep all follow-up visits as told by your doctor. This is important. How is this prevented?   Get a flu shot every year. You may get the flu shot in late summer, fall, or winter. Ask your doctor when you should get your flu shot.  Avoid contact with people who are sick during fall and winter (cold and flu season). Contact a doctor if:  You get new symptoms.  You have: ? Chest pain. ? Watery poop (diarrhea). ? A fever.  Your cough gets worse.  You start to have more mucus.  You feel sick to your stomach.  You throw up. Get help right away if you:  Have shortness of breath.  Have trouble breathing.  Have skin or nails that turn a bluish color.  Have very bad pain or stiffness in your neck.  Get a sudden headache.  Get sudden pain in your face or ear.  Cannot eat or drink without throwing up. Summary  Influenza ("the flu") is an infection in the lungs, nose, and throat. It is caused by a virus.  Take over-the-counter and prescription medicines only as told by your doctor.  Getting a flu shot every year is the best way to avoid getting the flu. This information is not intended to replace advice given to you by your health care provider. Make sure you discuss any questions you have with your health care provider. Document Released: 01/30/2008 Document Revised: 10/08/2017 Document Reviewed: 10/08/2017 Elsevier Interactive Patient Education  2019 [employer].

## 2018-07-02 ENCOUNTER — Ambulatory Visit: Payer: BLUE CROSS/BLUE SHIELD | Admitting: Family Medicine

## 2018-10-06 DIAGNOSIS — G43719 Chronic migraine without aura, intractable, without status migrainosus: Secondary | ICD-10-CM | POA: Diagnosis not present

## 2018-10-06 DIAGNOSIS — G43019 Migraine without aura, intractable, without status migrainosus: Secondary | ICD-10-CM | POA: Diagnosis not present

## 2018-10-29 DIAGNOSIS — Z79899 Other long term (current) drug therapy: Secondary | ICD-10-CM | POA: Diagnosis not present

## 2018-10-30 DIAGNOSIS — H10823 Rosacea conjunctivitis, bilateral: Secondary | ICD-10-CM | POA: Diagnosis not present

## 2018-11-11 DIAGNOSIS — H10823 Rosacea conjunctivitis, bilateral: Secondary | ICD-10-CM | POA: Diagnosis not present

## 2018-12-10 DIAGNOSIS — D2272 Melanocytic nevi of left lower limb, including hip: Secondary | ICD-10-CM | POA: Diagnosis not present

## 2018-12-10 DIAGNOSIS — D225 Melanocytic nevi of trunk: Secondary | ICD-10-CM | POA: Diagnosis not present

## 2018-12-10 DIAGNOSIS — A499 Bacterial infection, unspecified: Secondary | ICD-10-CM | POA: Diagnosis not present

## 2018-12-10 DIAGNOSIS — L723 Sebaceous cyst: Secondary | ICD-10-CM | POA: Diagnosis not present

## 2018-12-14 DIAGNOSIS — L930 Discoid lupus erythematosus: Secondary | ICD-10-CM | POA: Diagnosis not present

## 2018-12-14 DIAGNOSIS — D8989 Other specified disorders involving the immune mechanism, not elsewhere classified: Secondary | ICD-10-CM | POA: Diagnosis not present

## 2018-12-14 DIAGNOSIS — M2550 Pain in unspecified joint: Secondary | ICD-10-CM | POA: Diagnosis not present

## 2019-01-27 DIAGNOSIS — Z6825 Body mass index (BMI) 25.0-25.9, adult: Secondary | ICD-10-CM | POA: Diagnosis not present

## 2019-01-27 DIAGNOSIS — Z01419 Encounter for gynecological examination (general) (routine) without abnormal findings: Secondary | ICD-10-CM | POA: Diagnosis not present

## 2019-01-27 DIAGNOSIS — Z3040 Encounter for surveillance of contraceptives, unspecified: Secondary | ICD-10-CM | POA: Diagnosis not present

## 2019-01-27 DIAGNOSIS — Z1231 Encounter for screening mammogram for malignant neoplasm of breast: Secondary | ICD-10-CM | POA: Diagnosis not present

## 2019-01-28 ENCOUNTER — Other Ambulatory Visit: Payer: Self-pay | Admitting: Obstetrics and Gynecology

## 2019-01-28 DIAGNOSIS — R928 Other abnormal and inconclusive findings on diagnostic imaging of breast: Secondary | ICD-10-CM

## 2019-02-03 ENCOUNTER — Ambulatory Visit
Admission: RE | Admit: 2019-02-03 | Discharge: 2019-02-03 | Disposition: A | Discharge status: Home / Self Care | Payer: BLUE CROSS/BLUE SHIELD | Source: Ambulatory Visit | Attending: Obstetrics and Gynecology | Admitting: Obstetrics and Gynecology

## 2019-02-03 ENCOUNTER — Ambulatory Visit
Admission: RE | Admit: 2019-02-03 | Discharge: 2019-02-03 | Disposition: A | Discharge status: Home / Self Care | Payer: BC Managed Care – PPO | Source: Ambulatory Visit | Attending: Obstetrics and Gynecology | Admitting: Obstetrics and Gynecology

## 2019-02-03 ENCOUNTER — Other Ambulatory Visit: Payer: Self-pay | Admitting: Obstetrics and Gynecology

## 2019-02-03 ENCOUNTER — Other Ambulatory Visit: Payer: Self-pay

## 2019-02-03 DIAGNOSIS — R928 Other abnormal and inconclusive findings on diagnostic imaging of breast: Secondary | ICD-10-CM

## 2019-02-03 DIAGNOSIS — R922 Inconclusive mammogram: Secondary | ICD-10-CM | POA: Diagnosis not present

## 2019-02-03 DIAGNOSIS — N6314 Unspecified lump in the right breast, lower inner quadrant: Secondary | ICD-10-CM | POA: Diagnosis not present

## 2019-02-03 DIAGNOSIS — N6310 Unspecified lump in the right breast, unspecified quadrant: Secondary | ICD-10-CM

## 2019-02-03 DIAGNOSIS — N6320 Unspecified lump in the left breast, unspecified quadrant: Secondary | ICD-10-CM

## 2019-02-03 DIAGNOSIS — N6323 Unspecified lump in the left breast, lower outer quadrant: Secondary | ICD-10-CM | POA: Diagnosis not present

## 2019-03-09 DIAGNOSIS — Z79899 Other long term (current) drug therapy: Secondary | ICD-10-CM | POA: Diagnosis not present

## 2019-03-19 DIAGNOSIS — M2550 Pain in unspecified joint: Secondary | ICD-10-CM | POA: Diagnosis not present

## 2019-03-19 DIAGNOSIS — M25551 Pain in right hip: Secondary | ICD-10-CM | POA: Diagnosis not present

## 2019-03-19 DIAGNOSIS — D8989 Other specified disorders involving the immune mechanism, not elsewhere classified: Secondary | ICD-10-CM | POA: Diagnosis not present

## 2019-03-19 DIAGNOSIS — L930 Discoid lupus erythematosus: Secondary | ICD-10-CM | POA: Diagnosis not present

## 2019-05-11 DIAGNOSIS — G43719 Chronic migraine without aura, intractable, without status migrainosus: Secondary | ICD-10-CM | POA: Diagnosis not present

## 2019-05-11 DIAGNOSIS — G43019 Migraine without aura, intractable, without status migrainosus: Secondary | ICD-10-CM | POA: Diagnosis not present

## 2019-06-10 ENCOUNTER — Ambulatory Visit: Payer: BC Managed Care – PPO | Admitting: Allergy & Immunology

## 2019-06-10 ENCOUNTER — Encounter: Payer: Self-pay | Admitting: Allergy & Immunology

## 2019-06-10 ENCOUNTER — Other Ambulatory Visit: Payer: Self-pay

## 2019-06-10 VITALS — BP 118/78 | HR 72 | Temp 97.7°F | Ht 61.0 in | Wt 146.2 lb

## 2019-06-10 DIAGNOSIS — J302 Other seasonal allergic rhinitis: Secondary | ICD-10-CM | POA: Diagnosis not present

## 2019-06-10 DIAGNOSIS — T8069XA Other serum reaction due to other serum, initial encounter: Secondary | ICD-10-CM

## 2019-06-10 DIAGNOSIS — J3089 Other allergic rhinitis: Secondary | ICD-10-CM

## 2019-06-10 DIAGNOSIS — T7800XD Anaphylactic reaction due to unspecified food, subsequent encounter: Secondary | ICD-10-CM | POA: Diagnosis not present

## 2019-06-10 NOTE — Progress Notes (Signed)
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 Concern for vaccine reaction - I would get either the [place] or [place] since they do not contain polysorbate (which the preservative in the flu shot that you reacted to). - Of the two, Levan Hurst has a much lower anaphylaxis rate compared to [place] home and take Miralax with a an EpiPen handy. - Do this first thing in the morning so you are not anaphylaxising in the middle of the night.  - Call us with an update.  2. Food allergies - We are going to recheck you allergy levels.  - We will call you in 1-2 weeks with the results of the testing.  - EpiPen is up to date.   3. Return if symptoms worsen or fail to improve. This can be an in-person, a virtual Webex or a telephone follow up visit.   Subjective:   [name] is a 44 y.o. female presenting today for evaluation of  Chief Complaint  Patient presents with  . Establish Care    COVID vaccine    [name] has a history of the following: Patient Active Problem List   Diagnosis Date Noted  . 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 Epidermoid cyst of skin 05/19/2018  . Hyperbilirubinemia 07/30/2017  . Routine general medical examination at a health care facility 04/07/2017  . Ulnar neuritis, right 11/23/2016  . Migraine 09/25/2016  . Von Willebrand disease ([place]) 11/03/2010  . Atrial fibrillation ([place]) 06/15/2008    History obtained from: chart review and patient.  [name] was referred by Janith Lima, MD.     [name] is a 45 y.o. female presenting for an evaluation of vaccine allergies.  She was previously followed by Dr. Ishmael Holter in our office.  Her last  office visit was February 08, 2010.  At that time, she was having reactions to various foods.  Dr. Ishmael Holter did consider doing specific IgE testing, but it does not seem that this was done.  She recommended continued avoidance of shrimp, pineapple, cardamom, and sesame seeds.  She did recommend considering a shrimp challenge.  She has a history of food and drug allergies. She is concerned with the COVID vaccine. She has never reacted to a vaccine. She does get sick after the flu vaccine but she is now getting the preservative free vaccines do not affect her at all. She now asks for the preservative free one and is perfectly fine getting that.  She has never had any of the medications that use polyethylene glycol as a stabilizer.  She has never used MiraLAX at all, although one of her children has MiraLAX at home that she could try.   Regarding her food allergies, she is open to getting retested.  She currently avoid shrimp, pineapple, cardamom, and sesame seeds.  She has had no accidental ingestions.  She does have an up-to-date epinephrine autoinjector.   She denies any history of allergic rhinitis symptoms.  Her last testing for environmental allergens seems to been in 2011.  At that time, she was positive to grasses as well as trees and mold mix #4 (perennial molds).  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  She  is on Plaquenil for this, which seems to be controlling her symptoms fairly well.  Otherwise, there is no history of other atopic diseases, including asthma, drug allergies, stinging insect allergies, eczema, urticaria or contact dermatitis. There is no significant infectious history. Vaccinations are up to date.    Past Medical History: Patient Active Problem List   Diagnosis Date Noted  . 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 Epidermoid cyst of skin 05/19/2018  . Hyperbilirubinemia 07/30/2017  . Routine general medical examination at a health care facility 04/07/2017  . Ulnar neuritis,  right 11/23/2016  . Migraine 09/25/2016  . Von Willebrand disease (HCC) 11/03/2010  . Atrial fibrillation (HCC) 06/15/2008    Medication List:  Allergies as of 06/10/2019      Reactions   Avocado Nausea And Vomiting   Pineapple Hives   Sesame Oil Hives   Almond Oil    Cardamom [elettaria Cardamomum Minuscula]    Imitrex [sumatriptan]    SOB   Prochlorperazine Edisylate Other (See Comments)   Patient states that the reaction to the compazine is lockjaw.   Sulfonamide Derivatives Hives      Medication List       Accurate as of June 10, 2019 11:59 PM. If you have any questions, ask your nurse or doctor.        STOP taking these medications   benzonatate 100 MG capsule Commonly known as: TESSALON Stopped by: Alfonse Spruce, MD   DIGESTIVE ADVANTAGE PO Stopped by: Alfonse Spruce, MD   doxycycline 100 MG capsule Commonly known as: VIBRAMYCIN Stopped by: Alfonse Spruce, MD   ketoconazole 2 % shampoo Commonly known as: NIZORAL Stopped by: Alfonse Spruce, MD   ondansetron 4 MG tablet Commonly known as: Zofran Stopped by: Alfonse Spruce, MD   oseltamivir 75 MG capsule Commonly known as: Tamiflu Stopped by: Alfonse Spruce, MD   phenazopyridine 200 MG tablet Commonly known as: PYRIDIUM Stopped by: Alfonse Spruce, MD     TAKE these medications   calcium carbonate 500 MG chewable tablet Commonly known as: TUMS - dosed in mg elemental calcium Chew 1 tablet by mouth daily.   cetirizine 10 MG tablet Commonly known as: ZYRTEC Take 10 mg by mouth daily.   Cryselle-28 0.3-30 MG-MCG tablet Generic drug: norgestrel-ethinyl estradiol Take 1 tablet by mouth daily.   diphenhydrAMINE 25 MG tablet Commonly known as: BENADRYL Take 25 mg by mouth every 6 (six) hours as needed.   EPINEPHrine 0.3 mg/0.3 mL Devi Commonly known as: EPI-PEN Inject 0.3 mg into the muscle as needed.   hydroxychloroquine 200 MG tablet Commonly known  as: PLAQUENIL hydroxychloroquine 200 mg tablet   imipramine 25 MG tablet Commonly known as: TOFRANIL Take 25 mg by mouth daily.   OMEGA-3-6-9 PO Take 1 capsule by mouth daily.   Relpax 40 MG tablet Generic drug: eletriptan Take 40 mg by mouth as needed for migraine. One tablet by mouth at onset of headache. May repeat in 2 hours if headache persists or recurs.   WOMENS MULTIVITAMIN PO Take 1 tablet by mouth daily.       Birth History: non-contributory  Developmental History: non-contributory  Past Surgical History: Past Surgical History:  Procedure Laterality Date  . CESAREAN SECTION     3  . CESAREAN SECTION  03/08/2011  . HAND SURGERY  2003  . HERNIA REPAIR    . repair fracture leg  2003  . TONSILLECTOMY     teenager     Family  History: Family History  Problem Relation Age of Onset  . Hypertension Father        HAD CATH THAT WAS NORMAL  . Other Mother        PALPITATION  . Cancer Paternal Grandmother        thryoid     Social History: Taleyah lives at home with her family.  She lives in a house that is 42 years old.  There is hardwood throughout the main living areas and carpeting in the bedrooms.  She has gas heating and central cooling.  There is a goldendoodle in the home.  There are no dust mite covers.  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  There are no chemical exposures.   Review of Systems  Constitutional: Negative.  Negative for chills, fever, malaise/fatigue and weight loss.  HENT: Negative.  Negative for congestion, ear discharge and ear pain.   Eyes: Negative for pain, discharge and redness.  Respiratory: Negative for cough, sputum production, shortness of breath and wheezing.   Cardiovascular: Negative.  Negative for chest pain and palpitations.  Gastrointestinal: Negative for abdominal pain, constipation, diarrhea, heartburn, nausea and vomiting.  Skin: Negative.  Negative for itching and rash.  Neurological:  Negative for dizziness and headaches.  Endo/Heme/Allergies: Positive for environmental allergies. Does not bruise/bleed easily.       Positive for food allergies.       Objective:   Blood pressure 118/78, pulse 72, temperature 97.7 F (36.5 C), temperature source Temporal, height 5\' 1"  (1.549 m), weight 146 lb 3.2 oz (66.3 kg), SpO2 99 %. Body mass index is 27.62 kg/m.   Physical Exam:   Physical Exam  Constitutional: She appears well-developed.  HENT:  Head: Normocephalic and atraumatic.  Right Ear: Tympanic membrane, external ear and ear canal normal. No drainage, swelling or tenderness. Tympanic membrane is not injected, not scarred, not erythematous, not retracted and not bulging.  Left Ear: Tympanic membrane, external ear and ear canal normal. No drainage, swelling or tenderness. Tympanic membrane is not injected, not scarred, not erythematous, not retracted and not bulging.  Nose: No mucosal edema, rhinorrhea, nasal deformity or septal deviation. No epistaxis. Right sinus exhibits no maxillary sinus tenderness and no frontal sinus tenderness. Left sinus exhibits no maxillary sinus tenderness and no frontal sinus tenderness.  Mouth/Throat: Uvula is midline and oropharynx is clear and moist. Mucous membranes are not pale and not dry.  Eyes: Pupils are equal, round, and reactive to light. Conjunctivae and EOM are normal. Right eye exhibits no chemosis and no discharge. Left eye exhibits no chemosis and no discharge. Right conjunctiva is not injected. Left conjunctiva is not injected.  Cardiovascular: Normal rate, regular rhythm and normal heart sounds.  No murmur heard. Respiratory: Effort normal and breath sounds normal. No accessory muscle usage. No tachypnea. No respiratory distress. She has no wheezes. She has no rhonchi. She has no rales. She exhibits no tenderness.  GI: There is no abdominal tenderness. There is no rebound and no guarding.  Lymphadenopathy:       Head (right  side): No submandibular, no tonsillar and no occipital adenopathy present.       Head (left side): No submandibular, no tonsillar and no occipital adenopathy present.    She has no cervical adenopathy.  Neurological: She is alert.  Skin: Skin is warm and dry. No abrasion, no petechiae and no rash noted. Rash is not papular, not vesicular and not urticarial. No erythema. No  pallor.  Psychiatric: She has a normal mood and affect.     Diagnostic studies: none (labs sent instead)      Malachi Bonds, MD [facility]

## 2019-06-10 NOTE — Patient Instructions (Addendum)
1. Concern for vaccine reaction - I would get either the Moderna or Pfizer since they do not contain polysorbate (which the preservative in the flu shot that you reacted to). - Of the two, Gala [place] has a much lower anaphylaxis rate compared to [employer]. - Go home and take Miralax with a an EpiPen handy. - Do this first thing in the morning so you are not anaphylaxising in the middle of the night.  - Call us with an update.  2. Food allergies - We are going to recheck you allergy levels.  - We will call you in 1-2 weeks with the results of the testing.  - EpiPen is up to date.   3. Return if symptoms worsen or fail to improve. This can be an in-person, a virtual Webex or a telephone follow up visit.   Please inform us of any Emergency Department visits, hospitalizations, or changes in symptoms. Call us before going to the ED for breathing or allergy symptoms since we might be able to fit you in for a sick visit. Feel free to contact us anytime with any questions, problems, or concerns.  It was a pleasure to see you again today!  Websites that have reliable patient information: 1. American Academy of Asthma, Allergy, and Immunology: www.aaaai.org 2. Food Allergy Research and Education (FARE): foodallergy.org 3. Mothers of Asthmatics: http://www.asthmacommunitynetwork.org 4. American College of Allergy, Asthma, and Immunology: www.acaai.org   COVID-19 Vaccine Information can be found at: PodExchange.nl For questions related to vaccine distribution or appointments, please email [email or web address] or call [phone].     "Like" [place] on Facebook and Instagram for our latest updates!        Make sure you are registered to vote! If you have moved or changed any of your contact information, you will need to get this updated before voting!  In some cases, you MAY be able to register to vote online:  [email or web address]

## 2019-06-16 LAB — TRYPTASE: Tryptase: 5.1 ug/L (ref 2.2–13.2)

## 2019-06-16 LAB — ALLERGEN, CARDAMON SEED
Cardamon Seed IgE*: <0.1 kU/L (ref <0.35)
Class Interpretation: 0

## 2019-06-16 LAB — ALLERGEN AVOCADO F96: F096-IgE Avocado: <0.1 kU/L

## 2019-06-16 LAB — ALLERGEN, PINEAPPLE, F210: Pineapple IgE: <0.1 kU/L

## 2019-06-16 LAB — ALLERGEN SESAME F10: Sesame Seed IgE: <0.1 kU/L

## 2019-06-16 LAB — F020-IGE ALMOND: F020-IgE Almond: <0.1 kU/L

## 2019-06-22 ENCOUNTER — Telehealth: Payer: Self-pay

## 2019-06-22 DIAGNOSIS — D8989 Other specified disorders involving the immune mechanism, not elsewhere classified: Secondary | ICD-10-CM | POA: Diagnosis not present

## 2019-06-22 DIAGNOSIS — L930 Discoid lupus erythematosus: Secondary | ICD-10-CM | POA: Diagnosis not present

## 2019-06-22 DIAGNOSIS — M2550 Pain in unspecified joint: Secondary | ICD-10-CM | POA: Diagnosis not present

## 2019-06-22 DIAGNOSIS — M25551 Pain in right hip: Secondary | ICD-10-CM | POA: Diagnosis not present

## 2019-06-22 DIAGNOSIS — Z20818 Contact with and (suspected) exposure to other bacterial communicable diseases: Secondary | ICD-10-CM | POA: Diagnosis not present

## 2019-06-22 NOTE — Telephone Encounter (Signed)
Called and spoke with patient regarding lab results. She seemed surprised that all foods were negative and was wondering what her old lab values were back in 2011 when she had these foods tested then with Dr. Willa Rough. Did you have these values still for comparison? Or do we need to look them up in medical records to view. Please advise.

## 2019-06-22 NOTE — Telephone Encounter (Signed)
The records are on the S drive. Dr Dellis Anes I will send to you via email.

## 2019-06-22 NOTE — Telephone Encounter (Signed)
Patient called to discuss her lab results. She did receive them via MyChart. Patient states she also took the Mirlax and didn't have any issues.   Please Advise.

## 2019-06-25 NOTE — Telephone Encounter (Signed)
Patient verbalized understanding and will attempt to try the foods at home in moderation if she has any issues she will contact us. Advise to stop antihistamines 3 days prior to make sure she doesn't have any in her system and to try a small amount increasing gradually. Please advise on further recommendations

## 2019-06-25 NOTE — Telephone Encounter (Signed)
I found her old notes that showed the following numbers:  1. Avocado: 5.92 2. Almond: 0.68 3. Pineapple: 1.11 4. Cardamom: 0.60 5. Sesame: 1.14  Skin testing (August 2011): 2+ to sesame, +/- to celery, negative to onion, negative mustard, negative to pepper, negative to garlic, negative to cinnamon, negative to lamb, light negative to beef, negative to pork, negative to orange, negative to oyster, negative to lobster, negative to scallops, negative to salmon, negative to peanut, negative to soy, negative to wheat, negative to corn, negative to milk, negative to egg white, negative to casein, negative to shellfish mix, negative to fish mix.  Skin testing (October 2011): +/- to almond, negative to hazelnut, negative to [place] nut, negative to walnut, negative to pecan, and negative to cashew.   Malachi Bonds, MD [facility]

## 2019-06-25 NOTE — Telephone Encounter (Signed)
Sounds like a plan, Wynona Canes! Agree with your plan as written.   Malachi Bonds, MD [facility]

## 2019-07-01 ENCOUNTER — Emergency Department (HOSPITAL_COMMUNITY)
Admission: EM | Admit: 2019-07-01 | Discharge: 2019-07-01 | Disposition: A | Discharge status: Home / Self Care | Payer: BC Managed Care – PPO | Attending: Emergency Medicine | Admitting: Emergency Medicine

## 2019-07-01 ENCOUNTER — Emergency Department (HOSPITAL_COMMUNITY): Payer: BC Managed Care – PPO

## 2019-07-01 ENCOUNTER — Other Ambulatory Visit: Payer: Self-pay

## 2019-07-01 ENCOUNTER — Encounter (HOSPITAL_COMMUNITY): Payer: Self-pay | Admitting: Emergency Medicine

## 2019-07-01 DIAGNOSIS — R1031 Right lower quadrant pain: Secondary | ICD-10-CM | POA: Insufficient documentation

## 2019-07-01 DIAGNOSIS — R112 Nausea with vomiting, unspecified: Secondary | ICD-10-CM | POA: Diagnosis not present

## 2019-07-01 DIAGNOSIS — R1110 Vomiting, unspecified: Secondary | ICD-10-CM | POA: Diagnosis not present

## 2019-07-01 DIAGNOSIS — Z79899 Other long term (current) drug therapy: Secondary | ICD-10-CM | POA: Diagnosis not present

## 2019-07-01 DIAGNOSIS — L930 Discoid lupus erythematosus: Secondary | ICD-10-CM | POA: Insufficient documentation

## 2019-07-01 DIAGNOSIS — D680 Von Willebrand's disease: Secondary | ICD-10-CM | POA: Insufficient documentation

## 2019-07-01 LAB — URINALYSIS, ROUTINE W REFLEX MICROSCOPIC
Bilirubin Urine: NEGATIVE
Glucose, UA: NEGATIVE mg/dL
Hgb urine dipstick: NEGATIVE
Ketones, ur: NEGATIVE mg/dL
Leukocytes,Ua: NEGATIVE
Nitrite: NEGATIVE
Protein, ur: NEGATIVE mg/dL
Specific Gravity, Urine: 1.014 (ref 1.005–1.030)
pH: 7 (ref 5.0–8.0)

## 2019-07-01 LAB — COMPREHENSIVE METABOLIC PANEL
ALT: 23 U/L (ref 0–44)
AST: 22 U/L (ref 15–41)
Albumin: 3.9 g/dL (ref 3.5–5.0)
Alkaline Phosphatase: 41 U/L (ref 38–126)
Anion gap: 9 (ref 5–15)
BUN: 12 mg/dL (ref 6–20)
CO2: 27 mmol/L (ref 22–32)
Calcium: 9.6 mg/dL (ref 8.9–10.3)
Chloride: 105 mmol/L (ref 98–111)
Creatinine, Ser: 0.98 mg/dL (ref 0.44–1.00)
GFR calc Af Amer: >60 mL/min (ref >60)
GFR calc non Af Amer: >60 mL/min (ref >60)
Glucose, Bld: 107 mg/dL — ABNORMAL HIGH (ref 70–99)
Potassium: 3.9 mmol/L (ref 3.5–5.1)
Sodium: 141 mmol/L (ref 135–145)
Total Bilirubin: 0.5 mg/dL (ref 0.3–1.2)
Total Protein: 7 g/dL (ref 6.5–8.1)

## 2019-07-01 LAB — CBC
HCT: 41.7 % (ref 36.0–46.0)
Hemoglobin: 13.4 g/dL (ref 12.0–15.0)
MCH: 29.8 pg (ref 26.0–34.0)
MCHC: 32.1 g/dL (ref 30.0–36.0)
MCV: 92.9 fL (ref 80.0–100.0)
Platelets: 202 10*3/uL (ref 150–400)
RBC: 4.49 MIL/uL (ref 3.87–5.11)
RDW: 11.9 % (ref 11.5–15.5)
WBC: 6 10*3/uL (ref 4.0–10.5)
nRBC: 0 % (ref 0.0–0.2)

## 2019-07-01 LAB — LIPASE, BLOOD: Lipase: 39 U/L (ref 11–51)

## 2019-07-01 LAB — I-STAT BETA HCG BLOOD, ED (MC, WL, AP ONLY): I-stat hCG, quantitative: <5 m[IU]/mL (ref <5)

## 2019-07-01 MED ORDER — SODIUM CHLORIDE 0.9% FLUSH: 3.0000 mL | Freq: Once | INTRAVENOUS | Status: DC

## 2019-07-01 MED ORDER — IOHEXOL 300 MG/ML  SOLN
100.0000 mL | Freq: Once | INTRAMUSCULAR | Status: AC | PRN
  Administered 2019-07-01: 100 mL via INTRAVENOUS

## 2019-07-01 NOTE — ED Provider Notes (Signed)
[facility] EMERGENCY DEPARTMENT Provider Note   CSN: [id number] Arrival date & time: 07/01/19  1826     History Chief Complaint  Patient presents with  . Abdominal Pain    [name] is a 44 y.o. female.  The history is provided by the patient and medical records.  Abdominal Pain Associated symptoms: nausea and vomiting     44 y.o. F with hx of GERD, AFIB in pregnancy, Von Willebrand's disease, discoid lupus,  presenting to the ED for RLQ abdominal pain.  States this started around lunch time today and has gotten steadily worse since then.  States she did have relief of pain about an hour ago, pain no 3/10 but was 10/10.  She had 1 episode of vomiting but feels like it is due to pain.  Pain remains localized to RLQ, does feel some radiation of pain into the right shoulder and right back.  She denies urinary symptoms.  No hx of kidney stones.  No fever/chills.  Prior abdominal surgeries include c-section x3 and hernia repair.  States she does tend to get stomach pains quite often but this was much more severe than normal.   Past Medical History:  Diagnosis Date  . Broken leg    BROKEN LEG AND HAND AFTER ACCIDENT  . GERD (gastroesophageal reflux disease)   . Hx of atrial fibrillation, no current medication    PAT with pregnancy only-with last 2 pregnancy and currrent-using labetalol prn  . MVP (mitral valve prolapse)    MILD WITH NORMAL LV FX ON ECHO 4/08  . Upper respiratory infection current   finished Z Pak, new prescription for Augmentum to start today 03/04/11  . Von Willebrand disease ([place])    Hematologist- [facility]-pt states mild and gone during pregnancy    Patient Active Problem List   Diagnosis Date Noted  . 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 Epidermoid cyst of skin 05/19/2018  . Hyperbilirubinemia 07/30/2017  . Routine general medical examination at a health care facility 04/07/2017  . Ulnar neuritis, right 11/23/2016  .  Migraine 09/25/2016  . Von Willebrand disease ([place]) 11/03/2010  . Atrial fibrillation ([place]) 06/15/2008    Past Surgical History:  Procedure Laterality Date  . CESAREAN SECTION     3  . CESAREAN SECTION  03/08/2011  . HAND SURGERY  2003  . HERNIA REPAIR    . repair fracture leg  2003  . TONSILLECTOMY     teenager     OB History    Gravida  3   Para  3   Term  3   Preterm  0   AB  0   Living  3     SAB  0   TAB  0   Ectopic  0   Multiple  0   Live Births  3           Family History  Problem Relation Age of Onset  . Hypertension Father        HAD CATH THAT WAS NORMAL  . Other Mother        PALPITATION  . Cancer Paternal Grandmother        thryoid    Social History   Tobacco Use  . Smoking status: Never Smoker  . Smokeless tobacco: Never Used  Substance Use Topics  . Alcohol use: Yes    Alcohol/week: 1.0 - 2.0 standard drinks    Types: 1 - 2 Cans of beer per week  Comment: week  . Drug use: No    Home Medications Prior to Admission medications   Medication Sig Start Date End Date Taking? Authorizing Provider  calcium carbonate (TUMS - DOSED IN MG ELEMENTAL CALCIUM) 500 MG chewable tablet Chew 1 tablet by mouth daily.    [provider]  cetirizine (ZYRTEC) 10 MG tablet Take 10 mg by mouth daily.    [provider]  CRYSELLE-28 0.3-30 MG-MCG tablet Take 1 tablet by mouth daily. 10/11/14   [provider]  diphenhydrAMINE (BENADRYL) 25 MG tablet Take 25 mg by mouth every 6 (six) hours as needed.    [provider]  eletriptan (RELPAX) 40 MG tablet Take 40 mg by mouth as needed for migraine. One tablet by mouth at onset of headache. May repeat in 2 hours if headache persists or recurs.    [provider]  EPINEPHrine (EPI-PEN) 0.3 mg/0.3 mL DEVI Inject 0.3 mg into the muscle as needed. 07/23/12   Lorre Munroe, NP  hydroxychloroquine (PLAQUENIL) 200 MG tablet hydroxychloroquine 200 mg tablet     [provider]  imipramine (TOFRANIL) 25 MG tablet Take 25 mg by mouth daily. 09/14/16   [provider]  Multiple Vitamins-Minerals (WOMENS MULTIVITAMIN PO) Take 1 tablet by mouth daily.    [provider]  Omega 3-6-9 Fatty Acids (OMEGA-3-6-9 PO) Take 1 capsule by mouth daily.    [provider]    Allergies    Avocado, Pineapple, Sesame oil, Almond oil, Cardamom [elettaria cardamomum minuscula], Imitrex [sumatriptan], Prochlorperazine edisylate, and Sulfonamide derivatives  Review of Systems   Review of Systems  Gastrointestinal: Positive for abdominal pain, nausea and vomiting.  All other systems reviewed and are negative.   Physical Exam Updated Vital Signs BP 128/84 (BP Location: Right Arm)   Pulse 88   Temp 98.6 F (37 C) (Oral)   Resp 17   Ht 5\' 1"  (1.549 m)   Wt 65.8 kg   SpO2 96%   BMI 27.40 kg/m   Physical Exam Vitals and nursing note reviewed.  Constitutional:      Appearance: She is well-developed.  HENT:     Head: Normocephalic and atraumatic.  Eyes:     Conjunctiva/sclera: Conjunctivae normal.     Pupils: Pupils are equal, round, and reactive to light.  Cardiovascular:     Rate and Rhythm: Normal rate and regular rhythm.     Heart sounds: Normal heart sounds.  Pulmonary:     Effort: Pulmonary effort is normal.     Breath sounds: Normal breath sounds.  Abdominal:     General: Bowel sounds are normal.     Palpations: Abdomen is soft.     Tenderness: There is abdominal tenderness in the right lower quadrant. There is no right CVA tenderness.  Musculoskeletal:        General: Normal range of motion.     Cervical back: Normal range of motion.  Skin:    General: Skin is warm and dry.  Neurological:     Mental Status: She is alert and oriented to person, place, and time.     ED Results / Procedures / Treatments   Labs (all labs ordered are listed, but only abnormal results are displayed) Labs Reviewed    COMPREHENSIVE METABOLIC PANEL - Abnormal; Notable for the following components:      Result Value   Glucose, Bld 107 (*)    All other components within normal limits  URINALYSIS, ROUTINE W REFLEX MICROSCOPIC - Abnormal;  Notable for the following components:   APPearance CLOUDY (*)    All other components within normal limits  URINE CULTURE  LIPASE, BLOOD  CBC  I-STAT BETA HCG BLOOD, ED ([facility], [facility], [facility] ONLY)    EKG None  Radiology CT ABDOMEN PELVIS W CONTRAST  Result Date: 07/01/2019 CLINICAL DATA:  Severe right lower quadrant pain radiating to back, nausea and vomiting EXAM: CT ABDOMEN AND PELVIS WITH CONTRAST TECHNIQUE: Multidetector CT imaging of the abdomen and pelvis was performed using the standard protocol following bolus administration of intravenous contrast. CONTRAST:  OMNIPAQUE IOHEXOL 300 MG/ML  SOLN COMPARISON:  None. FINDINGS: Lower chest: No acute pleural or parenchymal lung disease. Hepatobiliary: No focal liver abnormality is seen. No gallstones, gallbladder wall thickening, or biliary dilatation. Pancreas: Unremarkable. No pancreatic ductal dilatation or surrounding inflammatory changes. Spleen: Normal in size without focal abnormality. Adrenals/Urinary Tract: The kidneys enhance normally and symmetrically. No urinary tract calculi or obstructive uropathy. The bladder is unremarkable. Adrenals are normal. Stomach/Bowel: There is moderate stool within the colon. No bowel obstruction or ileus. Normal appendix right lower quadrant. No inflammatory changes. Vascular/Lymphatic: No significant vascular findings are present. No enlarged abdominal or pelvic lymph nodes. Reproductive: Uterus and bilateral adnexa are unremarkable. Other: No abdominal wall hernia or abnormality. No abdominopelvic ascites. Musculoskeletal: No acute or destructive bony lesions. Reconstructed images demonstrate no additional findings. IMPRESSION: 1. Normal appendix. 2. No acute intra-abdominal or  intrapelvic process. Electronically Signed   By: Sharlet Salina M.D.   On: 07/01/2019 23:31    Procedures Procedures (including critical care time)  Medications Ordered in ED Medications  sodium chloride flush (NS) 0.9 % injection 3 mL (has no administration in time range)    ED Course  I have reviewed the triage vital signs and the nursing notes.  Pertinent labs & imaging results that were available during my care of the patient were reviewed by me and considered in my medical decision making (see chart for details).    MDM Rules/Calculators/A&P  44 year old female here with right lower quadrant pain.  Onset today around lunch and has been worsening throughout the afternoon.  At its worst, pain was a 10, now currently a 3 without any medication or other intervention.  She is afebrile and nontoxic in appearance here.  She does have some tenderness in the right lower quadrant without peritoneal signs.  Her labs are overall reassuring.  UA without any signs of infection.  CT scan was obtained without any acute findings, specifically normal appendix.  On recheck, patient is resting comfortably.  She is pain-free and has not required any medications here.  Feel she is stable for discharge home.  We will have her monitor her symptoms closely at home and follow-up closely with her primary care doctor.  She may return here for any new or acute changes.  Final Clinical Impression(s) / ED Diagnoses Final diagnoses:  RLQ abdominal pain    Rx / DC Orders ED Discharge Orders    None       Garlon Hatchet, PA-C 07/02/19 0041    Terald Sleeper, MD 07/02/19 1137

## 2019-07-01 NOTE — ED Triage Notes (Signed)
Pt in POV. Reports "severe' RLQ abd pain with radiation into back. Also reports N/V, states it is secondary to pain

## 2019-07-01 NOTE — Discharge Instructions (Signed)
Labs and CT today were normal. Monitor your symptoms and follow-up with your primary care doctor. Return here for any new/acute changes-- fever, recurrent or worsening pain, vomiting, etc.

## 2019-07-02 LAB — URINE CULTURE: Culture: <10000 — AB

## 2019-08-04 ENCOUNTER — Other Ambulatory Visit: Payer: BC Managed Care – PPO

## 2019-08-13 ENCOUNTER — Other Ambulatory Visit: Payer: Self-pay

## 2019-08-13 ENCOUNTER — Other Ambulatory Visit: Payer: BC Managed Care – PPO

## 2019-09-16 DIAGNOSIS — F438 Other reactions to severe stress: Secondary | ICD-10-CM | POA: Diagnosis not present

## 2019-09-17 DIAGNOSIS — Z03818 Encounter for observation for suspected exposure to other biological agents ruled out: Secondary | ICD-10-CM | POA: Diagnosis not present

## 2019-09-18 DIAGNOSIS — J029 Acute pharyngitis, unspecified: Secondary | ICD-10-CM | POA: Diagnosis not present

## 2019-09-18 DIAGNOSIS — R519 Headache, unspecified: Secondary | ICD-10-CM | POA: Diagnosis not present

## 2019-09-18 DIAGNOSIS — R52 Pain, unspecified: Secondary | ICD-10-CM | POA: Diagnosis not present

## 2019-09-18 DIAGNOSIS — R0981 Nasal congestion: Secondary | ICD-10-CM | POA: Diagnosis not present

## 2019-09-20 ENCOUNTER — Other Ambulatory Visit: Payer: Self-pay | Admitting: Obstetrics and Gynecology

## 2019-09-20 ENCOUNTER — Other Ambulatory Visit: Payer: Self-pay

## 2019-09-20 ENCOUNTER — Ambulatory Visit
Admission: RE | Admit: 2019-09-20 | Discharge: 2019-09-20 | Disposition: A | Discharge status: Home / Self Care | Payer: BC Managed Care – PPO | Source: Ambulatory Visit | Attending: Obstetrics and Gynecology | Admitting: Obstetrics and Gynecology

## 2019-09-20 DIAGNOSIS — N6310 Unspecified lump in the right breast, unspecified quadrant: Secondary | ICD-10-CM

## 2019-09-20 DIAGNOSIS — N6011 Diffuse cystic mastopathy of right breast: Secondary | ICD-10-CM | POA: Diagnosis not present

## 2019-09-20 DIAGNOSIS — N6320 Unspecified lump in the left breast, unspecified quadrant: Secondary | ICD-10-CM

## 2019-09-20 DIAGNOSIS — N6012 Diffuse cystic mastopathy of left breast: Secondary | ICD-10-CM | POA: Diagnosis not present

## 2019-09-21 DIAGNOSIS — F438 Other reactions to severe stress: Secondary | ICD-10-CM | POA: Diagnosis not present

## 2019-10-01 DIAGNOSIS — F438 Other reactions to severe stress: Secondary | ICD-10-CM | POA: Diagnosis not present

## 2019-10-15 DIAGNOSIS — F438 Other reactions to severe stress: Secondary | ICD-10-CM | POA: Diagnosis not present

## 2019-10-22 DIAGNOSIS — F438 Other reactions to severe stress: Secondary | ICD-10-CM | POA: Diagnosis not present

## 2019-11-01 ENCOUNTER — Telehealth: Payer: BC Managed Care – PPO | Admitting: Family

## 2019-11-01 DIAGNOSIS — R0981 Nasal congestion: Secondary | ICD-10-CM | POA: Diagnosis not present

## 2019-11-01 DIAGNOSIS — R05 Cough: Secondary | ICD-10-CM | POA: Diagnosis not present

## 2019-11-01 DIAGNOSIS — R519 Headache, unspecified: Secondary | ICD-10-CM | POA: Diagnosis not present

## 2019-11-01 DIAGNOSIS — J329 Chronic sinusitis, unspecified: Secondary | ICD-10-CM | POA: Diagnosis not present

## 2019-11-10 DIAGNOSIS — G43019 Migraine without aura, intractable, without status migrainosus: Secondary | ICD-10-CM | POA: Diagnosis not present

## 2019-11-10 DIAGNOSIS — G43719 Chronic migraine without aura, intractable, without status migrainosus: Secondary | ICD-10-CM | POA: Diagnosis not present

## 2019-11-10 DIAGNOSIS — F438 Other reactions to severe stress: Secondary | ICD-10-CM | POA: Diagnosis not present

## 2019-11-12 ENCOUNTER — Encounter: Payer: Self-pay | Admitting: Internal Medicine

## 2019-11-12 ENCOUNTER — Telehealth (INDEPENDENT_AMBULATORY_CARE_PROVIDER_SITE_OTHER): Payer: BC Managed Care – PPO | Admitting: Internal Medicine

## 2019-11-12 DIAGNOSIS — J019 Acute sinusitis, unspecified: Secondary | ICD-10-CM | POA: Insufficient documentation

## 2019-11-12 DIAGNOSIS — J0110 Acute frontal sinusitis, unspecified: Secondary | ICD-10-CM

## 2019-11-12 MED ORDER — PREDNISONE 20 MG PO TABS: 40.0000 mg | ORAL_TABLET | Freq: Every day | ORAL | 0 refills | Status: DC

## 2019-11-12 MED ORDER — DOXYCYCLINE HYCLATE 100 MG PO TABS: 100.0000 mg | ORAL_TABLET | Freq: Two times a day (BID) | ORAL | 0 refills | Status: DC

## 2019-11-12 NOTE — Assessment & Plan Note (Signed)
Covid-19 testing negative during course and is fully vaccinated. Rx doxycycline and prednisone to start doxycycline now and prednisone in 2-3 days if no improvement. Advised to restart zyrtec daily to help also.

## 2019-11-12 NOTE — Progress Notes (Signed)
Virtual Visit via Video Note  I connected with [name] on 11/12/19 at  8:40 AM EDT by a video enabled telemedicine application and verified that I am speaking with the correct person using two identifiers.  The patient and the provider were at separate locations throughout the entire encounter. Patient location: home, Provider location: work   I discussed the limitations of evaluation and management by telemedicine and the availability of in person appointments. The patient expressed understanding and agreed to proceed. The patient and the provider were the only parties present for the visit unless noted in HPI below.  History of Present Illness: The patient is a 44 y.o. female with visit for ongoing sinus infection. Started about 2 weeks ago and was seen at urgent care about 2-3 days after onset of symptoms. Did have covid-19 testing and this was negative and given 1 week augmentin which did not help at all. She has sinus pressure and pain, left ear pain, drainage, cough. Denies SOB. [place] covid-19 test was negative and this caused a lot of sinus pain and ear pain bilateral. After getting home ear pain did reduce some but not go away. Taking benadryl but no allergy medication currently. Denies SOB. Overall it is worsening/not improving. She does have lupus but this does not feel like a flare.   Observations/Objective: Appearance: normal, coughing during visit non-productive, breathing appears normal, casual grooming, nasally sounding voice, pain to self palpation of frontal sinuses bilaterally, abdomen does not appear distended, throat with redness and drainage, memory normal, mental status is A and O times 3  Assessment and Plan: See problem oriented charting  Follow Up Instructions: rx doxycycline and if no response in 2-3 days rx prednisone to fill and take  I discussed the assessment and treatment plan with the patient. The patient was provided  an opportunity to ask questions and all were answered. The patient agreed with the plan and demonstrated an understanding of the instructions.   The patient was advised to call back or seek an in-person evaluation if the symptoms worsen or if the condition fails to improve as anticipated.  Myrlene Broker, MD

## 2019-11-26 DIAGNOSIS — F438 Other reactions to severe stress: Secondary | ICD-10-CM | POA: Diagnosis not present

## 2019-12-09 DIAGNOSIS — F438 Other reactions to severe stress: Secondary | ICD-10-CM | POA: Diagnosis not present

## 2019-12-23 DIAGNOSIS — F438 Other reactions to severe stress: Secondary | ICD-10-CM | POA: Diagnosis not present

## 2019-12-29 DIAGNOSIS — L821 Other seborrheic keratosis: Secondary | ICD-10-CM | POA: Diagnosis not present

## 2019-12-29 DIAGNOSIS — D225 Melanocytic nevi of trunk: Secondary | ICD-10-CM | POA: Diagnosis not present

## 2019-12-29 DIAGNOSIS — L931 Subacute cutaneous lupus erythematosus: Secondary | ICD-10-CM | POA: Diagnosis not present

## 2019-12-29 DIAGNOSIS — L578 Other skin changes due to chronic exposure to nonionizing radiation: Secondary | ICD-10-CM | POA: Diagnosis not present

## 2019-12-30 DIAGNOSIS — F438 Other reactions to severe stress: Secondary | ICD-10-CM | POA: Diagnosis not present

## 2020-01-03 DIAGNOSIS — D8989 Other specified disorders involving the immune mechanism, not elsewhere classified: Secondary | ICD-10-CM | POA: Diagnosis not present

## 2020-01-03 DIAGNOSIS — Z20828 Contact with and (suspected) exposure to other viral communicable diseases: Secondary | ICD-10-CM | POA: Diagnosis not present

## 2020-01-03 DIAGNOSIS — L930 Discoid lupus erythematosus: Secondary | ICD-10-CM | POA: Diagnosis not present

## 2020-01-03 DIAGNOSIS — M25551 Pain in right hip: Secondary | ICD-10-CM | POA: Diagnosis not present

## 2020-01-03 DIAGNOSIS — M2550 Pain in unspecified joint: Secondary | ICD-10-CM | POA: Diagnosis not present

## 2020-01-21 DIAGNOSIS — Z20828 Contact with and (suspected) exposure to other viral communicable diseases: Secondary | ICD-10-CM | POA: Diagnosis not present

## 2020-02-16 DIAGNOSIS — F438 Other reactions to severe stress: Secondary | ICD-10-CM | POA: Diagnosis not present

## 2020-03-09 DIAGNOSIS — L931 Subacute cutaneous lupus erythematosus: Secondary | ICD-10-CM | POA: Diagnosis not present

## 2020-03-09 DIAGNOSIS — L503 Dermatographic urticaria: Secondary | ICD-10-CM | POA: Diagnosis not present

## 2020-03-13 DIAGNOSIS — Z79899 Other long term (current) drug therapy: Secondary | ICD-10-CM | POA: Diagnosis not present

## 2020-03-20 DIAGNOSIS — Z01419 Encounter for gynecological examination (general) (routine) without abnormal findings: Secondary | ICD-10-CM | POA: Diagnosis not present

## 2020-03-20 DIAGNOSIS — Z6823 Body mass index (BMI) 23.0-23.9, adult: Secondary | ICD-10-CM | POA: Diagnosis not present

## 2020-03-20 DIAGNOSIS — Z13820 Encounter for screening for osteoporosis: Secondary | ICD-10-CM | POA: Diagnosis not present

## 2020-03-23 DIAGNOSIS — M2550 Pain in unspecified joint: Secondary | ICD-10-CM | POA: Diagnosis not present

## 2020-03-23 DIAGNOSIS — M25551 Pain in right hip: Secondary | ICD-10-CM | POA: Diagnosis not present

## 2020-03-23 DIAGNOSIS — L930 Discoid lupus erythematosus: Secondary | ICD-10-CM | POA: Diagnosis not present

## 2020-03-23 DIAGNOSIS — D8989 Other specified disorders involving the immune mechanism, not elsewhere classified: Secondary | ICD-10-CM | POA: Diagnosis not present

## 2020-03-24 DIAGNOSIS — Z1321 Encounter for screening for nutritional disorder: Secondary | ICD-10-CM | POA: Diagnosis not present

## 2020-03-24 DIAGNOSIS — Z1329 Encounter for screening for other suspected endocrine disorder: Secondary | ICD-10-CM | POA: Diagnosis not present

## 2020-03-24 DIAGNOSIS — Z13220 Encounter for screening for lipoid disorders: Secondary | ICD-10-CM | POA: Diagnosis not present

## 2020-03-24 DIAGNOSIS — Z13228 Encounter for screening for other metabolic disorders: Secondary | ICD-10-CM | POA: Diagnosis not present

## 2020-03-27 ENCOUNTER — Other Ambulatory Visit: Payer: BC Managed Care – PPO

## 2020-04-25 DIAGNOSIS — M2550 Pain in unspecified joint: Secondary | ICD-10-CM | POA: Diagnosis not present

## 2020-04-25 DIAGNOSIS — D8989 Other specified disorders involving the immune mechanism, not elsewhere classified: Secondary | ICD-10-CM | POA: Diagnosis not present

## 2020-04-25 DIAGNOSIS — M25551 Pain in right hip: Secondary | ICD-10-CM | POA: Diagnosis not present

## 2020-04-25 DIAGNOSIS — L930 Discoid lupus erythematosus: Secondary | ICD-10-CM | POA: Diagnosis not present

## 2020-04-26 ENCOUNTER — Other Ambulatory Visit: Payer: Self-pay

## 2020-04-26 ENCOUNTER — Ambulatory Visit
Admission: RE | Admit: 2020-04-26 | Discharge: 2020-04-26 | Disposition: A | Discharge status: Home / Self Care | Payer: BC Managed Care – PPO | Source: Ambulatory Visit | Attending: Obstetrics and Gynecology | Admitting: Obstetrics and Gynecology

## 2020-04-26 DIAGNOSIS — R922 Inconclusive mammogram: Secondary | ICD-10-CM | POA: Diagnosis not present

## 2020-04-26 DIAGNOSIS — N6310 Unspecified lump in the right breast, unspecified quadrant: Secondary | ICD-10-CM

## 2020-04-26 DIAGNOSIS — N6011 Diffuse cystic mastopathy of right breast: Secondary | ICD-10-CM | POA: Diagnosis not present

## 2020-04-26 DIAGNOSIS — N6012 Diffuse cystic mastopathy of left breast: Secondary | ICD-10-CM | POA: Diagnosis not present

## 2020-05-02 DIAGNOSIS — E673 Hypervitaminosis D: Secondary | ICD-10-CM | POA: Diagnosis not present

## 2020-05-10 DIAGNOSIS — D2372 Other benign neoplasm of skin of left lower limb, including hip: Secondary | ICD-10-CM | POA: Diagnosis not present

## 2020-05-10 DIAGNOSIS — L930 Discoid lupus erythematosus: Secondary | ICD-10-CM | POA: Diagnosis not present

## 2020-05-10 DIAGNOSIS — M79672 Pain in left foot: Secondary | ICD-10-CM | POA: Diagnosis not present

## 2020-05-10 DIAGNOSIS — L503 Dermatographic urticaria: Secondary | ICD-10-CM | POA: Diagnosis not present

## 2020-05-10 DIAGNOSIS — M792 Neuralgia and neuritis, unspecified: Secondary | ICD-10-CM | POA: Diagnosis not present

## 2020-05-10 DIAGNOSIS — M21622 Bunionette of left foot: Secondary | ICD-10-CM | POA: Diagnosis not present

## 2020-07-05 DIAGNOSIS — L930 Discoid lupus erythematosus: Secondary | ICD-10-CM | POA: Diagnosis not present

## 2020-07-05 DIAGNOSIS — M25551 Pain in right hip: Secondary | ICD-10-CM | POA: Diagnosis not present

## 2020-07-05 DIAGNOSIS — M2550 Pain in unspecified joint: Secondary | ICD-10-CM | POA: Diagnosis not present

## 2020-07-05 DIAGNOSIS — D8989 Other specified disorders involving the immune mechanism, not elsewhere classified: Secondary | ICD-10-CM | POA: Diagnosis not present

## 2020-07-20 DIAGNOSIS — G43719 Chronic migraine without aura, intractable, without status migrainosus: Secondary | ICD-10-CM | POA: Diagnosis not present

## 2020-07-20 DIAGNOSIS — G43019 Migraine without aura, intractable, without status migrainosus: Secondary | ICD-10-CM | POA: Diagnosis not present

## 2020-08-01 DIAGNOSIS — R898 Other abnormal findings in specimens from other organs, systems and tissues: Secondary | ICD-10-CM | POA: Diagnosis not present

## 2020-08-01 DIAGNOSIS — E559 Vitamin D deficiency, unspecified: Secondary | ICD-10-CM | POA: Diagnosis not present

## 2020-08-07 DIAGNOSIS — H04123 Dry eye syndrome of bilateral lacrimal glands: Secondary | ICD-10-CM | POA: Diagnosis not present

## 2020-08-08 DIAGNOSIS — L503 Dermatographic urticaria: Secondary | ICD-10-CM | POA: Diagnosis not present

## 2020-08-08 DIAGNOSIS — L723 Sebaceous cyst: Secondary | ICD-10-CM | POA: Diagnosis not present

## 2020-08-08 DIAGNOSIS — R898 Other abnormal findings in specimens from other organs, systems and tissues: Secondary | ICD-10-CM | POA: Diagnosis not present

## 2020-08-15 DIAGNOSIS — L989 Disorder of the skin and subcutaneous tissue, unspecified: Secondary | ICD-10-CM | POA: Diagnosis not present

## 2020-08-15 DIAGNOSIS — L723 Sebaceous cyst: Secondary | ICD-10-CM | POA: Diagnosis not present

## 2020-08-15 DIAGNOSIS — L720 Epidermal cyst: Secondary | ICD-10-CM | POA: Diagnosis not present

## 2020-08-21 DIAGNOSIS — M25562 Pain in left knee: Secondary | ICD-10-CM | POA: Diagnosis not present

## 2020-08-28 DIAGNOSIS — R898 Other abnormal findings in specimens from other organs, systems and tissues: Secondary | ICD-10-CM | POA: Diagnosis not present

## 2020-08-28 DIAGNOSIS — E559 Vitamin D deficiency, unspecified: Secondary | ICD-10-CM | POA: Diagnosis not present

## 2020-08-29 ENCOUNTER — Other Ambulatory Visit: Payer: Self-pay

## 2020-08-29 ENCOUNTER — Ambulatory Visit (HOSPITAL_BASED_OUTPATIENT_CLINIC_OR_DEPARTMENT_OTHER): Payer: BC Managed Care – PPO | Attending: Sports Medicine | Admitting: Physical Therapy

## 2020-08-29 ENCOUNTER — Encounter (HOSPITAL_BASED_OUTPATIENT_CLINIC_OR_DEPARTMENT_OTHER): Payer: Self-pay | Admitting: Physical Therapy

## 2020-08-29 DIAGNOSIS — G8929 Other chronic pain: Secondary | ICD-10-CM | POA: Insufficient documentation

## 2020-08-29 DIAGNOSIS — M25562 Pain in left knee: Secondary | ICD-10-CM | POA: Diagnosis not present

## 2020-08-29 NOTE — Therapy (Signed)
[facility] [address] [place], [place], [address] Phone: [phone]   Fax:  [phone]  Physical Therapy Evaluation  Patient Details  Name: [name] MRN: [id number] Date of Birth: [birth year] Referring Provider (PT): Rodolph Bong, MD   Encounter Date: 08/29/2020   PT End of Session - 08/29/20 1153    Visit Number 1    Number of Visits 13    Date for PT Re-Evaluation 10/23/20    Authorization Type [employer] 30 VL    PT Start Time 1147    PT Stop Time 1230    PT Time Calculation (min) 43 min    Activity Tolerance Patient tolerated treatment well    Behavior During Therapy [id number] for tasks assessed/performed           Past Medical History:  Diagnosis Date  . Broken leg    BROKEN LEG AND HAND AFTER ACCIDENT  . GERD (gastroesophageal reflux disease)   . Hx of atrial fibrillation, no current medication    PAT with pregnancy only-with last 2 pregnancy and currrent-using labetalol prn  . MVP (mitral valve prolapse)    MILD WITH NORMAL LV FX ON ECHO 4/08  . Upper respiratory infection current   finished Z Pak, new prescription for Augmentum to start today 03/04/11  . Von Willebrand disease ([facility])    Hematologist- [facility]-pt states mild and gone during pregnancy    Past Surgical History:  Procedure Laterality Date  . CESAREAN SECTION     3  . CESAREAN SECTION  03/08/2011  . HAND SURGERY  2003  . HERNIA REPAIR    . repair fracture leg  2003  . TONSILLECTOMY     teenager    There were no vitals filed for this visit.    Subjective Assessment - 08/29/20 1154    Subjective About a year Lt knee sharp pain lateral to patella. Most pain in squat. Insidious onset. Discoid lupus and connective tissue inflammation.    Currently in Pain? Yes    Aggravating Factors  ascending stairs- sometimes, squats    Pain Relieving Factors rest              [facility] PT Assessment - 08/29/20 0001      Assessment    Medical Diagnosis LT knee pain    Referring Provider (PT) Rodolph Bong, MD    Onset Date/Surgical Date --   about 1 year   Hand Dominance Right      Precautions   Precaution Comments lupus      Restrictions   Weight Bearing Restrictions No      Balance Screen   Has the patient fallen in the past 6 months No      Home Environment   Living Environment Private residence    Additional Comments stairs in home      Prior Function   Level of Independence Independent      Cognition   Overall Cognitive Status Within Functional Limits for tasks assessed      Sensation   Additional Comments [facility] [address]      Posture/Postural Control   Posture Comments Lt ASIS elevation in standing, resolved ins upine      ROM / Strength   AROM / PROM / Strength PROM      PROM   PROM Assessment Site Hip    Right/Left Hip Right;Left    Right Hip External Rotation  70    Right Hip Internal Rotation  30  Left Hip External Rotation  62    Left Hip Internal Rotation  56      Palpation   Patella mobility Lt medial tracking                      Objective measurements completed on examination: See above findings.       [facility] Adult PT Treatment/Exercise - 08/29/20 0001      Exercises   Exercises Knee/Hip      Knee/Hip Exercises: Standing   Functional Squat Limitations wall & free- cues to stay out of pain range      Knee/Hip Exercises: Seated   Other Seated Knee/Hip Exercises supine & seated positions to train TrA contraction      Manual Therapy   Manual Therapy Taping    McConnell Lt knee med to lat pull                  PT Education - 08/29/20 1755    Education Details anatomy of condition, POC, HEP, exercise formr/ationale    Person(s) Educated Patient    Methods Explanation;Demonstration;Tactile cues;Verbal cues;Handout    Comprehension Verbalized understanding;Returned demonstration;Verbal cues required;Tactile cues required;Need further instruction             PT Short Term Goals - 08/29/20 2029      PT SHORT TERM GOAL #1   Title able to demo squat with neutral pelvic alignment    Baseline Lt hip shifts back    Time 3    Period Weeks    Status New    Target Date 09/22/20             PT Long Term Goals - 08/29/20 1802      PT LONG TERM GOAL #1   Title Able to climb stairs without pain    Baseline inconsistent but limiting pain    Time 6    Period Weeks    Status New    Target Date 10/13/20      PT LONG TERM GOAL #2   Title Will be able to perform squats in exercise classes without knee pain    Baseline unable to squat past approx 40 deg without pain at eval    Time 6    Period Weeks    Status New    Target Date 10/13/20      PT LONG TERM GOAL #3   Title pt will be independent with long term core program for continued strengthening    Baseline significant difficulty creating coordinated contractio at eval    Time 6    Period Weeks    Status New    Target Date 10/13/20                  Plan - 08/29/20 1800    Clinical Impression Statement Pt presents to PT with complaints of Lt knee pain, began about 1 year ago of insidious onset. She is very active and has good strength. H/o 3 c-sections and hernia repair has resulted in very poor core control. noted that when squatting (main aggravating factor) her Lt hip travels post resulting in medial pull on patella from quads. Decr pain with hold to improve midline travel so mcconnell tape was placed to encourage lateral motion and asked her to keep squats high and out of painful range for training. Core stability will require further training to improve proximal control to distal mobiility.    Personal Factors and Comorbidities Comorbidity 2  Comorbidities lupus, C-sectionx3, hernia repair    Examination-Activity Limitations Squat;Stairs;Bend;Stand    Examination-Participation Restrictions Community Activity;Occupation    Stability/Clinical Decision Making  Stable/Uncomplicated    Clinical Decision Making Low    Rehab Potential Good    PT Frequency 2x / week    PT Duration 6 weeks    PT Treatment/Interventions ADLs/Self Care Home Management;Aquatic Therapy;Cryotherapy;Electrical Stimulation;Iontophoresis 4mg /ml Dexamethasone;Moist Heat;Ultrasound;Stair training;Gait training;Therapeutic activities;Therapeutic exercise;Neuromuscular re-education;Patient/family education;Manual techniques;Taping;Dry needling;Passive range of motion    PT Next Visit Plan outcome of taping? continue core work    PT Home Exercise Plan ab set, high squats    Consulted and Agree with Plan of Care Patient           Patient will benefit from skilled therapeutic intervention in order to improve the following deficits and impairments:  Improper body mechanics,Decreased activity tolerance,Pain  Visit Diagnosis: Chronic pain of left knee - Plan: PT plan of care cert/re-cert     Problem List Patient Active Problem List   Diagnosis Date Noted  . Acute sinusitis 11/12/2019  . 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 Epidermoid cyst of skin 05/19/2018  . Hyperbilirubinemia 07/30/2017  . Routine general medical examination at a health care facility 04/07/2017  . Ulnar neuritis, right 11/23/2016  . Migraine 09/25/2016  . Von Willebrand disease ([facility]) 11/03/2010  . Atrial fibrillation ([facility]) 06/15/2008    Aneesah Hernan C. Dontez Hauss PT, DPT 08/29/20 8:47 PM   [facility] [address] [place], [place], [place] Phone: [phone]   Fax:  [phone]  Name: [name] MRN: [name] Date of Birth: 04-26-1976

## 2020-09-04 ENCOUNTER — Ambulatory Visit (HOSPITAL_BASED_OUTPATIENT_CLINIC_OR_DEPARTMENT_OTHER): Payer: BC Managed Care – PPO | Attending: Sports Medicine | Admitting: Physical Therapy

## 2020-09-04 ENCOUNTER — Encounter (HOSPITAL_BASED_OUTPATIENT_CLINIC_OR_DEPARTMENT_OTHER): Payer: Self-pay | Admitting: Physical Therapy

## 2020-09-04 ENCOUNTER — Other Ambulatory Visit: Payer: Self-pay

## 2020-09-04 DIAGNOSIS — M25562 Pain in left knee: Secondary | ICD-10-CM | POA: Insufficient documentation

## 2020-09-04 DIAGNOSIS — G8929 Other chronic pain: Secondary | ICD-10-CM | POA: Diagnosis not present

## 2020-09-04 NOTE — Therapy (Signed)
[facility] [address] [place], [place], [address] Phone: [phone]   Fax:  [phone]  Physical Therapy Treatment  Patient Details  Name: [name] MRN: [id number] Date of Birth: 01/13/1976 Referring Provider (PT): Rodolph Bong, MD   Encounter Date: 09/04/2020   PT End of Session - 09/04/20 0814    Visit Number 2    Number of Visits 13    Date for PT Re-Evaluation 10/23/20    Authorization Type BCBS 30 VL    PT Start Time 0800    PT Stop Time 0842    PT Time Calculation (min) 42 min    Activity Tolerance Patient tolerated treatment well    Behavior During Therapy [facility] for tasks assessed/performed           Past Medical History:  Diagnosis Date  . Broken leg    BROKEN LEG AND HAND AFTER ACCIDENT  . GERD (gastroesophageal reflux disease)   . Hx of atrial fibrillation, no current medication    PAT with pregnancy only-with last 2 pregnancy and currrent-using labetalol prn  . MVP (mitral valve prolapse)    MILD WITH NORMAL LV FX ON ECHO 4/08  . Upper respiratory infection current   finished Z Pak, new prescription for Augmentum to start today 03/04/11  . Von Willebrand disease (HCC)    Hematologist- [facility]-pt states mild and gone during pregnancy    Past Surgical History:  Procedure Laterality Date  . CESAREAN SECTION     3  . CESAREAN SECTION  03/08/2011  . HAND SURGERY  2003  . HERNIA REPAIR    . repair fracture leg  2003  . TONSILLECTOMY     teenager    There were no vitals filed for this visit.   Subjective Assessment - 09/04/20 0800    Subjective Tape came off Sat.    Currently in Pain? No/denies              [facility] PT Assessment - 09/04/20 0001      ROM / Strength   AROM / PROM / Strength Strength      Strength   Overall Strength Comments bil hip abd 4/5                         [facility] Adult PT Treatment/Exercise - 09/04/20 0001      Knee/Hip  Exercises: Standing   Other Standing Knee Exercises squat bar pull off- about 15 until pain began    Other Standing Knee Exercises narrow squat- gait belt around knees      Knee/Hip Exercises: Supine   Bridges with Ball Squeeze Both;Strengthening   ball bw knees- cues to level hips- tends to drop Lt   Straight Leg Raises Limitations both legs ext- working to keep pelvis flat, bar for pelvic fcue    Other Supine Knee/Hip Exercises TT ball bw knees      Knee/Hip Exercises: Sidelying   Hip ABduction Limitations hip abd+long axis flexion 2lb    Other Sidelying Knee/Hip Exercises hip flexed to 90, knee ext, lift from table      Manual Therapy   Manual therapy comments IASTM Lt ITB, VL                    PT Short Term Goals - 08/29/20 2029      PT SHORT TERM GOAL #1   Title able to demo squat with neutral pelvic alignment  Baseline Lt hip shifts back    Time 3    Period Weeks    Status New    Target Date 09/22/20             PT Long Term Goals - 08/29/20 1802      PT LONG TERM GOAL #1   Title Able to climb stairs without pain    Baseline inconsistent but limiting pain    Time 6    Period Weeks    Status New    Target Date 10/13/20      PT LONG TERM GOAL #2   Title Will be able to perform squats in exercise classes without knee pain    Baseline unable to squat past approx 40 deg without pain at eval    Time 6    Period Weeks    Status New    Target Date 10/13/20      PT LONG TERM GOAL #3   Title pt will be independent with long term core program for continued strengthening    Baseline significant difficulty creating coordinated contractio at eval    Time 6    Period Weeks    Status New    Target Date 10/13/20                 Plan - 09/04/20 0843    Clinical Impression Statement Tightness noted in ITB with h/o ITB irritation and plantar fasciitis that is not ongoing. Noted inability to maintain neutral pelvis in SLR indicating lack of core  control. PT applied pressure for asssist and moved to towel roll under Lt hip and bar across hips for visual cues. Added strength/coordination exercises for core/hip abd groups for distal support. Noted decreased pain in narrow squat with isometric press- asked her to perform deep squats in workout but stay narrow unless doing sumo squats.    PT Treatment/Interventions ADLs/Self Care Home Management;Aquatic Therapy;Cryotherapy;Electrical Stimulation;Iontophoresis 4mg /ml Dexamethasone;Moist Heat;Ultrasound;Stair training;Gait training;Therapeutic activities;Therapeutic exercise;Neuromuscular re-education;Patient/family education;Manual techniques;Taping;Dry needling;Passive range of motion    PT Next Visit Plan cont hip abd/core work. how did narrow squats go?    PT Home Exercise Plan [id number]    Consulted and Agree with Plan of Care Patient           Patient will benefit from skilled therapeutic intervention in order to improve the following deficits and impairments:  Improper body mechanics,Decreased activity tolerance,Pain  Visit Diagnosis: Chronic pain of left knee     Problem List Patient Active Problem List   Diagnosis Date Noted  . Acute sinusitis 11/12/2019  . 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 Epidermoid cyst of skin 05/19/2018  . Hyperbilirubinemia 07/30/2017  . Routine general medical examination at a health care facility 04/07/2017  . Ulnar neuritis, right 11/23/2016  . Migraine 09/25/2016  . Von Willebrand disease (HCC) 11/03/2010  . Atrial fibrillation (HCC) 06/15/2008    Levent Kornegay C. Nicolaas Savo PT, DPT 09/04/20 12:10 PM   [facility] [address] [place], [place], [place] Phone: [phone]   Fax:  [phone]  Name: [name] MRN: [name] Date of Birth: November 29, 1975

## 2020-09-06 ENCOUNTER — Other Ambulatory Visit: Payer: Self-pay

## 2020-09-06 ENCOUNTER — Ambulatory Visit (HOSPITAL_BASED_OUTPATIENT_CLINIC_OR_DEPARTMENT_OTHER): Payer: BC Managed Care – PPO | Admitting: Physical Therapy

## 2020-09-06 DIAGNOSIS — G8929 Other chronic pain: Secondary | ICD-10-CM | POA: Diagnosis not present

## 2020-09-06 DIAGNOSIS — M25562 Pain in left knee: Secondary | ICD-10-CM | POA: Diagnosis not present

## 2020-09-06 NOTE — Therapy (Signed)
[facility] [address] [place], [place], [address] Phone: [phone]   Fax:  [phone]  Physical Therapy Treatment  Patient Details  Name: [name] MRN: [id number] Date of Birth: 06-19-1975 Referring Provider (PT): Rodolph Bong, MD   Encounter Date: 09/06/2020   PT End of Session - 09/06/20 0841    Visit Number 3    Number of Visits 13    Date for PT Re-Evaluation 10/23/20    Authorization Type BCBS 30 VL    PT Start Time 0800    PT Stop Time 0841    PT Time Calculation (min) 41 min    Activity Tolerance Patient tolerated treatment well    Behavior During Therapy [facility] for tasks assessed/performed           Past Medical History:  Diagnosis Date  . Broken leg    BROKEN LEG AND HAND AFTER ACCIDENT  . GERD (gastroesophageal reflux disease)   . Hx of atrial fibrillation, no current medication    PAT with pregnancy only-with last 2 pregnancy and currrent-using labetalol prn  . MVP (mitral valve prolapse)    MILD WITH NORMAL LV FX ON ECHO 4/08  . Upper respiratory infection current   finished Z Pak, new prescription for Augmentum to start today 03/04/11  . Von Willebrand disease (HCC)    Hematologist- [facility]-pt states mild and gone during pregnancy    Past Surgical History:  Procedure Laterality Date  . CESAREAN SECTION     3  . CESAREAN SECTION  03/08/2011  . HAND SURGERY  2003  . HERNIA REPAIR    . repair fracture leg  2003  . TONSILLECTOMY     teenager    There were no vitals filed for this visit.                      [facility] Adult PT Treatment/Exercise - 09/06/20 0001      Knee/Hip Exercises: Standing   Step Down Limitations fwd heel taps- mirror    Other Standing Knee Exercises single leg hinge    Other Standing Knee Exercises bosu squats- high squat without pain, 10s holds      Knee/Hip Exercises: Supine   Straight Leg Raises Limitations Rt leg slghtly  flexed    Other Supine Knee/Hip Exercises bridge with march- bar on hips    Other Supine Knee/Hip Exercises single leg bridge- foot up to ceiling                    PT Short Term Goals - 08/29/20 2029      PT SHORT TERM GOAL #1   Title able to demo squat with neutral pelvic alignment    Baseline Lt hip shifts back    Time 3    Period Weeks    Status New    Target Date 09/22/20             PT Long Term Goals - 08/29/20 1802      PT LONG TERM GOAL #1   Title Able to climb stairs without pain    Baseline inconsistent but limiting pain    Time 6    Period Weeks    Status New    Target Date 10/13/20      PT LONG TERM GOAL #2   Title Will be able to perform squats in exercise classes without knee pain    Baseline unable to squat past approx 40  deg without pain at eval    Time 6    Period Weeks    Status New    Target Date 10/13/20      PT LONG TERM GOAL #3   Title pt will be independent with long term core program for continued strengthening    Baseline significant difficulty creating coordinated contractio at eval    Time 6    Period Weeks    Status New    Target Date 10/13/20                 Plan - 09/06/20 0820    Clinical Impression Statement pain in squat at 60 deg on ground and bosu with pain- slow to control knee tracking was able to go to 85 deg knee flexion consistently without pain. cont difficulty maintaing level pelvis in supine SLR- less assist required for pressure to ASIS- able to use single hand under Lt hip OR PT pressure to Rt innominate rather than both today. added single leg bridges to HEP for training of core control.    PT Treatment/Interventions ADLs/Self Care Home Management;Aquatic Therapy;Cryotherapy;Electrical Stimulation;Iontophoresis 4mg /ml Dexamethasone;Moist Heat;Ultrasound;Stair training;Gait training;Therapeutic activities;Therapeutic exercise;Neuromuscular re-education;Patient/family education;Manual  techniques;Taping;Dry needling;Passive range of motion    PT Next Visit Plan review single leg squats- unstable surfaces, recheck control    PT Home Exercise Plan [email or web address]    Consulted and Agree with Plan of Care Patient           Patient will benefit from skilled therapeutic intervention in order to improve the following deficits and impairments:  Improper body mechanics,Decreased activity tolerance,Pain  Visit Diagnosis: Chronic pain of left knee     Problem List Patient Active Problem List   Diagnosis Date Noted  . Acute sinusitis 11/12/2019  . 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 Epidermoid cyst of skin 05/19/2018  . Hyperbilirubinemia 07/30/2017  . Routine general medical examination at a health care facility 04/07/2017  . Ulnar neuritis, right 11/23/2016  . Migraine 09/25/2016  . Von Willebrand disease (HCC) 11/03/2010  . Atrial fibrillation (HCC) 06/15/2008   Clorine Swing C. Felecia Stanfill PT, DPT 09/06/20 9:43 AM   [facility] [address] [place], [place], [place] Phone: [phone]   Fax:  [phone]  Name: [name] MRN: [name] Date of Birth: 16-Nov-1975

## 2020-09-08 ENCOUNTER — Ambulatory Visit (HOSPITAL_BASED_OUTPATIENT_CLINIC_OR_DEPARTMENT_OTHER): Payer: BC Managed Care – PPO | Admitting: Physical Therapy

## 2020-09-13 ENCOUNTER — Other Ambulatory Visit: Payer: Self-pay

## 2020-09-13 ENCOUNTER — Encounter (HOSPITAL_BASED_OUTPATIENT_CLINIC_OR_DEPARTMENT_OTHER): Payer: Self-pay | Admitting: Physical Therapy

## 2020-09-13 ENCOUNTER — Ambulatory Visit (HOSPITAL_BASED_OUTPATIENT_CLINIC_OR_DEPARTMENT_OTHER): Payer: BC Managed Care – PPO | Admitting: Physical Therapy

## 2020-09-13 DIAGNOSIS — G8929 Other chronic pain: Secondary | ICD-10-CM | POA: Diagnosis not present

## 2020-09-13 DIAGNOSIS — M25562 Pain in left knee: Secondary | ICD-10-CM | POA: Diagnosis not present

## 2020-09-13 NOTE — Therapy (Addendum)
[facility] [address] [place], [place], [address] Phone: [phone]   Fax:  [phone]  Physical Therapy Treatment  Patient Details  Name: [name] MRN: [id number] Date of Birth: Feb 28, 1976 Referring Provider (PT): Rodolph Bong, MD   Encounter Date: 09/13/2020   PT End of Session - 09/13/20 0805    Visit Number 4    Number of Visits 13    Date for PT Re-Evaluation 10/23/20    Authorization Type BCBS 30 VL    PT Start Time 0801    PT Stop Time 0844    PT Time Calculation (min) 43 min    Activity Tolerance Patient tolerated treatment well    Behavior During Therapy [facility] for tasks assessed/performed           Past Medical History:  Diagnosis Date  . Broken leg    BROKEN LEG AND HAND AFTER ACCIDENT  . GERD (gastroesophageal reflux disease)   . Hx of atrial fibrillation, no current medication    PAT with pregnancy only-with last 2 pregnancy and currrent-using labetalol prn  . MVP (mitral valve prolapse)    MILD WITH NORMAL LV FX ON ECHO 4/08  . Upper respiratory infection current   finished Z Pak, new prescription for Augmentum to start today 03/04/11  . Von Willebrand disease (HCC)    Hematologist- [facility]-pt states mild and gone during pregnancy    Past Surgical History:  Procedure Laterality Date  . CESAREAN SECTION     3  . CESAREAN SECTION  03/08/2011  . HAND SURGERY  2003  . HERNIA REPAIR    . repair fracture leg  2003  . TONSILLECTOMY     teenager    There were no vitals filed for this visit.   Subjective Assessment - 09/13/20 0805    Subjective did slower squats which helped but still brought on pain    Currently in Pain? No/denies                             [facility] Adult PT Treatment/Exercise - 09/13/20 0001      Knee/Hip Exercises: Standing   SLS on airex & +10 SL squats each    Walking with Sports Cord green around knees, lateral stepping     Other Standing Knee Exercises bosu squats- pain on 3rd squat      Manual Therapy   Manual therapy comments fat pad mobility    McConnell replaced ktape for fat pad support                    PT Short Term Goals - 08/29/20 2029      PT SHORT TERM GOAL #1   Title able to demo squat with neutral pelvic alignment    Baseline Lt hip shifts back    Time 3    Period Weeks    Status New    Target Date 09/22/20             PT Long Term Goals - 08/29/20 1802      PT LONG TERM GOAL #1   Title Able to climb stairs without pain    Baseline inconsistent but limiting pain    Time 6    Period Weeks    Status New    Target Date 10/13/20      PT LONG TERM GOAL #2   Title Will be able to perform squats  in exercise classes without knee pain    Baseline unable to squat past approx 40 deg without pain at eval    Time 6    Period Weeks    Status New    Target Date 10/13/20      PT LONG TERM GOAL #3   Title pt will be independent with long term core program for continued strengthening    Baseline significant difficulty creating coordinated contractio at eval    Time 6    Period Weeks    Status New    Target Date 10/13/20                 Plan - 09/13/20 0854    Clinical Impression Statement pain brought on with unstable squats on bosu today and resolved following patellar fat pad mobility. ktape was not strong enough and began having pain after lateral squat walks- pain resolved with more fat pad mobility and placement of mcconnell tape. will cont with narrow, slower squats and will pay attn to time in class before pain begins. supine, left SLR still results in lifting of Rt pelvis but is improving.    PT Treatment/Interventions ADLs/Self Care Home Management;Aquatic Therapy;Cryotherapy;Electrical Stimulation;Iontophoresis 4mg /ml Dexamethasone;Moist Heat;Ultrasound;Stair training;Gait training;Therapeutic activities;Therapeutic exercise;Neuromuscular  re-education;Patient/family education;Manual techniques;Taping;Dry needling;Passive range of motion    PT Next Visit Plan review single leg squats- unstable surfaces, recheck control    PT Home Exercise Plan [id number]    Consulted and Agree with Plan of Care Patient           Patient will benefit from skilled therapeutic intervention in order to improve the following deficits and impairments:  Improper body mechanics,Decreased activity tolerance,Pain  Visit Diagnosis: Chronic pain of left knee     Problem List Patient Active Problem List   Diagnosis Date Noted  . Acute sinusitis 11/12/2019  . 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 Epidermoid cyst of skin 05/19/2018  . Hyperbilirubinemia 07/30/2017  . Routine general medical examination at a health care facility 04/07/2017  . Ulnar neuritis, right 11/23/2016  . Migraine 09/25/2016  . Von Willebrand disease (HCC) 11/03/2010  . Atrial fibrillation (HCC) 06/15/2008    Leeona Mccardle C. Raney Antwine PT, DPT 09/13/20 8:57 AM   [facility] [address] [place], [place], [place] Phone: [phone]   Fax:  [phone]  Name: ELANTRA CAPRARA MRN: [name] Date of Birth: 1975/11/21

## 2020-09-20 ENCOUNTER — Other Ambulatory Visit: Payer: Self-pay

## 2020-09-20 ENCOUNTER — Ambulatory Visit (HOSPITAL_BASED_OUTPATIENT_CLINIC_OR_DEPARTMENT_OTHER): Payer: BC Managed Care – PPO | Admitting: Physical Therapy

## 2020-09-20 ENCOUNTER — Encounter (HOSPITAL_BASED_OUTPATIENT_CLINIC_OR_DEPARTMENT_OTHER): Payer: Self-pay | Admitting: Physical Therapy

## 2020-09-20 DIAGNOSIS — G8929 Other chronic pain: Secondary | ICD-10-CM | POA: Diagnosis not present

## 2020-09-20 DIAGNOSIS — M25562 Pain in left knee: Secondary | ICD-10-CM | POA: Diagnosis not present

## 2020-09-20 NOTE — Therapy (Signed)
[facility] [address] [place], [place], [address] Phone: [phone]   Fax:  [phone]  Physical Therapy Treatment  Patient Details  Name: [name] MRN: [id number] Date of Birth: 1976/01/20 Referring Provider (PT): Rodolph Bong, MD   Encounter Date: 09/20/2020   PT End of Session - 09/20/20 0802    Visit Number 5    Number of Visits 13    Date for PT Re-Evaluation 10/23/20    Authorization Type BCBS 30 VL    PT Start Time 0800           Past Medical History:  Diagnosis Date  . Broken leg    BROKEN LEG AND HAND AFTER ACCIDENT  . GERD (gastroesophageal reflux disease)   . Hx of atrial fibrillation, no current medication    PAT with pregnancy only-with last 2 pregnancy and currrent-using labetalol prn  . MVP (mitral valve prolapse)    MILD WITH NORMAL LV FX ON ECHO 4/08  . Upper respiratory infection current   finished Z Pak, new prescription for Augmentum to start today 03/04/11  . Von Willebrand disease (HCC)    Hematologist- [facility]-pt states mild and gone during pregnancy    Past Surgical History:  Procedure Laterality Date  . CESAREAN SECTION     3  . CESAREAN SECTION  03/08/2011  . HAND SURGERY  2003  . HERNIA REPAIR    . repair fracture leg  2003  . TONSILLECTOMY     teenager    There were no vitals filed for this visit.       [facility] PT Assessment - 09/20/20 0001      Strength   Overall Strength Comments bil hip abd 4+/5                         [facility] Adult PT Treatment/Exercise - 09/20/20 0001      Knee/Hip Exercises: Standing   Lateral Step Up Limitations step down to tap floor 2"    Forward Step Up Step Height: 4"    Forward Step Up Limitations fwd tap table    SLS single leg squat 2lb in opp hand    Other Standing Knee Exercises fitter & bosu balance & squats      Knee/Hip Exercises: Supine   Other Supine Knee/Hip Exercises SLR & leg lowers  neutral & ER with level pelvis      Knee/Hip Exercises: Sidelying   Other Sidelying Knee/Hip Exercises arcs                    PT Short Term Goals - 09/20/20 0840      PT SHORT TERM GOAL #1   Title able to demo squat with neutral pelvic alignment    Status Achieved             PT Long Term Goals - 08/29/20 1802      PT LONG TERM GOAL #1   Title Able to climb stairs without pain    Baseline inconsistent but limiting pain    Time 6    Period Weeks    Status New    Target Date 10/13/20      PT LONG TERM GOAL #2   Title Will be able to perform squats in exercise classes without knee pain    Baseline unable to squat past approx 40 deg without pain at eval    Time 6    Period  Weeks    Status New    Target Date 10/13/20      PT LONG TERM GOAL #3   Title pt will be independent with long term core program for continued strengthening    Baseline significant difficulty creating coordinated contractio at eval    Time 6    Period Weeks    Status New    Target Date 10/13/20                 Plan - 09/20/20 0840    Clinical Impression Statement Knee is becoming less irritable and able to peroform more double and single leg squats on stable surfaces before knee hurts (previously only took about 3 squats to make it hurt). Fitter and Bosu unstable surfaces still bring on sharp pains quickly. able to maintain neutral pelvis in SLR (very slight raise of Rt hip with Lt SLR) so it was progressed to single leg lower from 90 deg in neutral and turnout.    PT Treatment/Interventions ADLs/Self Care Home Management;Aquatic Therapy;Cryotherapy;Electrical Stimulation;Iontophoresis 4mg /ml Dexamethasone;Moist Heat;Ultrasound;Stair training;Gait training;Therapeutic activities;Therapeutic exercise;Neuromuscular re-education;Patient/family education;Manual techniques;Taping;Dry needling;Passive range of motion    PT Next Visit Plan cont to trial unstable surfaces, check plank form-  add challenges if appropriate    PT Home Exercise Plan [email or web address]    Consulted and Agree with Plan of Care Patient           Patient will benefit from skilled therapeutic intervention in order to improve the following deficits and impairments:  Improper body mechanics,Decreased activity tolerance,Pain  Visit Diagnosis: Chronic pain of left knee     Problem List Patient Active Problem List   Diagnosis Date Noted  . Acute sinusitis 11/12/2019  . 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 Epidermoid cyst of skin 05/19/2018  . Hyperbilirubinemia 07/30/2017  . Routine general medical examination at a health care facility 04/07/2017  . Ulnar neuritis, right 11/23/2016  . Migraine 09/25/2016  . Von Willebrand disease (HCC) 11/03/2010  . Atrial fibrillation (HCC) 06/15/2008    Jc Veron C. Angelice Piech PT, DPT 09/20/20 8:43 AM   [facility] [address] [place], [place], [place] Phone: [phone]   Fax:  [phone]  Name: [name] MRN: [name] Date of Birth: 04-May-1976

## 2020-09-26 ENCOUNTER — Ambulatory Visit (HOSPITAL_BASED_OUTPATIENT_CLINIC_OR_DEPARTMENT_OTHER): Payer: BC Managed Care – PPO | Admitting: Physical Therapy

## 2020-09-26 ENCOUNTER — Encounter (HOSPITAL_BASED_OUTPATIENT_CLINIC_OR_DEPARTMENT_OTHER): Payer: Self-pay | Admitting: Physical Therapy

## 2020-09-26 ENCOUNTER — Other Ambulatory Visit: Payer: Self-pay

## 2020-09-26 DIAGNOSIS — G8929 Other chronic pain: Secondary | ICD-10-CM

## 2020-09-26 DIAGNOSIS — M25562 Pain in left knee: Secondary | ICD-10-CM | POA: Diagnosis not present
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
## 2020-09-29 ENCOUNTER — Ambulatory Visit (HOSPITAL_BASED_OUTPATIENT_CLINIC_OR_DEPARTMENT_OTHER): Payer: BC Managed Care – PPO | Admitting: Physical Therapy

## 2020-09-29 ENCOUNTER — Other Ambulatory Visit: Payer: Self-pay

## 2020-09-29 ENCOUNTER — Encounter (HOSPITAL_BASED_OUTPATIENT_CLINIC_OR_DEPARTMENT_OTHER): Payer: Self-pay | Admitting: Physical Therapy

## 2020-09-29 DIAGNOSIS — G8929 Other chronic pain: Secondary | ICD-10-CM | POA: Diagnosis not present

## 2020-09-29 DIAGNOSIS — M25562 Pain in left knee: Secondary | ICD-10-CM
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
## 2020-10-02 ENCOUNTER — Ambulatory Visit (INDEPENDENT_AMBULATORY_CARE_PROVIDER_SITE_OTHER): Payer: BC Managed Care – PPO

## 2020-10-02 ENCOUNTER — Ambulatory Visit (HOSPITAL_COMMUNITY): Payer: Self-pay

## 2020-10-02 ENCOUNTER — Ambulatory Visit (HOSPITAL_COMMUNITY)
Admission: EM | Admit: 2020-10-02 | Discharge: 2020-10-02 | Disposition: A | Discharge status: Home / Self Care | Payer: BC Managed Care – PPO | Attending: Family Medicine | Admitting: Family Medicine

## 2020-10-02 ENCOUNTER — Encounter (HOSPITAL_COMMUNITY): Payer: Self-pay | Admitting: Emergency Medicine

## 2020-10-02 ENCOUNTER — Other Ambulatory Visit: Payer: Self-pay

## 2020-10-02 DIAGNOSIS — U071 COVID-19: Secondary | ICD-10-CM | POA: Diagnosis not present

## 2020-10-02 DIAGNOSIS — R059 Cough, unspecified: Secondary | ICD-10-CM | POA: Diagnosis not present

## 2020-10-02 DIAGNOSIS — R0789 Other chest pain: Secondary | ICD-10-CM

## 2020-10-02 DIAGNOSIS — R079 Chest pain, unspecified: Secondary | ICD-10-CM | POA: Diagnosis not present

## 2020-10-02 DIAGNOSIS — R509 Fever, unspecified: Secondary | ICD-10-CM | POA: Diagnosis not present

## 2020-10-02 MED ORDER — PAXLOVID 20 X 150 MG & 10 X 100MG PO TBPK: 3.0000 | ORAL_TABLET | Freq: Two times a day (BID) | ORAL | 0 refills | Status: AC

## 2020-10-02 MED ORDER — ALBUTEROL SULFATE HFA 108 (90 BASE) MCG/ACT IN AERS: 1.0000 | INHALATION_SPRAY | Freq: Four times a day (QID) | RESPIRATORY_TRACT | 0 refills | Status: DC | PRN

## 2020-10-02 NOTE — ED Triage Notes (Signed)
Pt presents today with c/o cough, chest pain described as tightness, headache and chest pain. +fever of 102.0 at home. She reports getting second booster last Friday and began symptoms on Saturday. She took home Covid test yesterday and was positive. She called PCP who told her to come here for chest xray.
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
## 2020-10-03 ENCOUNTER — Ambulatory Visit (HOSPITAL_BASED_OUTPATIENT_CLINIC_OR_DEPARTMENT_OTHER): Payer: BC Managed Care – PPO | Admitting: Physical Therapy

## 2020-10-03 ENCOUNTER — Telehealth: Payer: Self-pay | Admitting: Internal Medicine

## 2020-10-03 NOTE — Telephone Encounter (Signed)
[facility] FYI:  --Caller states she tested positive for COVID-19 and she has a cough and heaviness in her chest. She has hx of lupus and received the vaccine on Friday.  Advised to go to ED or PCP

## 2020-10-03 NOTE — Telephone Encounter (Signed)
After chart review. Pt was seen at the ED yesterday for this.

## 2020-10-03 NOTE — Telephone Encounter (Signed)
Yes, she needs to be seen Are we allowed to see her here?

## 2020-10-06 ENCOUNTER — Ambulatory Visit (HOSPITAL_BASED_OUTPATIENT_CLINIC_OR_DEPARTMENT_OTHER): Payer: BC Managed Care – PPO | Admitting: Physical Therapy

## 2020-10-24 DIAGNOSIS — M25562 Pain in left knee: Secondary | ICD-10-CM | POA: Diagnosis not present

## 2020-10-27 ENCOUNTER — Encounter (HOSPITAL_COMMUNITY): Payer: Self-pay

## 2020-10-27 ENCOUNTER — Emergency Department (HOSPITAL_COMMUNITY): Payer: BC Managed Care – PPO

## 2020-10-27 ENCOUNTER — Ambulatory Visit (HOSPITAL_COMMUNITY)
Admission: EM | Admit: 2020-10-27 | Discharge: 2020-10-27 | Disposition: A | Discharge status: Home / Self Care | Payer: BC Managed Care – PPO | Attending: Urgent Care | Admitting: Urgent Care

## 2020-10-27 ENCOUNTER — Other Ambulatory Visit: Payer: Self-pay

## 2020-10-27 ENCOUNTER — Emergency Department (HOSPITAL_COMMUNITY)
Admission: EM | Admit: 2020-10-27 | Discharge: 2020-10-27 | Disposition: A | Discharge status: Home / Self Care | Payer: BC Managed Care – PPO | Attending: Emergency Medicine | Admitting: Emergency Medicine

## 2020-10-27 DIAGNOSIS — R079 Chest pain, unspecified: Secondary | ICD-10-CM | POA: Diagnosis not present

## 2020-10-27 DIAGNOSIS — R0789 Other chest pain: Secondary | ICD-10-CM | POA: Diagnosis not present

## 2020-10-27 DIAGNOSIS — R0602 Shortness of breath: Secondary | ICD-10-CM | POA: Diagnosis not present

## 2020-10-27 LAB — CBC
HCT: 41.9 % (ref 36.0–46.0)
Hemoglobin: 13.8 g/dL (ref 12.0–15.0)
MCH: 31.9 pg (ref 26.0–34.0)
MCHC: 32.9 g/dL (ref 30.0–36.0)
MCV: 97 fL (ref 80.0–100.0)
Platelets: 189 10*3/uL (ref 150–400)
RBC: 4.32 MIL/uL (ref 3.87–5.11)
RDW: 12.2 % (ref 11.5–15.5)
WBC: 5.4 10*3/uL (ref 4.0–10.5)
nRBC: 0 % (ref 0.0–0.2)

## 2020-10-27 LAB — COMPREHENSIVE METABOLIC PANEL
ALT: 21 U/L (ref 0–44)
AST: 18 U/L (ref 15–41)
Albumin: 3.7 g/dL (ref 3.5–5.0)
Alkaline Phosphatase: 28 U/L — ABNORMAL LOW (ref 38–126)
Anion gap: 8 (ref 5–15)
BUN: 26 mg/dL — ABNORMAL HIGH (ref 6–20)
CO2: 26 mmol/L (ref 22–32)
Calcium: 9.4 mg/dL (ref 8.9–10.3)
Chloride: 104 mmol/L (ref 98–111)
Creatinine, Ser: 0.7 mg/dL (ref 0.44–1.00)
GFR, Estimated: >60 mL/min (ref >60)
Glucose, Bld: 88 mg/dL (ref 70–99)
Potassium: 3.7 mmol/L (ref 3.5–5.1)
Sodium: 138 mmol/L (ref 135–145)
Total Bilirubin: 0.7 mg/dL (ref 0.3–1.2)
Total Protein: 7 g/dL (ref 6.5–8.1)

## 2020-10-27 LAB — D-DIMER, QUANTITATIVE: D-Dimer, Quant: 0.46 ug/mL-FEU (ref 0.00–0.50)

## 2020-10-27 LAB — TROPONIN I (HIGH SENSITIVITY)
Troponin I (High Sensitivity): 2 ng/L (ref <18)
Troponin I (High Sensitivity): 4 ng/L (ref <18)

## 2020-10-27 LAB — I-STAT BETA HCG BLOOD, ED (MC, WL, AP ONLY): I-stat hCG, quantitative: <5 m[IU]/mL (ref <5)

## 2020-10-27 LAB — LIPASE, BLOOD: Lipase: 66 U/L — ABNORMAL HIGH (ref 11–51)
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
## 2020-10-27 NOTE — Discharge Instructions (Addendum)
Follow-up with your primary care doctor regarding your episode of chest pain.  If you develop worsening chest pain, difficulty in breathing, episodes of passing out or other new concerning symptom, come back to ER for reassessment.

## 2020-10-27 NOTE — ED Triage Notes (Signed)
Pt c/o CP since 1300, states has gotten progressively worse throughout the day. Pain increases w inspiration, putting pressure on her chest relieves pain. States she was lightheaded earlier, has since resolved. Pain shoots through to back, down neck/arm.  6/10 pressure, discomfort, sharp w inspiration

## 2020-10-27 NOTE — ED Provider Notes (Signed)
Emergency Medicine Provider Triage Evaluation Note  [name] , a 45 y.o. female  was evaluated in triage.  Pt complains of left-sided chest pain radiating to the towards left side of neck and left arm.  Patient describes the chest pain as a pressure.  It began at 1 PM this afternoon and has been getting progressively worse.  She states that she had been feeling perfectly fine prior to onset of symptoms.  She states that is associated with lightheadedness, but denies any syncope.  No associated nausea, shortness of breath, palpitations, or other symptoms.  No obvious aggravating or alleviating factors.  She describes that 6 out of 10 pain.  She had ignored it hoping it would go away, but it continued to persist prompting her to go to an urgent care who subsequently referred her here to the ED for ongoing evaluation and management.  She was COVID-19 positive approximately 4 weeks ago, but has since been hiking and active.  No history of clots or clotting disorder.  Hx Von Willebrand disease, MVP.  Review of Systems  Positive: Left-sided chest pain, lightheadedness Negative: SOB, nausea, unilateral extremity swelling, cough, hemoptysis  Physical Exam  BP 134/85 (BP Location: Left Arm)   Pulse 62   Temp 98.9 F (37.2 C) (Oral)   Resp 15   SpO2 100%  Gen:   Awake, no distress   Resp:  Normal effort, CTA bilaterally. MSK:   Moves extremities without difficulty  Other:    Medical Decision Making  Medically screening exam initiated at 8:14 PM.  Appropriate orders placed.  Berania KIVA NORLAND was informed that the remainder of the evaluation will be completed by another provider, this initial triage assessment does not replace that evaluation, and the importance of remaining in the ED until their evaluation is complete.    Lorelee New, PA-C 10/27/20 2014    Virgina Norfolk, DO 10/27/20 2059
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
## 2020-10-27 NOTE — ED Triage Notes (Signed)
Pt c/o left side chest pain x 1300, states it has gotten worse. She states taking deep breaths causes more pain.   Pt states she has been having lightheadedness, states she has gotten better in the last 20 minutes.   Pt states she has left shoulder pain that is moving down her arm.

## 2020-10-27 NOTE — Discharge Instructions (Addendum)
Please report to the emergency room now for further evaluation and rule out of a heart event such as a heart attack. You have declined transport by ambulance, please go straight to the ER now though. You multiple risk factors including atrial fibrillation, mitral valve prolapse, von Willebrand disease that increase your risk for a heart event.

## 2020-10-31 ENCOUNTER — Encounter: Payer: Self-pay | Admitting: Internal Medicine

## 2020-10-31 ENCOUNTER — Other Ambulatory Visit: Payer: Self-pay

## 2020-10-31 ENCOUNTER — Ambulatory Visit: Payer: BC Managed Care – PPO | Admitting: Internal Medicine

## 2020-10-31 VITALS — BP 116/70 | HR 66 | Temp 98.4°F | Resp 18 | Ht 61.0 in | Wt 118.2 lb

## 2020-10-31 DIAGNOSIS — R748 Abnormal levels of other serum enzymes: Secondary | ICD-10-CM | POA: Diagnosis not present

## 2020-10-31 DIAGNOSIS — R0789 Other chest pain: Secondary | ICD-10-CM

## 2020-10-31 DIAGNOSIS — M25562 Pain in left knee: Secondary | ICD-10-CM | POA: Diagnosis not present

## 2020-10-31 NOTE — Progress Notes (Signed)
   Subjective:   Patient ID: [name], female    DOB: [birth year], 45 y.o.   MRN: [id number]  HPI The patient is a 45 YO female coming in for ER follow up (in for chest pain and left arm pain, EKG and troponin and d-dimer without cardiac cause). She does have a lot of chronic pain and initially thought this was related to that. With the left arm pain she sought care. She is having some improvement since then. She is taking mostly otc. She is having a lot of fatigue. She denies nausea, vomiting, abdominal pain, constipation, diarrhea. Denies blood in stool. Denies change in medications or new otc medications. Had covid-19 end of May and felt to have recovered well from that.   PMH, [facility], social history reviewed and updated  Review of Systems  Constitutional:  Positive for fatigue.  HENT: Negative.    Eyes: Negative.   Respiratory:  Positive for chest tightness. Negative for cough and shortness of breath.   Cardiovascular:  Positive for chest pain. Negative for palpitations and leg swelling.  Gastrointestinal:  Negative for abdominal distention, abdominal pain, constipation, diarrhea, nausea and vomiting.  Musculoskeletal: Negative.   Skin: Negative.   Neurological: Negative.   Psychiatric/Behavioral: Negative.     Objective:  Physical Exam Constitutional:      Appearance: She is well-developed.  HENT:     Head: Normocephalic and atraumatic.  Cardiovascular:     Rate and Rhythm: Normal rate and regular rhythm.  Pulmonary:     Effort: Pulmonary effort is normal. No respiratory distress.     Breath sounds: Normal breath sounds. No wheezing or rales.     Comments: No true pain to palpation on exam Chest:     Chest wall: No tenderness.  Abdominal:     General: Bowel sounds are normal. There is no distension.     Palpations: Abdomen is soft.     Tenderness: There is no abdominal tenderness. There is no rebound.  Musculoskeletal:     Cervical back: Normal range of motion.  Skin:     General: Skin is warm and dry.  Neurological:     Mental Status: She is alert and oriented to person, place, and time.     Coordination: Coordination normal.    Vitals:   10/31/20 1542  BP: 116/70  Pulse: 66  Resp: 18  Temp: 98.4 F (36.9 C)  TempSrc: Oral  SpO2: 98%  Weight: 118 lb 3.2 oz (53.6 kg)  Height: 5\' 1"  (1.549 m)    This visit occurred during the SARS-CoV-2 public health emergency.  Safety protocols were in place, including screening questions prior to the visit, additional usage of staff PPE, and extensive cleaning of exam room while observing appropriate contact time as indicated for disinfecting solutions.   Assessment & Plan:

## 2020-11-01 DIAGNOSIS — M2550 Pain in unspecified joint: Secondary | ICD-10-CM | POA: Diagnosis not present

## 2020-11-01 DIAGNOSIS — M25551 Pain in right hip: Secondary | ICD-10-CM | POA: Diagnosis not present

## 2020-11-01 DIAGNOSIS — L930 Discoid lupus erythematosus: Secondary | ICD-10-CM | POA: Diagnosis not present

## 2020-11-01 DIAGNOSIS — D8989 Other specified disorders involving the immune mechanism, not elsewhere classified: Secondary | ICD-10-CM | POA: Diagnosis not present

## 2020-11-01 LAB — CK: Total CK: 42 U/L (ref 7–177)

## 2020-11-01 LAB — HIGH SENSITIVITY CRP: CRP, High Sensitivity: 1.63 mg/L (ref 0.000–5.000)

## 2020-11-01 LAB — AMYLASE: Amylase: 96 U/L (ref 27–131)

## 2020-11-01 LAB — LIPASE: Lipase: 88 U/L — ABNORMAL HIGH (ref 11.0–59.0)

## 2020-11-03 ENCOUNTER — Telehealth: Payer: Self-pay | Admitting: Internal Medicine

## 2020-11-03 DIAGNOSIS — R0789 Other chest pain: Secondary | ICD-10-CM | POA: Insufficient documentation

## 2020-11-03 DIAGNOSIS — R748 Abnormal levels of other serum enzymes: Secondary | ICD-10-CM | POA: Insufficient documentation

## 2020-11-03 NOTE — Telephone Encounter (Signed)
Called patient. LVM asking the patient to give our office a call to discuss. Office number was provided.

## 2020-11-03 NOTE — Assessment & Plan Note (Signed)
Checking amylase and repeating lipase. If she did have pancreatitis this would be mild and she is not having any inability to tolerate food or pain with eating or nausea/vomiting. Advised her to monitor for these symptoms and if present let us know or seek care if unable to keep down liquids.

## 2020-11-03 NOTE — Telephone Encounter (Signed)
   Patient requesting call to discuss lab results from Dr Okey Dupre. She would like to know the next steps in care. Also requesting copy of labs go to [facility], Dr Dierdre Forth

## 2020-11-03 NOTE — Assessment & Plan Note (Signed)
Checking CRP and CK to rule out muscular or inflammatory. She did have covid-19 in the last month making pericarditis a possibility however EKG was not typical and her symptoms are not classic for this. Her lipase was mildly elevated but she does not have typical symptoms for this except midline pain radiates to the back which could be related to this. Rechecking lipase and checking amylase. This appears to be resolving on its own and will monitor course. Use otc for pain. Call back for worsening or lack of resolution. ER workup able to rule out ischemic and PE.

## 2020-11-08 ENCOUNTER — Telehealth: Payer: Self-pay

## 2020-11-08 NOTE — Telephone Encounter (Signed)
There is no follow up of that needed unless worsening pain.

## 2020-11-08 NOTE — Telephone Encounter (Signed)
Patient has been made aware.

## 2020-11-08 NOTE — Telephone Encounter (Signed)
Patient would like to know what the next steps are in regards to her elevated lipase level. She stated that she is no longer having any symptoms. Please advise

## 2020-12-23 IMAGING — US US BREAST*R* LIMITED INC AXILLA
1 series · 4 of 4 positions shown · non-contrast
Comparison: Previous exam(s).

CLINICAL DATA: Follow-up probably benign mass and both breasts. The
patient reports losing 23 lb since her last mammogram.

EXAM:
DIGITAL DIAGNOSTIC BILATERAL MAMMOGRAM WITH CAD AND TOMO
ULTRASOUND BILATERAL BREAST

[Series 1: us breast*right* limited inc axilla · 0.06mm/px · 4 of 4 slices shown]
[im 1/4]
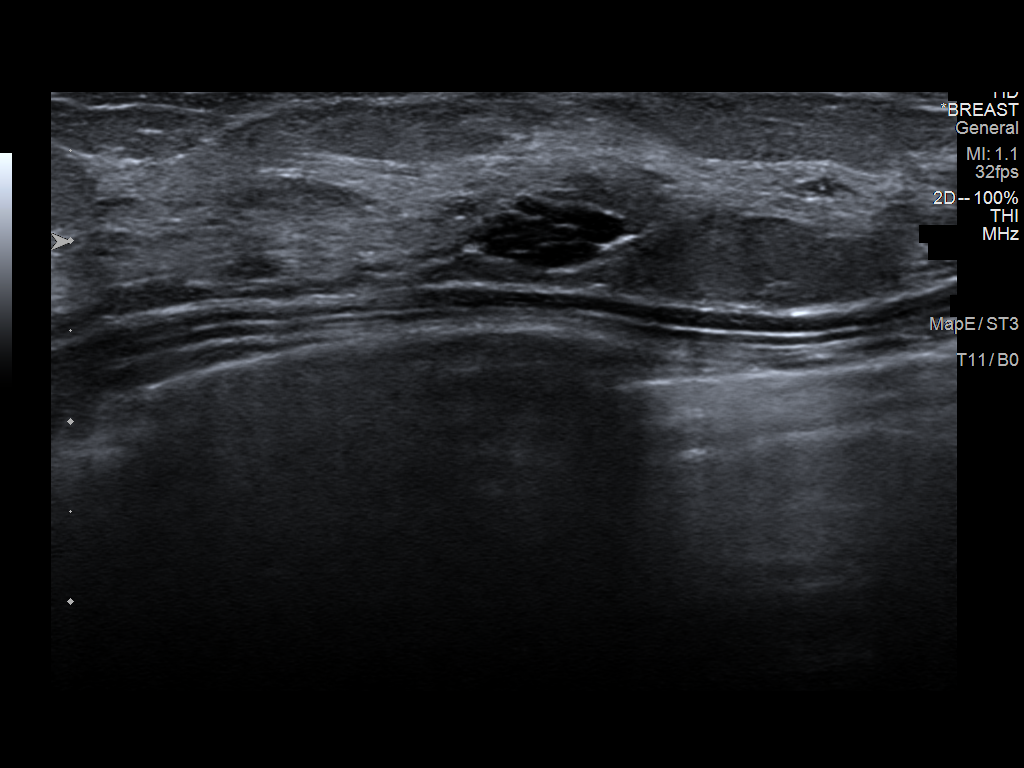
[im 2/4]
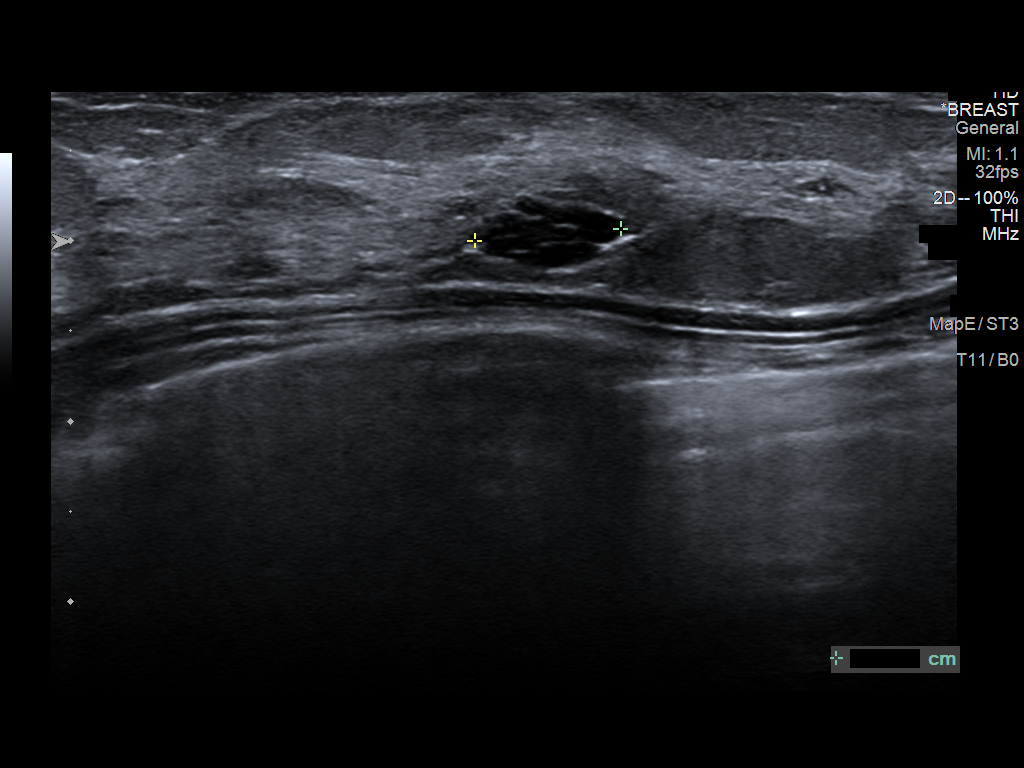
[im 3/4]
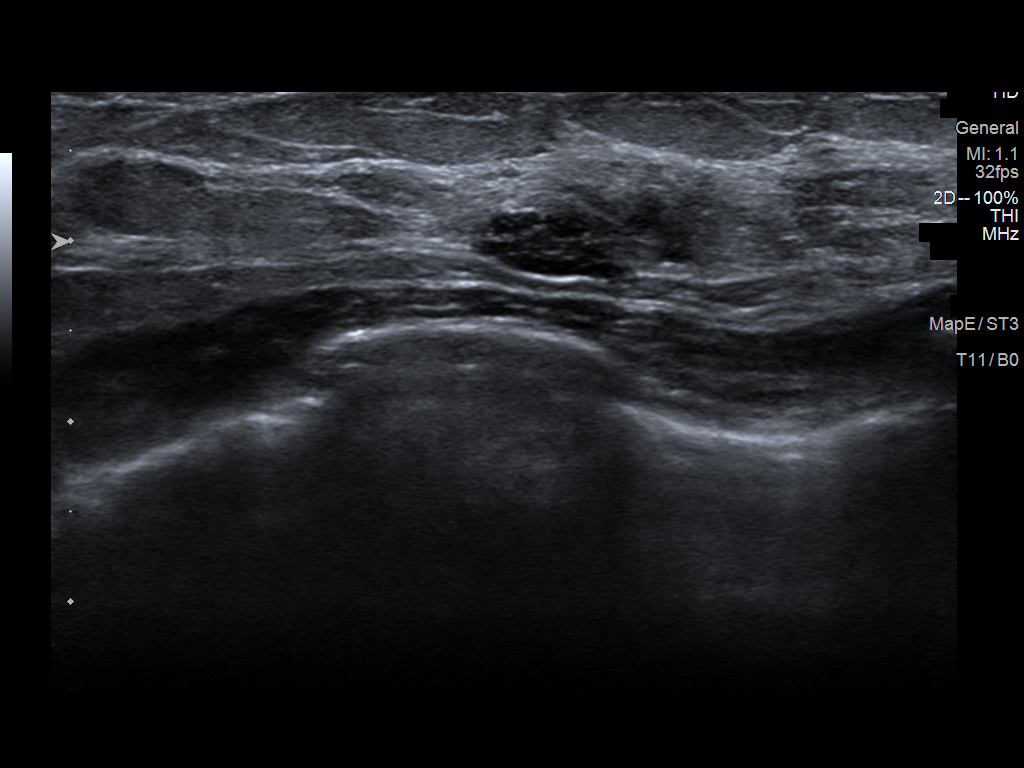
[im 4/4]
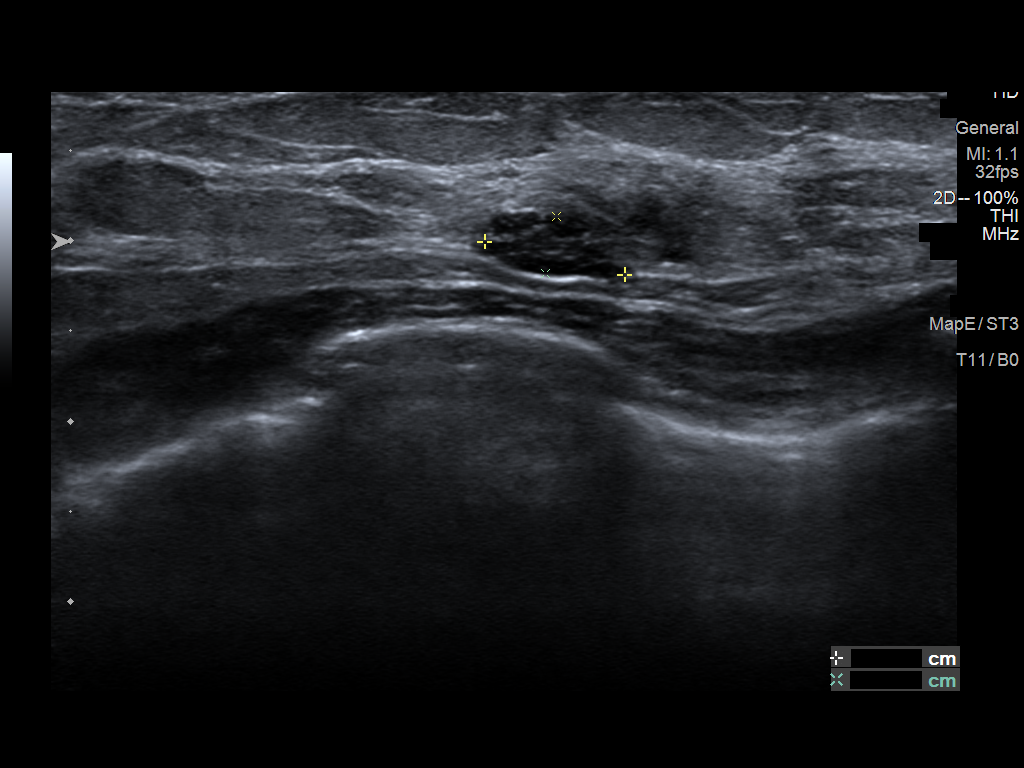

[4 of 4 positions shown; findings below may reference images not displayed]

ACR Breast Density Category c: The breast tissue is heterogeneously
dense, which may obscure small masses.
FINDINGS: The previously seen mass in the right breast is of visualized today.
The previously seen mass in the 4 o'clock position of the left
breast is partially visualized in the oblique projection and is
currently included in the craniocaudal projection. This is oval and
circumscribed. No interval findings suspicious for malignancy in
either breast.

Mammographic images were processed with CAD.

Targeted ultrasound is performed, showing a 1.4 x 1.1 x 0.4 cm oval,
horizontally oriented, circumscribed mass with anechoic and
hypoechoic components in the 4 o'clock position of the left breast,
3 cm from the nipple. This has more anechoic components than on the
previous examinations and measured 1.2 x 1.1 x 0.3 cm on 09/20/2019
and 1.1 x 1.0 x 0.3 cm on 02/03/2019.

A similar appearing mass in the 3:30 o'clock position of the right
breast, 2 cm from the nipple, currently measures 0.8 x 0.8 x 0.3 cm.
This measured 0.9 x 0.9 x 0.3 cm on 09/20/2019 1.1 x 0.8 x 0 4 cm on
02/03/2019.
IMPRESSION: 1. Mild increase in size of cystic components within the previously
demonstrated probably benign cluster of cysts in the 4 o'clock
position of the left breast.
2. Stable probably benign cluster of cysts in the 3:30 o'clock
position of the right breast.
3. No evidence of malignancy elsewhere in either breast.

RECOMMENDATION:
Bilateral diagnostic mammogram and bilateral breast ultrasound in 1
year to complete greater than 2 years of follow-up of the probably
benign cluster of cysts in each breast.

I have discussed the findings and recommendations with the patient.
If applicable, a reminder letter will be sent to the patient
regarding the next appointment.

BI-RADS CATEGORY  3: Probably benign.

## 2021-01-01 DIAGNOSIS — R898 Other abnormal findings in specimens from other organs, systems and tissues: Secondary | ICD-10-CM | POA: Diagnosis not present

## 2021-01-05 DIAGNOSIS — M6752 Plica syndrome, left knee: Secondary | ICD-10-CM | POA: Diagnosis not present

## 2021-01-11 ENCOUNTER — Encounter: Payer: Self-pay | Admitting: *Deleted

## 2021-01-11 NOTE — Progress Notes (Signed)
Referral received, records under review and appt set up to follow

## 2021-01-17 DIAGNOSIS — G43719 Chronic migraine without aura, intractable, without status migrainosus: Secondary | ICD-10-CM | POA: Diagnosis not present

## 2021-01-17 DIAGNOSIS — G43019 Migraine without aura, intractable, without status migrainosus: Secondary | ICD-10-CM | POA: Diagnosis not present

## 2021-01-24 ENCOUNTER — Encounter: Payer: Self-pay | Admitting: *Deleted

## 2021-02-01 DIAGNOSIS — D8989 Other specified disorders involving the immune mechanism, not elsewhere classified: Secondary | ICD-10-CM | POA: Diagnosis not present

## 2021-03-22 DIAGNOSIS — D225 Melanocytic nevi of trunk: Secondary | ICD-10-CM | POA: Diagnosis not present

## 2021-03-22 DIAGNOSIS — L578 Other skin changes due to chronic exposure to nonionizing radiation: Secondary | ICD-10-CM | POA: Diagnosis not present

## 2021-03-22 DIAGNOSIS — L570 Actinic keratosis: Secondary | ICD-10-CM | POA: Diagnosis not present

## 2021-03-22 DIAGNOSIS — L720 Epidermal cyst: Secondary | ICD-10-CM | POA: Diagnosis not present

## 2021-03-22 DIAGNOSIS — L729 Follicular cyst of the skin and subcutaneous tissue, unspecified: Secondary | ICD-10-CM | POA: Diagnosis not present

## 2021-03-22 DIAGNOSIS — D485 Neoplasm of uncertain behavior of skin: Secondary | ICD-10-CM | POA: Diagnosis not present

## 2021-03-28 DIAGNOSIS — M65862 Other synovitis and tenosynovitis, left lower leg: Secondary | ICD-10-CM | POA: Diagnosis not present

## 2021-03-28 DIAGNOSIS — M659 Synovitis and tenosynovitis, unspecified: Secondary | ICD-10-CM | POA: Diagnosis not present

## 2021-03-28 DIAGNOSIS — G8918 Other acute postprocedural pain: Secondary | ICD-10-CM | POA: Diagnosis not present

## 2021-03-28 DIAGNOSIS — M794 Hypertrophy of (infrapatellar) fat pad: Secondary | ICD-10-CM | POA: Diagnosis not present

## 2021-03-28 DIAGNOSIS — M94262 Chondromalacia, left knee: Secondary | ICD-10-CM | POA: Diagnosis not present

## 2021-03-28 DIAGNOSIS — M6752 Plica syndrome, left knee: Secondary | ICD-10-CM | POA: Diagnosis not present

## 2021-04-02 ENCOUNTER — Other Ambulatory Visit: Payer: Self-pay | Admitting: Obstetrics and Gynecology

## 2021-04-02 DIAGNOSIS — N6009 Solitary cyst of unspecified breast: Secondary | ICD-10-CM

## 2021-05-10 DIAGNOSIS — M2550 Pain in unspecified joint: Secondary | ICD-10-CM | POA: Diagnosis not present

## 2021-05-10 DIAGNOSIS — M25551 Pain in right hip: Secondary | ICD-10-CM | POA: Diagnosis not present

## 2021-05-10 DIAGNOSIS — M359 Systemic involvement of connective tissue, unspecified: Secondary | ICD-10-CM | POA: Diagnosis not present

## 2021-05-10 DIAGNOSIS — L930 Discoid lupus erythematosus: Secondary | ICD-10-CM | POA: Diagnosis not present

## 2021-05-11 ENCOUNTER — Telehealth: Payer: BC Managed Care – PPO | Admitting: Emergency Medicine

## 2021-05-11 ENCOUNTER — Other Ambulatory Visit: Payer: BC Managed Care – PPO

## 2021-05-11 DIAGNOSIS — J019 Acute sinusitis, unspecified: Secondary | ICD-10-CM | POA: Diagnosis not present

## 2021-05-11 MED ORDER — SALINE SPRAY 0.65 % NA SOLN: 1.0000 | NASAL | 0 refills | Status: DC | PRN

## 2021-05-11 MED ORDER — DOXYCYCLINE HYCLATE 100 MG PO CAPS
100.0000 mg | ORAL_CAPSULE | Freq: Two times a day (BID) | ORAL | 0 refills | Status: DC
Start: 2021-05-11 — End: 2021-06-08

## 2021-05-11 NOTE — Progress Notes (Signed)
Virtual Visit Consent   [name], you are scheduled for a virtual visit with a  provider today.     Just as with appointments in the office, your consent must be obtained to participate.  Your consent will be active for this visit and any virtual visit you may have with one of our providers in the next 365 days.     If you have a MyChart account, a copy of this consent can be sent to you electronically.  All virtual visits are billed to your insurance company just like a traditional visit in the office.    As this is a virtual visit, video technology does not allow for your provider to perform a traditional examination.  This may limit your provider's ability to fully assess your condition.  If your provider identifies any concerns that need to be evaluated in person or the need to arrange testing (such as labs, EKG, etc.), we will make arrangements to do so.     Although advances in technology are sophisticated, we cannot ensure that it will always work on either your end or our end.  If the connection with a video visit is poor, the visit may have to be switched to a telephone visit.  With either a video or telephone visit, we are not always able to ensure that we have a secure connection.     I need to obtain your verbal consent now.   Are you willing to proceed with your visit today?    [name] has provided verbal consent on 05/11/2021 for a virtual visit video.   Roxy Horseman, PA-C   Date: 05/11/2021 11:28 AM   Virtual Visit via Video Note   I, Roxy Horseman, connected with  [name]  ([id number], 23-Oct-1975) on 05/11/21 at 11:30 AM EST by a video-enabled telemedicine application and verified that I am speaking with the correct person using two identifiers.  Location: Patient: Virtual Visit Location Patient: Home Provider: Virtual Visit Location Provider: Home Office   I discussed the limitations of evaluation and management by telemedicine and the  availability of in person appointments. The patient expressed understanding and agreed to proceed.    History of Present Illness: [name] is a 46 y.o. who identifies as a female who was assigned female at birth, and is being seen today for sinus congestion.  She states that she has sinus pressure.  States that she is 8 days out of COVID.  States that she doesn't have any other additional symptoms. Denies any antibiotic allergies.  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   Epidermoid cyst of skin 05/19/2018   Hyperbilirubinemia 07/30/2017   Routine general medical examination at a health care facility 04/07/2017   Ulnar neuritis, right 11/23/2016   Migraine 09/25/2016   Von Willebrand disease 11/03/2010   Atrial fibrillation (HCC) 06/15/2008    Allergies:  Allergies  Allergen Reactions   Avocado Nausea And Vomiting   Pineapple Hives   Sesame Oil Hives   Almond Oil    Cardamom [Elettaria Cardamomum Minuscula]    Compazine [Prochlorperazine] Other (See Comments)    "lock jaw"   Imitrex [Sumatriptan]     SOB   Other Hives   Prochlorperazine Edisylate Other (See Comments)    Patient states that the reaction to the  compazine is lockjaw.   Sulfonamide Derivatives Hives   Medications:  Current Outpatient Medications:    acetaminophen (TYLENOL) 500 MG tablet, Take 1,000 mg by mouth every 6 (six) hours as needed for moderate pain or headache., Disp: , Rfl:    doxepin (SINEQUAN) 10 MG capsule, Take 20 mg by mouth at bedtime., Disp: , Rfl:    eletriptan (RELPAX) 40 MG tablet, Take 40 mg by mouth as needed for migraine., Disp: , Rfl:    folic acid (FOLVITE) 1 MG tablet, Take 1 mg by mouth daily., Disp: , Rfl:    levocetirizine (XYZAL) 5 MG tablet, Take 5-10 mg by mouth See admin instructions. 10 mg in the morning  5 mg at bedtime.,  Disp: , Rfl:    loteprednol (LOTEMAX) 0.5 % ophthalmic suspension, Place 1 drop into both eyes daily as needed (itchy/dry eyes)., Disp: , Rfl:    methotrexate 2.5 MG tablet, Take 10 mg by mouth every Monday. Caution:Chemotherapy. Protect from light. 4 pills once per week, Disp: , Rfl:    norgestrel-ethinyl estradiol (LOW-OGESTREL) 0.3-30 MG-MCG tablet, Take 1 tablet by mouth daily., Disp: , Rfl:   Observations/Objective: Patient is well-developed, well-nourished in no acute distress.  Resting comfortably at home.  Head is normocephalic, atraumatic.  No labored breathing.  Speech is clear and coherent with logical content.  Patient is alert and oriented at baseline.  Sounds congested  Assessment and Plan: 1. Acute sinusitis, recurrence not specified, unspecified location  -Doxy -Nasal saline rinses  Follow Up Instructions: I discussed the assessment and treatment plan with the patient. The patient was provided an opportunity to ask questions and all were answered. The patient agreed with the plan and demonstrated an understanding of the instructions.  A copy of instructions were sent to the patient via MyChart unless otherwise noted below.     The patient was advised to call back or seek an in-person evaluation if the symptoms worsen or if the condition fails to improve as anticipated.  Time:  I spent 8 minutes with the patient via telehealth technology discussing the above problems/concerns.    Roxy Horseman, PA-C

## 2021-05-14 ENCOUNTER — Encounter: Payer: Self-pay | Admitting: Internal Medicine

## 2021-05-14 ENCOUNTER — Ambulatory Visit: Payer: BC Managed Care – PPO | Admitting: Internal Medicine

## 2021-05-14 ENCOUNTER — Other Ambulatory Visit: Payer: Self-pay

## 2021-05-14 VITALS — BP 102/64 | HR 65 | Ht 61.0 in | Wt 119.4 lb

## 2021-05-14 DIAGNOSIS — E673 Hypervitaminosis D: Secondary | ICD-10-CM | POA: Diagnosis not present

## 2021-05-14 LAB — COMPREHENSIVE METABOLIC PANEL
ALT: 16 U/L (ref 0–35)
AST: 14 U/L (ref 0–37)
Albumin: 4.3 g/dL (ref 3.5–5.2)
Alkaline Phosphatase: 32 U/L — ABNORMAL LOW (ref 39–117)
BUN: 18 mg/dL (ref 6–23)
CO2: 27 mEq/L (ref 19–32)
Calcium: 9.9 mg/dL (ref 8.4–10.5)
Chloride: 106 mEq/L (ref 96–112)
Creatinine, Ser: 0.75 mg/dL (ref 0.40–1.20)
GFR: 97.67 mL/min (ref >60.00)
Glucose, Bld: 93 mg/dL (ref 70–99)
Potassium: 4.9 mEq/L (ref 3.5–5.1)
Sodium: 140 mEq/L (ref 135–145)
Total Bilirubin: 0.9 mg/dL (ref 0.2–1.2)
Total Protein: 7.6 g/dL (ref 6.0–8.3)

## 2021-05-14 LAB — VITAMIN D 25 HYDROXY (VIT D DEFICIENCY, FRACTURES): VITD: 58.71 ng/mL (ref 30.00–100.00)
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
## 2021-05-17 LAB — VITAMIN A: Vitamin A (Retinoic Acid): 102 ug/dL — ABNORMAL HIGH (ref 38–98)

## 2021-05-17 LAB — VITAMIN D 1,25 DIHYDROXY
Vitamin D 1, 25 (OH)2 Total: 107 pg/mL — ABNORMAL HIGH (ref 18–72)
Vitamin D2 1, 25 (OH)2: <8 pg/mL
Vitamin D3 1, 25 (OH)2: 107 pg/mL

## 2021-05-17 LAB — PARATHYROID HORMONE, INTACT (NO CA): PTH: 30 pg/mL (ref 16–77)

## 2021-05-18 ENCOUNTER — Encounter: Payer: Self-pay | Admitting: Internal Medicine

## 2021-05-23 DIAGNOSIS — Z3040 Encounter for surveillance of contraceptives, unspecified: Secondary | ICD-10-CM | POA: Diagnosis not present

## 2021-05-23 DIAGNOSIS — Z01419 Encounter for gynecological examination (general) (routine) without abnormal findings: Secondary | ICD-10-CM | POA: Diagnosis not present

## 2021-05-23 DIAGNOSIS — Z124 Encounter for screening for malignant neoplasm of cervix: Secondary | ICD-10-CM | POA: Diagnosis not present

## 2021-05-23 DIAGNOSIS — Z6822 Body mass index (BMI) 22.0-22.9, adult: Secondary | ICD-10-CM | POA: Diagnosis not present

## 2021-05-23 DIAGNOSIS — R110 Nausea: Secondary | ICD-10-CM | POA: Diagnosis not present

## 2021-05-31 IMAGING — DX DG CHEST 2V
2 series · 2 of 2 positions shown · non-contrast
Comparison: Chest x-ray 06/25/2018.

CLINICAL DATA: 44-year-old female with 1 day history of fever.

EXAM:
CHEST - 2 VIEW

[chest pa]
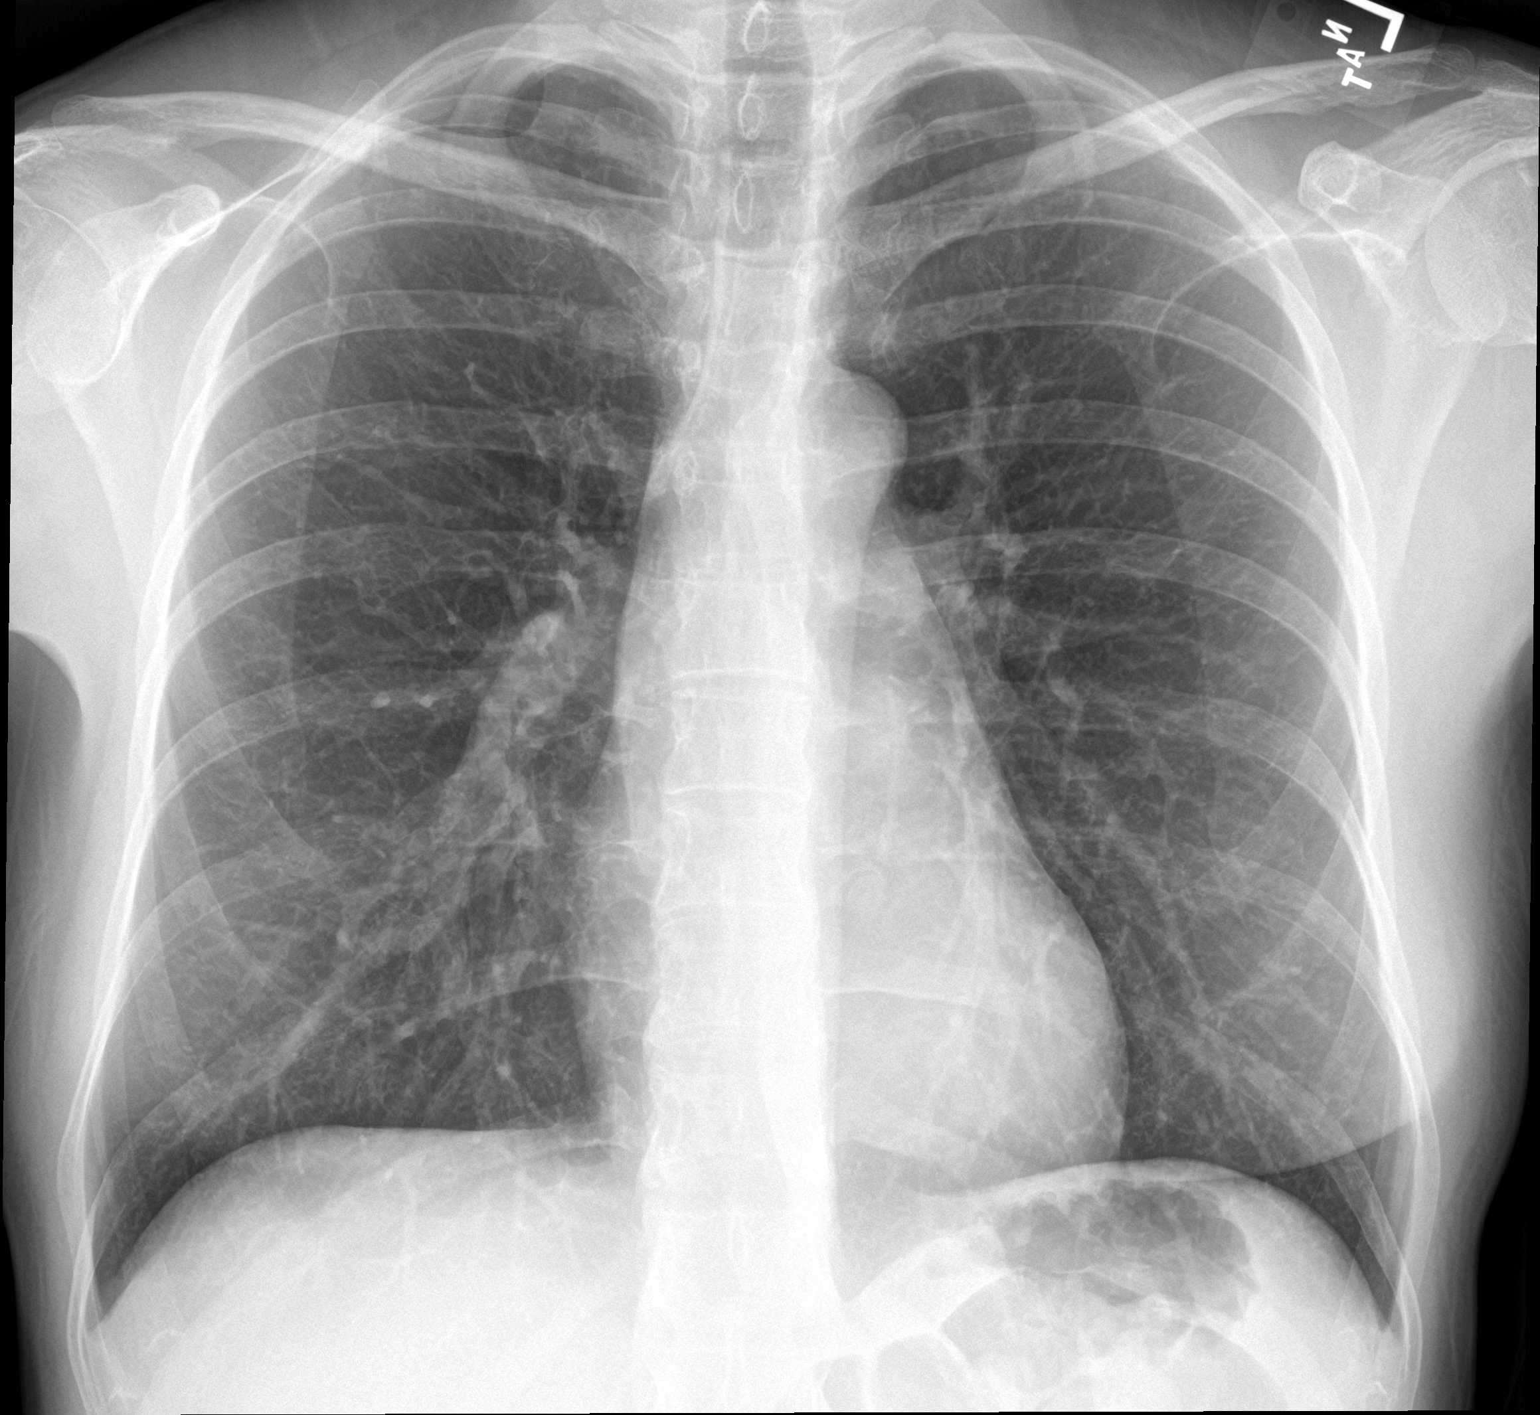

[chest lat]
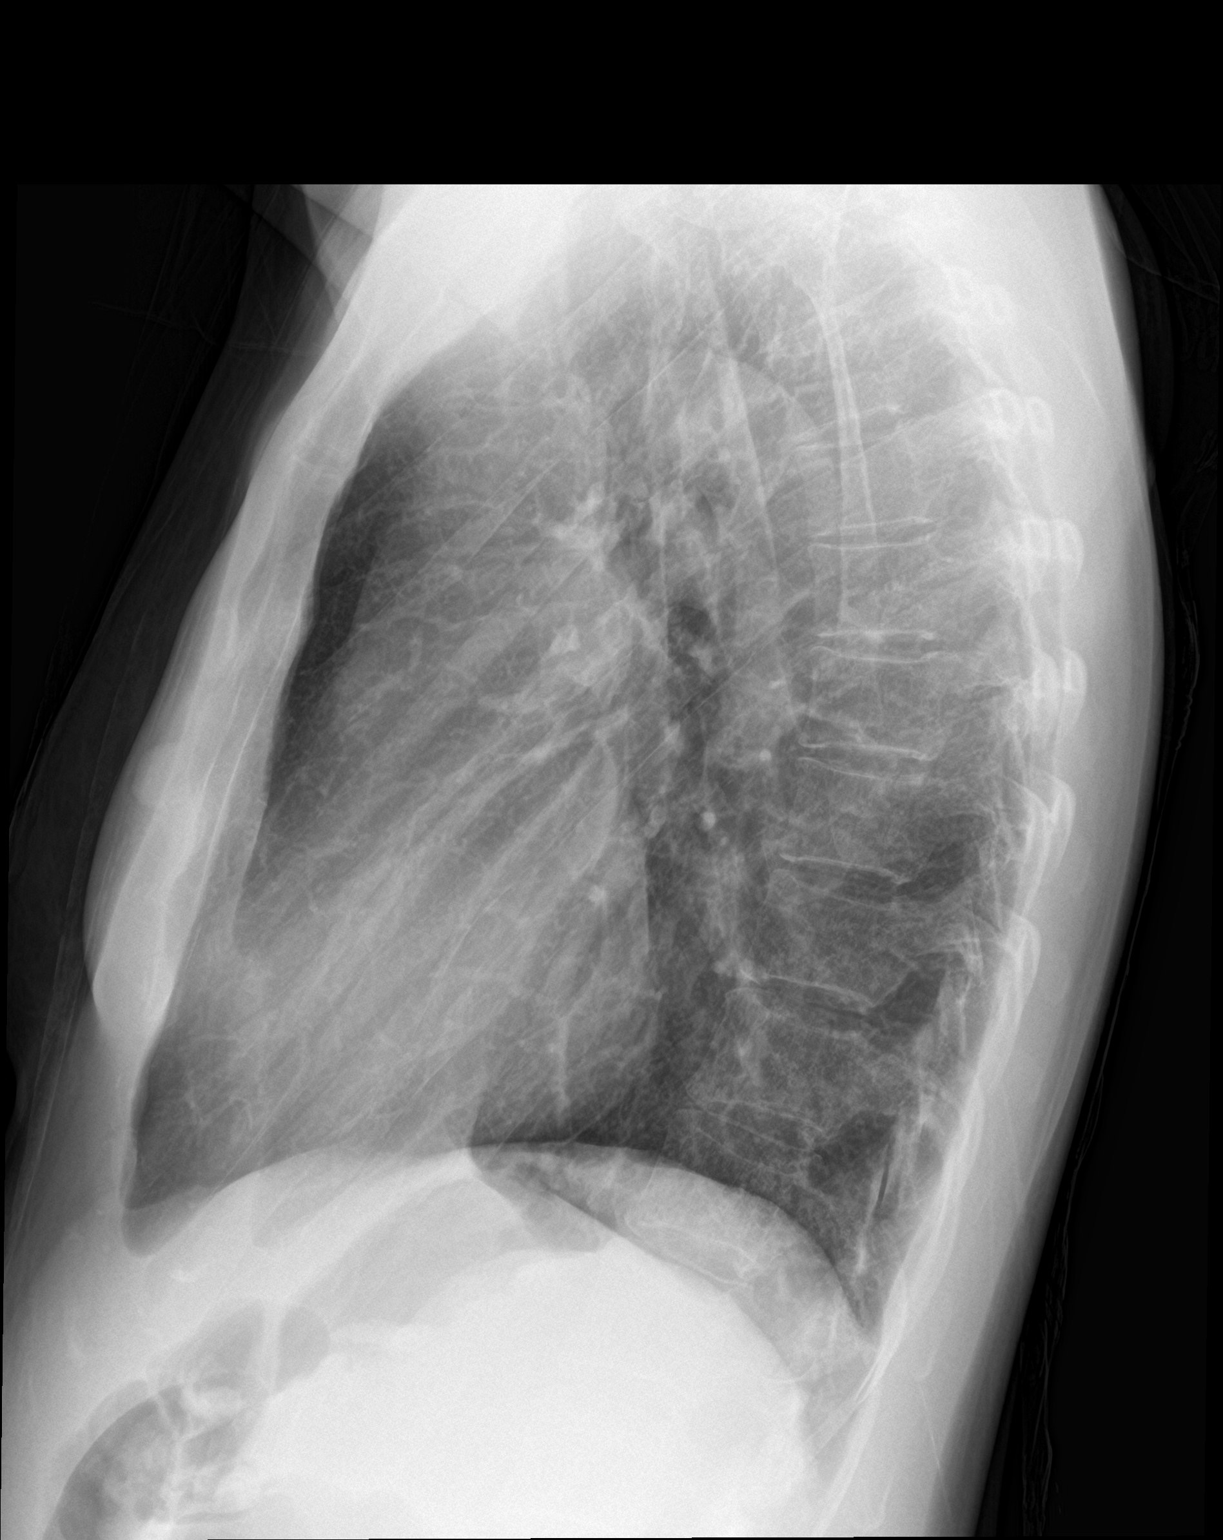

[2 of 2 positions shown; findings below may reference images not displayed]

FINDINGS: Lung volumes are normal. No consolidative airspace disease. No
pleural effusions. No pneumothorax. No pulmonary nodule or mass
noted. Pulmonary vasculature and the cardiomediastinal silhouette
are within normal limits.
IMPRESSION: No radiographic evidence of acute cardiopulmonary disease.

## 2021-06-08 ENCOUNTER — Ambulatory Visit: Payer: BC Managed Care – PPO | Admitting: Internal Medicine

## 2021-06-08 ENCOUNTER — Encounter: Payer: Self-pay | Admitting: Internal Medicine

## 2021-06-08 ENCOUNTER — Other Ambulatory Visit: Payer: Self-pay

## 2021-06-08 VITALS — BP 112/62 | HR 60 | Temp 98.5°F | Ht 61.0 in | Wt 122.0 lb

## 2021-06-08 DIAGNOSIS — H9012 Conductive hearing loss, unilateral, left ear, with unrestricted hearing on the contralateral side: Secondary | ICD-10-CM | POA: Diagnosis not present

## 2021-06-08 DIAGNOSIS — H9192 Unspecified hearing loss, left ear: Secondary | ICD-10-CM | POA: Insufficient documentation

## 2021-06-08 DIAGNOSIS — D6800 Von Willebrand disease, unspecified: Secondary | ICD-10-CM

## 2021-06-08 DIAGNOSIS — J3489 Other specified disorders of nose and nasal sinuses: Secondary | ICD-10-CM | POA: Diagnosis not present

## 2021-06-08 DIAGNOSIS — J0110 Acute frontal sinusitis, unspecified: Secondary | ICD-10-CM | POA: Diagnosis not present

## 2021-06-08 MED ORDER — GUAIFENESIN ER 600 MG PO TB12: 1200.0000 mg | ORAL_TABLET | Freq: Two times a day (BID) | ORAL | 0 refills | Status: DC | PRN

## 2021-06-08 MED ORDER — DOXYCYCLINE HYCLATE 100 MG PO CAPS: 100.0000 mg | ORAL_CAPSULE | Freq: Two times a day (BID) | ORAL | 0 refills | Status: DC

## 2021-06-08 NOTE — Assessment & Plan Note (Signed)
With recent improved with doxy course but still mild persistent, somewhat complicated it seems with congestion, left sided nose bleed.  Will repeat doxy course, CT sinus, mucinex bid prn, and consider ENT pending CT.

## 2021-06-08 NOTE — Patient Instructions (Signed)
Your Left ear was irrigated of wax today  Please take all new medication as prescribed - the antibiotic  You can also take Mucinex (or it's generic off brand) for congestion, and tylenol as needed for pain.  You will be contacted regarding the referral for: CT Sinus  Please continue all other medications as before, and refills have been done if requested.  Please have the pharmacy call with any other refills you may need.  Please keep your appointments with your specialists as you may have planned

## 2021-06-08 NOTE — Assessment & Plan Note (Signed)
Improved s/p wax impaction irrigation   ° °Ceruminosis is noted.  Wax is removed by syringing and manual debridement. Instructions for home care to prevent wax buildup are given. ° °

## 2021-06-08 NOTE — Progress Notes (Signed)
Patient consent obtained. Irrigation with water and peroxide performed on left ear. Full view of tympanic membranes after procedure.  Patient stated that she had pain from ear wax being stuck, but felt some relief after lying down for a few minutes
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
## 2021-06-08 NOTE — Assessment & Plan Note (Signed)
O/w asympt, pt reassured regarding current med tx exposure

## 2021-06-11 ENCOUNTER — Telehealth: Payer: Self-pay | Admitting: Internal Medicine

## 2021-06-11 NOTE — Telephone Encounter (Signed)
Ok to f/u with ent as planned

## 2021-06-11 NOTE — Telephone Encounter (Signed)
Called patient to regarding f/u with ENT. Patient verbalize understanding. No further questions
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
## 2021-06-12 DIAGNOSIS — L930 Discoid lupus erythematosus: Secondary | ICD-10-CM | POA: Diagnosis not present

## 2021-06-12 DIAGNOSIS — D485 Neoplasm of uncertain behavior of skin: Secondary | ICD-10-CM | POA: Diagnosis not present

## 2021-06-12 DIAGNOSIS — R0981 Nasal congestion: Secondary | ICD-10-CM | POA: Diagnosis not present

## 2021-06-12 DIAGNOSIS — H938X2 Other specified disorders of left ear: Secondary | ICD-10-CM | POA: Diagnosis not present

## 2021-06-12 DIAGNOSIS — R040 Epistaxis: Secondary | ICD-10-CM | POA: Diagnosis not present

## 2021-06-12 DIAGNOSIS — J3489 Other specified disorders of nose and nasal sinuses: Secondary | ICD-10-CM | POA: Diagnosis not present

## 2021-06-13 ENCOUNTER — Ambulatory Visit
Admission: RE | Admit: 2021-06-13 | Discharge: 2021-06-13 | Disposition: A | Discharge status: Home / Self Care | Payer: BC Managed Care – PPO | Source: Ambulatory Visit | Attending: Obstetrics and Gynecology | Admitting: Obstetrics and Gynecology

## 2021-06-13 ENCOUNTER — Other Ambulatory Visit: Payer: Self-pay

## 2021-06-13 DIAGNOSIS — R922 Inconclusive mammogram: Secondary | ICD-10-CM | POA: Diagnosis not present

## 2021-06-13 DIAGNOSIS — N6009 Solitary cyst of unspecified breast: Secondary | ICD-10-CM

## 2021-06-15 ENCOUNTER — Other Ambulatory Visit: Payer: BC Managed Care – PPO

## 2021-06-25 IMAGING — DX DG CHEST 1V PORT
1 series · 1 of 1 positions shown · non-contrast
Comparison: Radiograph 10/02/2020

CLINICAL DATA: Chest pain, shortness of breath

EXAM:
PORTABLE CHEST 1 VIEW

[chest pa]
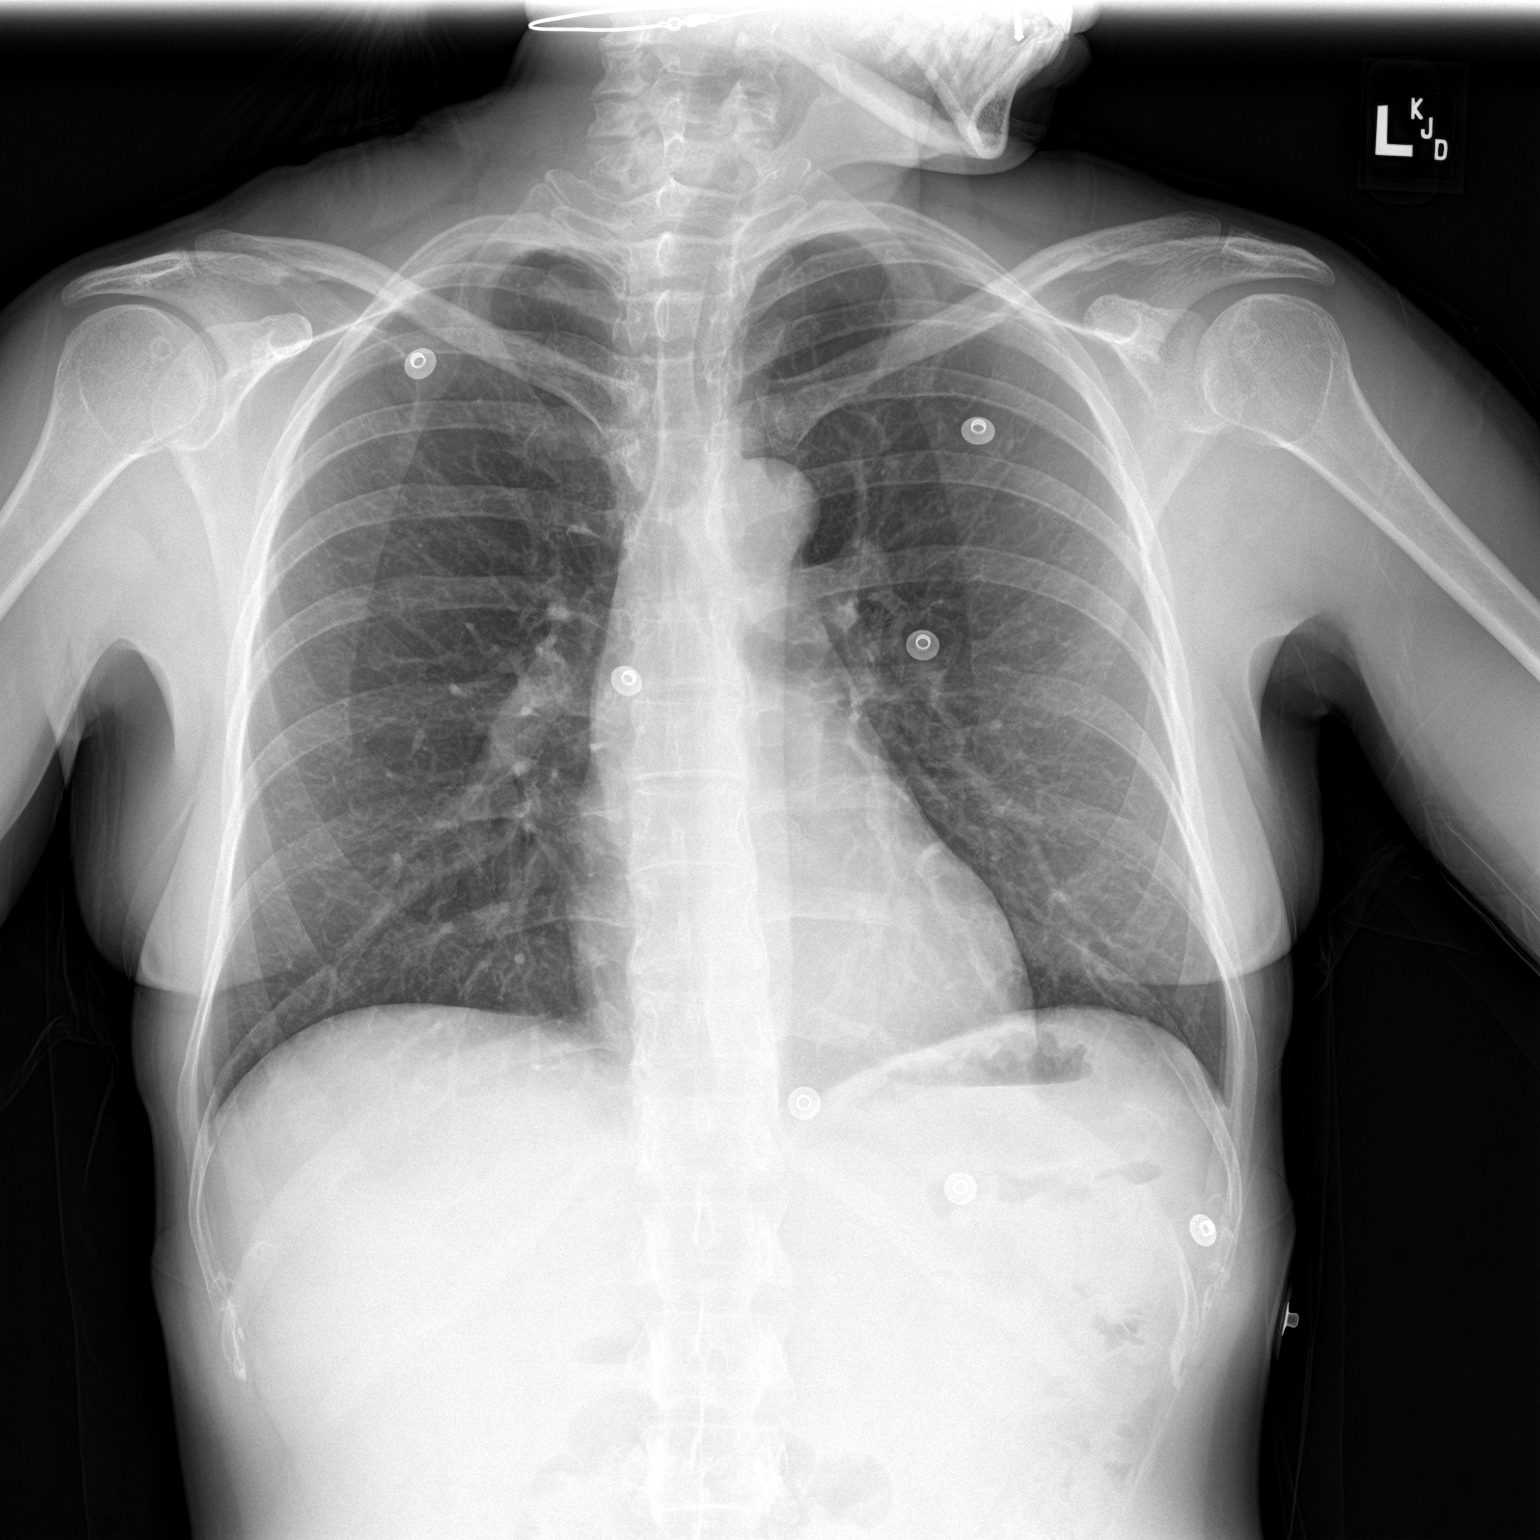

[1 of 1 positions shown; findings below may reference images not displayed]

FINDINGS: No consolidation, features of edema, pneumothorax, or effusion.
Pulmonary vascularity is normally distributed. The cardiomediastinal
contours are unremarkable. No acute osseous or soft tissue
abnormality.
IMPRESSION: No acute cardiopulmonary abnormality.

## 2021-06-26 DIAGNOSIS — M25562 Pain in left knee: Secondary | ICD-10-CM | POA: Diagnosis not present

## 2021-07-03 DIAGNOSIS — M25562 Pain in left knee: Secondary | ICD-10-CM | POA: Diagnosis not present

## 2021-07-05 DIAGNOSIS — R0981 Nasal congestion: Secondary | ICD-10-CM | POA: Diagnosis not present

## 2021-07-05 DIAGNOSIS — H9202 Otalgia, left ear: Secondary | ICD-10-CM | POA: Diagnosis not present

## 2021-07-06 DIAGNOSIS — M25562 Pain in left knee: Secondary | ICD-10-CM | POA: Diagnosis not present

## 2021-07-09 DIAGNOSIS — M25562 Pain in left knee: Secondary | ICD-10-CM | POA: Diagnosis not present

## 2021-07-11 DIAGNOSIS — M25562 Pain in left knee: Secondary | ICD-10-CM | POA: Diagnosis not present

## 2021-07-16 DIAGNOSIS — M25562 Pain in left knee: Secondary | ICD-10-CM | POA: Diagnosis not present

## 2021-07-18 DIAGNOSIS — J320 Chronic maxillary sinusitis: Secondary | ICD-10-CM | POA: Diagnosis not present

## 2021-07-18 DIAGNOSIS — J3489 Other specified disorders of nose and nasal sinuses: Secondary | ICD-10-CM | POA: Diagnosis not present

## 2021-07-18 DIAGNOSIS — M7632 Iliotibial band syndrome, left leg: Secondary | ICD-10-CM | POA: Diagnosis not present

## 2021-07-18 DIAGNOSIS — J342 Deviated nasal septum: Secondary | ICD-10-CM | POA: Diagnosis not present

## 2021-08-06 DIAGNOSIS — G43019 Migraine without aura, intractable, without status migrainosus: Secondary | ICD-10-CM | POA: Diagnosis not present

## 2021-08-06 DIAGNOSIS — G43719 Chronic migraine without aura, intractable, without status migrainosus: Secondary | ICD-10-CM | POA: Diagnosis not present

## 2021-08-08 DIAGNOSIS — M359 Systemic involvement of connective tissue, unspecified: Secondary | ICD-10-CM | POA: Diagnosis not present

## 2021-08-21 DIAGNOSIS — M542 Cervicalgia: Secondary | ICD-10-CM | POA: Diagnosis not present

## 2021-08-21 DIAGNOSIS — G43719 Chronic migraine without aura, intractable, without status migrainosus: Secondary | ICD-10-CM | POA: Diagnosis not present

## 2021-08-21 DIAGNOSIS — G43019 Migraine without aura, intractable, without status migrainosus: Secondary | ICD-10-CM | POA: Diagnosis not present

## 2021-08-23 DIAGNOSIS — M25862 Other specified joint disorders, left knee: Secondary | ICD-10-CM | POA: Diagnosis not present

## 2021-08-23 DIAGNOSIS — M25562 Pain in left knee: Secondary | ICD-10-CM | POA: Diagnosis not present

## 2021-09-03 DIAGNOSIS — M542 Cervicalgia: Secondary | ICD-10-CM | POA: Diagnosis not present

## 2021-09-03 DIAGNOSIS — G43719 Chronic migraine without aura, intractable, without status migrainosus: Secondary | ICD-10-CM | POA: Diagnosis not present

## 2021-09-03 DIAGNOSIS — G43019 Migraine without aura, intractable, without status migrainosus: Secondary | ICD-10-CM | POA: Diagnosis not present

## 2021-09-05 DIAGNOSIS — G5603 Carpal tunnel syndrome, bilateral upper limbs: Secondary | ICD-10-CM | POA: Diagnosis not present

## 2021-09-12 ENCOUNTER — Ambulatory Visit: Payer: BC Managed Care – PPO | Admitting: Internal Medicine

## 2021-09-17 DIAGNOSIS — F40248 Other situational type phobia: Secondary | ICD-10-CM | POA: Diagnosis not present

## 2021-09-21 ENCOUNTER — Encounter: Payer: Self-pay | Admitting: Internal Medicine

## 2021-09-21 ENCOUNTER — Ambulatory Visit (INDEPENDENT_AMBULATORY_CARE_PROVIDER_SITE_OTHER): Payer: BC Managed Care – PPO | Admitting: Internal Medicine

## 2021-09-21 VITALS — BP 102/66 | HR 79 | Ht 61.0 in | Wt 120.6 lb

## 2021-09-21 DIAGNOSIS — E673 Hypervitaminosis D: Secondary | ICD-10-CM | POA: Diagnosis not present

## 2021-09-21 DIAGNOSIS — E670 Hypervitaminosis A: Secondary | ICD-10-CM

## 2021-09-21 LAB — BASIC METABOLIC PANEL
BUN: 23 mg/dL (ref 6–23)
CO2: 28 mEq/L (ref 19–32)
Calcium: 9.6 mg/dL (ref 8.4–10.5)
Chloride: 103 mEq/L (ref 96–112)
Creatinine, Ser: 0.87 mg/dL (ref 0.40–1.20)
GFR: 81.53 mL/min (ref >60.00)
Glucose, Bld: 78 mg/dL (ref 70–99)
Potassium: 4.2 mEq/L (ref 3.5–5.1)
Sodium: 139 mEq/L (ref 135–145)

## 2021-09-21 LAB — VITAMIN D 25 HYDROXY (VIT D DEFICIENCY, FRACTURES): VITD: 51.38 ng/mL (ref 30.00–100.00)
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
## 2021-09-24 DIAGNOSIS — G43719 Chronic migraine without aura, intractable, without status migrainosus: Secondary | ICD-10-CM | POA: Diagnosis not present

## 2021-09-24 DIAGNOSIS — G43019 Migraine without aura, intractable, without status migrainosus: Secondary | ICD-10-CM | POA: Diagnosis not present

## 2021-09-24 DIAGNOSIS — M542 Cervicalgia: Secondary | ICD-10-CM | POA: Diagnosis not present

## 2021-09-24 DIAGNOSIS — F40248 Other situational type phobia: Secondary | ICD-10-CM | POA: Diagnosis not present

## 2021-09-27 LAB — VITAMIN D 1,25 DIHYDROXY
Vitamin D 1, 25 (OH)2 Total: 66 pg/mL (ref 18–72)
Vitamin D2 1, 25 (OH)2: <8 pg/mL
Vitamin D3 1, 25 (OH)2: 66 pg/mL

## 2021-09-27 LAB — PARATHYROID HORMONE, INTACT (NO CA): PTH: 21 pg/mL (ref 16–77)

## 2021-09-27 LAB — VITAMIN A: Vitamin A (Retinoic Acid): 75 ug/dL (ref 38–98)

## 2021-10-02 DIAGNOSIS — F40248 Other situational type phobia: Secondary | ICD-10-CM | POA: Diagnosis not present

## 2021-10-08 DIAGNOSIS — G43019 Migraine without aura, intractable, without status migrainosus: Secondary | ICD-10-CM | POA: Diagnosis not present

## 2021-10-08 DIAGNOSIS — M542 Cervicalgia: Secondary | ICD-10-CM | POA: Diagnosis not present

## 2021-10-08 DIAGNOSIS — G43719 Chronic migraine without aura, intractable, without status migrainosus: Secondary | ICD-10-CM | POA: Diagnosis not present

## 2021-10-09 DIAGNOSIS — F40248 Other situational type phobia: Secondary | ICD-10-CM | POA: Diagnosis not present

## 2021-10-30 DIAGNOSIS — G43019 Migraine without aura, intractable, without status migrainosus: Secondary | ICD-10-CM | POA: Diagnosis not present

## 2021-10-30 DIAGNOSIS — M542 Cervicalgia: Secondary | ICD-10-CM | POA: Diagnosis not present

## 2021-10-30 DIAGNOSIS — G43719 Chronic migraine without aura, intractable, without status migrainosus: Secondary | ICD-10-CM | POA: Diagnosis not present

## 2021-10-30 DIAGNOSIS — F40248 Other situational type phobia: Secondary | ICD-10-CM | POA: Diagnosis not present

## 2021-11-13 DIAGNOSIS — G43719 Chronic migraine without aura, intractable, without status migrainosus: Secondary | ICD-10-CM | POA: Diagnosis not present

## 2021-11-13 DIAGNOSIS — M542 Cervicalgia: Secondary | ICD-10-CM | POA: Diagnosis not present

## 2021-11-16 ENCOUNTER — Ambulatory Visit (HOSPITAL_COMMUNITY)
Admission: EM | Admit: 2021-11-16 | Discharge: 2021-11-16 | Disposition: A | Discharge status: Home / Self Care | Payer: BC Managed Care – PPO | Attending: Internal Medicine | Admitting: Internal Medicine

## 2021-11-16 ENCOUNTER — Encounter (HOSPITAL_COMMUNITY): Payer: Self-pay

## 2021-11-16 ENCOUNTER — Telehealth: Payer: Self-pay | Admitting: Internal Medicine

## 2021-11-16 ENCOUNTER — Ambulatory Visit: Payer: BC Managed Care – PPO | Admitting: Internal Medicine

## 2021-11-16 DIAGNOSIS — R1032 Left lower quadrant pain: Secondary | ICD-10-CM | POA: Diagnosis not present

## 2021-11-16 LAB — COMPREHENSIVE METABOLIC PANEL
ALT: 16 U/L (ref 0–44)
AST: 20 U/L (ref 15–41)
Albumin: 3.8 g/dL (ref 3.5–5.0)
Alkaline Phosphatase: 32 U/L — ABNORMAL LOW (ref 38–126)
Anion gap: 11 (ref 5–15)
BUN: 15 mg/dL (ref 6–20)
CO2: 25 mmol/L (ref 22–32)
Calcium: 9.7 mg/dL (ref 8.9–10.3)
Chloride: 104 mmol/L (ref 98–111)
Creatinine, Ser: 0.85 mg/dL (ref 0.44–1.00)
GFR, Estimated: >60 mL/min (ref >60)
Glucose, Bld: 91 mg/dL (ref 70–99)
Potassium: 4.9 mmol/L (ref 3.5–5.1)
Sodium: 140 mmol/L (ref 135–145)
Total Bilirubin: 0.8 mg/dL (ref 0.3–1.2)
Total Protein: 7.2 g/dL (ref 6.5–8.1)

## 2021-11-16 LAB — POCT URINALYSIS DIPSTICK, ED / UC
Bilirubin Urine: NEGATIVE
Glucose, UA: NEGATIVE mg/dL
Hgb urine dipstick: NEGATIVE
Ketones, ur: NEGATIVE mg/dL
Leukocytes,Ua: NEGATIVE
Nitrite: NEGATIVE
Protein, ur: NEGATIVE mg/dL
Specific Gravity, Urine: 1.01 (ref 1.005–1.030)
Urobilinogen, UA: 0.2 mg/dL (ref 0.0–1.0)
pH: 7 (ref 5.0–8.0)

## 2021-11-16 LAB — CBC
HCT: 43.6 % (ref 36.0–46.0)
Hemoglobin: 14.5 g/dL (ref 12.0–15.0)
MCH: 31.4 pg (ref 26.0–34.0)
MCHC: 33.3 g/dL (ref 30.0–36.0)
MCV: 94.4 fL (ref 80.0–100.0)
Platelets: 176 10*3/uL (ref 150–400)
RBC: 4.62 MIL/uL (ref 3.87–5.11)
RDW: 12.1 % (ref 11.5–15.5)
WBC: 2.8 10*3/uL — ABNORMAL LOW (ref 4.0–10.5)
nRBC: 0 % (ref 0.0–0.2)

## 2021-11-16 LAB — POC URINE PREG, ED: Preg Test, Ur: NEGATIVE

## 2021-11-16 NOTE — ED Triage Notes (Signed)
Pt c/o LLQ pain shooting to back at times. States having some nausea. Denies constipation or taking any meds.

## 2021-11-16 NOTE — Discharge Instructions (Signed)
Your blood work is pending.  We will call if there are any abnormalities.  I highly recommend that you keep your appointment with primary care today as they may be able to schedule outpatient imaging for you.  You have also been given contact information for gastrointestinal specialist for follow-up as well.  If pain significantly worsens or if you develop any new associated symptoms including vomiting, diarrhea, blood in stool, please go to the ER for further evaluation and management.
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
## 2021-11-21 DIAGNOSIS — F40248 Other situational type phobia: Secondary | ICD-10-CM | POA: Diagnosis not present

## 2021-11-21 DIAGNOSIS — F4322 Adjustment disorder with anxiety: Secondary | ICD-10-CM | POA: Diagnosis not present

## 2021-11-28 DIAGNOSIS — M25562 Pain in left knee: Secondary | ICD-10-CM | POA: Diagnosis not present

## 2021-11-28 DIAGNOSIS — F40248 Other situational type phobia: Secondary | ICD-10-CM | POA: Diagnosis not present

## 2021-11-28 DIAGNOSIS — M25551 Pain in right hip: Secondary | ICD-10-CM | POA: Diagnosis not present

## 2021-11-28 DIAGNOSIS — M359 Systemic involvement of connective tissue, unspecified: Secondary | ICD-10-CM | POA: Diagnosis not present

## 2021-11-28 DIAGNOSIS — L930 Discoid lupus erythematosus: Secondary | ICD-10-CM | POA: Diagnosis not present

## 2021-11-28 DIAGNOSIS — F4322 Adjustment disorder with anxiety: Secondary | ICD-10-CM | POA: Diagnosis not present

## 2021-12-11 DIAGNOSIS — F40248 Other situational type phobia: Secondary | ICD-10-CM | POA: Diagnosis not present

## 2021-12-11 DIAGNOSIS — F4322 Adjustment disorder with anxiety: Secondary | ICD-10-CM | POA: Diagnosis not present

## 2021-12-27 DIAGNOSIS — F40248 Other situational type phobia: Secondary | ICD-10-CM | POA: Diagnosis not present

## 2021-12-27 DIAGNOSIS — F4322 Adjustment disorder with anxiety: Secondary | ICD-10-CM | POA: Diagnosis not present

## 2022-01-03 DIAGNOSIS — F40248 Other situational type phobia: Secondary | ICD-10-CM | POA: Diagnosis not present

## 2022-01-03 DIAGNOSIS — F4322 Adjustment disorder with anxiety: Secondary | ICD-10-CM | POA: Diagnosis not present

## 2022-01-11 DIAGNOSIS — F40248 Other situational type phobia: Secondary | ICD-10-CM | POA: Diagnosis not present

## 2022-01-11 DIAGNOSIS — F4322 Adjustment disorder with anxiety: Secondary | ICD-10-CM | POA: Diagnosis not present

## 2022-01-18 DIAGNOSIS — F4322 Adjustment disorder with anxiety: Secondary | ICD-10-CM | POA: Diagnosis not present

## 2022-01-18 DIAGNOSIS — F40248 Other situational type phobia: Secondary | ICD-10-CM | POA: Diagnosis not present

## 2022-02-13 DIAGNOSIS — G43719 Chronic migraine without aura, intractable, without status migrainosus: Secondary | ICD-10-CM | POA: Diagnosis not present

## 2022-02-28 DIAGNOSIS — M359 Systemic involvement of connective tissue, unspecified: Secondary | ICD-10-CM | POA: Diagnosis not present

## 2022-03-20 DIAGNOSIS — D2271 Melanocytic nevi of right lower limb, including hip: Secondary | ICD-10-CM | POA: Diagnosis not present

## 2022-03-20 DIAGNOSIS — D225 Melanocytic nevi of trunk: Secondary | ICD-10-CM | POA: Diagnosis not present

## 2022-03-20 DIAGNOSIS — L639 Alopecia areata, unspecified: Secondary | ICD-10-CM | POA: Diagnosis not present

## 2022-03-20 DIAGNOSIS — D2261 Melanocytic nevi of right upper limb, including shoulder: Secondary | ICD-10-CM | POA: Diagnosis not present

## 2022-03-20 DIAGNOSIS — L723 Sebaceous cyst: Secondary | ICD-10-CM | POA: Diagnosis not present

## 2022-03-20 DIAGNOSIS — D485 Neoplasm of uncertain behavior of skin: Secondary | ICD-10-CM | POA: Diagnosis not present

## 2022-04-03 DIAGNOSIS — F4322 Adjustment disorder with anxiety: Secondary | ICD-10-CM | POA: Diagnosis not present

## 2022-04-03 DIAGNOSIS — F40248 Other situational type phobia: Secondary | ICD-10-CM | POA: Diagnosis not present

## 2022-04-10 DIAGNOSIS — L639 Alopecia areata, unspecified: Secondary | ICD-10-CM | POA: Diagnosis not present

## 2022-04-10 DIAGNOSIS — D239 Other benign neoplasm of skin, unspecified: Secondary | ICD-10-CM | POA: Diagnosis not present

## 2022-05-28 DIAGNOSIS — D3121 Benign neoplasm of right retina: Secondary | ICD-10-CM | POA: Diagnosis not present

## 2022-05-29 DIAGNOSIS — Z01419 Encounter for gynecological examination (general) (routine) without abnormal findings: Secondary | ICD-10-CM | POA: Diagnosis not present

## 2022-05-29 DIAGNOSIS — Z124 Encounter for screening for malignant neoplasm of cervix: Secondary | ICD-10-CM | POA: Diagnosis not present

## 2022-05-29 DIAGNOSIS — M8588 Other specified disorders of bone density and structure, other site: Secondary | ICD-10-CM | POA: Diagnosis not present

## 2022-05-29 DIAGNOSIS — Z8262 Family history of osteoporosis: Secondary | ICD-10-CM | POA: Diagnosis not present

## 2022-05-29 DIAGNOSIS — Z6823 Body mass index (BMI) 23.0-23.9, adult: Secondary | ICD-10-CM | POA: Diagnosis not present

## 2022-05-29 DIAGNOSIS — Z1151 Encounter for screening for human papillomavirus (HPV): Secondary | ICD-10-CM | POA: Diagnosis not present

## 2022-05-31 DIAGNOSIS — L930 Discoid lupus erythematosus: Secondary | ICD-10-CM | POA: Diagnosis not present

## 2022-05-31 DIAGNOSIS — M25562 Pain in left knee: Secondary | ICD-10-CM | POA: Diagnosis not present

## 2022-05-31 DIAGNOSIS — M25551 Pain in right hip: Secondary | ICD-10-CM | POA: Diagnosis not present

## 2022-05-31 DIAGNOSIS — M359 Systemic involvement of connective tissue, unspecified: Secondary | ICD-10-CM | POA: Diagnosis not present

## 2022-06-07 DIAGNOSIS — L639 Alopecia areata, unspecified: Secondary | ICD-10-CM | POA: Diagnosis not present

## 2022-07-01 DIAGNOSIS — Z131 Encounter for screening for diabetes mellitus: Secondary | ICD-10-CM | POA: Diagnosis not present

## 2022-07-01 DIAGNOSIS — Z13228 Encounter for screening for other metabolic disorders: Secondary | ICD-10-CM | POA: Diagnosis not present

## 2022-07-01 DIAGNOSIS — Z1321 Encounter for screening for nutritional disorder: Secondary | ICD-10-CM | POA: Diagnosis not present

## 2022-07-01 DIAGNOSIS — Z1231 Encounter for screening mammogram for malignant neoplasm of breast: Secondary | ICD-10-CM | POA: Diagnosis not present

## 2022-07-01 DIAGNOSIS — Z1329 Encounter for screening for other suspected endocrine disorder: Secondary | ICD-10-CM | POA: Diagnosis not present

## 2022-07-01 DIAGNOSIS — Z13220 Encounter for screening for lipoid disorders: Secondary | ICD-10-CM | POA: Diagnosis not present

## 2022-08-07 DIAGNOSIS — L639 Alopecia areata, unspecified: Secondary | ICD-10-CM | POA: Diagnosis not present

## 2022-08-13 DIAGNOSIS — G43719 Chronic migraine without aura, intractable, without status migrainosus: Secondary | ICD-10-CM | POA: Diagnosis not present

## 2022-08-13 DIAGNOSIS — M542 Cervicalgia: Secondary | ICD-10-CM | POA: Diagnosis not present

## 2022-08-22 DIAGNOSIS — M359 Systemic involvement of connective tissue, unspecified: Secondary | ICD-10-CM | POA: Diagnosis not present

## 2022-10-02 DIAGNOSIS — L639 Alopecia areata, unspecified: Secondary | ICD-10-CM | POA: Diagnosis not present

## 2022-10-09 DIAGNOSIS — M47816 Spondylosis without myelopathy or radiculopathy, lumbar region: Secondary | ICD-10-CM | POA: Diagnosis not present

## 2022-10-09 DIAGNOSIS — M545 Low back pain, unspecified: Secondary | ICD-10-CM | POA: Diagnosis not present

## 2022-11-06 DIAGNOSIS — L03031 Cellulitis of right toe: Secondary | ICD-10-CM | POA: Diagnosis not present

## 2022-11-06 DIAGNOSIS — L03032 Cellulitis of left toe: Secondary | ICD-10-CM | POA: Diagnosis not present

## 2022-11-19 ENCOUNTER — Telehealth: Payer: BC Managed Care – PPO | Admitting: Family Medicine

## 2022-11-19 DIAGNOSIS — U071 COVID-19: Secondary | ICD-10-CM

## 2022-11-19 MED ORDER — NIRMATRELVIR/RITONAVIR (PAXLOVID)TABLET: 3.0000 | ORAL_TABLET | Freq: Two times a day (BID) | ORAL | 0 refills | Status: AC

## 2022-11-19 NOTE — Patient Instructions (Addendum)
[name], thank you for joining Freddy Finner, NP for today's virtual visit.  While this provider is not your primary care provider (PCP), if your PCP is located in our provider database this encounter information will be shared with them immediately following your visit.   A [facility] MyChart account gives you access to today's visit and all your visits, tests, and labs performed at [facility] " click here if you don't have a [facility] MyChart account or go to [email or web address]  Consent: (Patient) [name] provided verbal consent for this virtual visit at the beginning of the encounter.  Current Medications:  Current Outpatient Medications:    nirmatrelvir/ritonavir (PAXLOVID) 20 x 150 MG & 10 x 100MG  TABS, Take 3 tablets by mouth 2 (two) times daily for 5 days. (Take nirmatrelvir 150 mg two tablets twice daily for 5 days and ritonavir 100 mg one tablet twice daily for 5 days) Patient GFR is 88.09 August 2022, Disp: 30 tablet, Rfl: 0   acetaminophen (TYLENOL) 500 MG tablet, Take 1,000 mg by mouth every 6 (six) hours as needed for moderate pain or headache., Disp: , Rfl:    eletriptan (RELPAX) 40 MG tablet, Take 40 mg by mouth as needed for migraine., Disp: , Rfl:    folic acid (FOLVITE) 1 MG tablet, Take 1 mg by mouth daily., Disp: , Rfl:    imipramine (TOFRANIL) 25 MG tablet, Take 25 mg by mouth at bedtime., Disp: , Rfl:    loteprednol (LOTEMAX) 0.5 % ophthalmic suspension, Place 1 drop into both eyes daily as needed (itchy/dry eyes). (Patient not taking: Reported on 09/21/2021), Disp: , Rfl:    methotrexate 2.5 MG tablet, Take 10 mg by mouth every Monday. Caution:Chemotherapy. Protect from light. 4 pills once per week, Disp: , Rfl:    norgestrel-ethinyl estradiol (LOW-OGESTREL) 0.3-30 MG-MCG tablet, Take 1 tablet by mouth daily., Disp: , Rfl:    Medications ordered in this encounter:  Meds ordered this encounter  Medications   nirmatrelvir/ritonavir  (PAXLOVID) 20 x 150 MG & 10 x 100MG  TABS    Sig: Take 3 tablets by mouth 2 (two) times daily for 5 days. (Take nirmatrelvir 150 mg two tablets twice daily for 5 days and ritonavir 100 mg one tablet twice daily for 5 days) Patient GFR is 88.09 August 2022    Dispense:  30 tablet    Refill:  0    Order Specific Question:   Supervising Provider    Answer:   Merrilee Jansky [id number]     *If you need refills on other medications prior to your next appointment, please contact your pharmacy*  Follow-Up: Call back or seek an in-person evaluation if the symptoms worsen or if the condition fails to improve as anticipated.  [facility] [phone]  Care Instructions:  - Continue OTC symptomatic management of choice - Take prescribed medication as directed - Push fluids - Rest as needed   Isolation Instructions: You are to isolate at home until you have been fever free for at least 24 hours without a fever-reducing medication, and symptoms have been steadily improving for 24 hours. At that time,  you can end isolation but need to mask for an additional 5 days.   If you must be around other household members who do not have symptoms, you need to make sure that both you and the family members are masking consistently with a high-quality mask.  If you note any worsening of symptoms despite  treatment, please seek an in-person evaluation ASAP. If you note any significant shortness of breath or any chest pain, please seek ER evaluation. Please do not delay care!   COVID-19: What to Do if You Are Sick If you test positive and are an older adult or someone who is at high risk of getting very sick from COVID-19, treatment may be available. Contact a healthcare provider right away after a positive test to determine if you are eligible, even if your symptoms are mild right now. You can also visit a Test to Treat location and, if eligible, receive a prescription from a provider. Don't delay:  Treatment must be started within the first few days to be effective. If you have a fever, cough, or other symptoms, you might have COVID-19. Most people have mild illness and are able to recover at home. If you are sick: Keep track of your symptoms. If you have an emergency warning sign (including trouble breathing), call 911. Steps to help prevent the spread of COVID-19 if you are sick If you are sick with COVID-19 or think you might have COVID-19, follow the steps below to care for yourself and to help protect other people in your home and community. Stay home except to get medical care Stay home. Most people with COVID-19 have mild illness and can recover at home without medical care. Do not leave your home, except to get medical care. Do not visit public areas and do not go to places where you are unable to wear a mask. Take care of yourself. Get rest and stay hydrated. Take over-the-counter medicines, such as acetaminophen, to help you feel better. Stay in touch with your doctor. Call before you get medical care. Be sure to get care if you have trouble breathing, or have any other emergency warning signs, or if you think it is an emergency. Avoid public transportation, ride-sharing, or taxis if possible. Get tested If you have symptoms of COVID-19, get tested. While waiting for test results, stay away from others, including staying apart from those living in your household. Get tested as soon as possible after your symptoms start. Treatments may be available for people with COVID-19 who are at risk for becoming very sick. Don't delay: Treatment must be started early to be effective--some treatments must begin within 5 days of your first symptoms. Contact your healthcare provider right away if your test result is positive to determine if you are eligible. Self-tests are one of several options for testing for the virus that causes COVID-19 and may be more convenient than laboratory-based tests and  point-of-care tests. Ask your healthcare provider or your local health department if you need help interpreting your test results. You can visit your state, tribal, local, and territorial health department's website to look for the latest local information on testing sites. Separate yourself from other people As much as possible, stay in a specific room and away from other people and pets in your home. If possible, you should use a separate bathroom. If you need to be around other people or animals in or outside of the home, wear a well-fitting mask. Tell your close contacts that they may have been exposed to COVID-19. An infected person can spread COVID-19 starting 48 hours (or 2 days) before the person has any symptoms or tests positive. By letting your close contacts know they may have been exposed to COVID-19, you are helping to protect everyone. See COVID-19 and Animals if you have questions about pets. If  you are diagnosed with COVID-19, someone from the health department may call you. Answer the call to slow the spread. Monitor your symptoms Symptoms of COVID-19 include fever, cough, or other symptoms. Follow care instructions from your healthcare provider and local health department. Your local health authorities may give instructions on checking your symptoms and reporting information. When to seek emergency medical attention Look for emergency warning signs* for COVID-19. If someone is showing any of these signs, seek emergency medical care immediately: Trouble breathing Persistent pain or pressure in the chest New confusion Inability to wake or stay awake Pale, gray, or blue-colored skin, lips, or nail beds, depending on skin tone *This list is not all possible symptoms. Please call your medical provider for any other symptoms that are severe or concerning to you. Call 911 or call ahead to your local emergency facility: Notify the operator that you are seeking care for someone who has or  may have COVID-19. Call ahead before visiting your doctor Call ahead. Many medical visits for routine care are being postponed or done by phone or telemedicine. If you have a medical appointment that cannot be postponed, call your doctor's office, and tell them you have or may have COVID-19. This will help the office protect themselves and other patients. If you are sick, wear a well-fitting mask You should wear a mask if you must be around other people or animals, including pets (even at home). Wear a mask with the best fit, protection, and comfort for you. You don't need to wear the mask if you are alone. If you can't put on a mask (because of trouble breathing, for example), cover your coughs and sneezes in some other way. Try to stay at least 6 feet away from other people. This will help protect the people around you. Masks should not be placed on young children under age 3 years, anyone who has trouble breathing, or anyone who is not able to remove the mask without help. Cover your coughs and sneezes Cover your mouth and nose with a tissue when you cough or sneeze. Throw away used tissues in a lined trash can. Immediately wash your hands with soap and water for at least 20 seconds. If soap and water are not available, clean your hands with an alcohol-based hand sanitizer that contains at least 60% alcohol. Clean your hands often Wash your hands often with soap and water for at least 20 seconds. This is especially important after blowing your nose, coughing, or sneezing; going to the bathroom; and before eating or preparing food. Use hand sanitizer if soap and water are not available. Use an alcohol-based hand sanitizer with at least 60% alcohol, covering all surfaces of your hands and rubbing them together until they feel dry. Soap and water are the best option, especially if hands are visibly dirty. Avoid touching your eyes, nose, and mouth with unwashed hands. Handwashing Tips Avoid sharing  personal household items Do not share dishes, drinking glasses, cups, eating utensils, towels, or bedding with other people in your home. Wash these items thoroughly after using them with soap and water or put in the dishwasher. Clean surfaces in your home regularly Clean and disinfect high-touch surfaces (for example, doorknobs, tables, handles, light switches, and countertops) in your "sick room" and bathroom. In shared spaces, you should clean and disinfect surfaces and items after each use by the person who is ill. If you are sick and cannot clean, a caregiver or other person should only clean and disinfect  the area around you (such as your bedroom and bathroom) on an as needed basis. Your caregiver/other person should wait as long as possible (at least several hours) and wear a mask before entering, cleaning, and disinfecting shared spaces that you use. Clean and disinfect areas that may have blood, stool, or body fluids on them. Use household cleaners and disinfectants. Clean visible dirty surfaces with household cleaners containing soap or detergent. Then, use a household disinfectant. Use a product from [employer] List N: Disinfectants for Coronavirus (COVID-19). Be sure to follow the instructions on the label to ensure safe and effective use of the product. Many products recommend keeping the surface wet with a disinfectant for a certain period of time (look at "contact time" on the product label). You may also need to wear personal protective equipment, such as gloves, depending on the directions on the product label. Immediately after disinfecting, wash your hands with soap and water for 20 seconds. For completed guidance on cleaning and disinfecting your home, visit Complete Disinfection Guidance. Take steps to improve ventilation at home Improve ventilation (air flow) at home to help prevent from spreading COVID-19 to other people in your household. Clear out COVID-19 virus particles in the  air by opening windows, using air filters, and turning on fans in your home. Use this interactive tool to learn how to improve air flow in your home. When you can be around others after being sick with COVID-19 Deciding when you can be around others is different for different situations. Find out when you can safely end home isolation. For any additional questions about your care, contact your healthcare provider or state or local health department. 07/25/2020 Content source: [facility] ([facility]), Division of Viral Diseases This information is not intended to replace advice given to you by your health care provider. Make sure you discuss any questions you have with your health care provider. Document Revised: 09/07/2020 Document Reviewed: 09/07/2020 Elsevier Patient Education  2022 [employer].     If you have been instructed to have an in-person evaluation today at a local Urgent Care facility, please use the link below. It will take you to a list of all of our available [facility], including address, phone number and hours of operation. Please do not delay care.  [facility]  If you or a family member do not have a primary care provider, use the link below to schedule a visit and establish care. When you choose a [facility] primary care physician or advanced practice provider, you gain a long-term partner in health. Find a Primary Care Provider  Learn more about [facility]'s in-office and virtual care options: [facility] - Get Care Now

## 2022-11-19 NOTE — Progress Notes (Signed)
Virtual Visit Consent   [name], you are scheduled for a virtual visit with a [facility] provider today. Just as with appointments in the office, your consent must be obtained to participate. Your consent will be active for this visit and any virtual visit you may have with one of our providers in the next 365 days. If you have a MyChart account, a copy of this consent can be sent to you electronically.  As this is a virtual visit, video technology does not allow for your provider to perform a traditional examination. This may limit your provider's ability to fully assess your condition. If your provider identifies any concerns that need to be evaluated in person or the need to arrange testing (such as labs, EKG, etc.), we will make arrangements to do so. Although advances in technology are sophisticated, we cannot ensure that it will always work on either your end or our end. If the connection with a video visit is poor, the visit may have to be switched to a telephone visit. With either a video or telephone visit, we are not always able to ensure that we have a secure connection.  By engaging in this virtual visit, you consent to the provision of healthcare and authorize for your insurance to be billed (if applicable) for the services provided during this visit. Depending on your insurance coverage, you may receive a charge related to this service.  I need to obtain your verbal consent now. Are you willing to proceed with your visit today? Edia KAFI DOTTER has provided verbal consent on 11/19/2022 for a virtual visit (video or telephone). Freddy Finner, NP  Date: 11/19/2022 10:59 AM  Virtual Visit via Video Note   I, Freddy Finner, connected with  [name]  ([id number], [birth year]) on 11/19/22 at 11:00 AM EDT by a video-enabled telemedicine application and verified that I am speaking with the correct person using two identifiers.  Location: Patient: Virtual Visit Location Patient:  Home Provider: Virtual Visit Location Provider: Home Office   I discussed the limitations of evaluation and management by telemedicine and the availability of in person appointments. The patient expressed understanding and agreed to proceed.    History of Present Illness: [place] [name] is a 47 y.o. who identifies as a female who was assigned female at birth, and is being seen today for COVID  Onset was Sunday night- with feeling poorly and congestion Associated symptoms are fever 103- improved now to 99, chills, headache- sinus infection feeling, mild cough-dry Modifying factors are tylenol and normal medications Methotrexate yesterday- spoke with Rheumatologist Dr Dierdre Forth who advised to hold until better.  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   Epidermoid cyst of skin 05/19/2018   Hyperbilirubinemia 07/30/2017   Routine general medical examination at a health care facility 04/07/2017   Ulnar neuritis, right 11/23/2016   Migraine 09/25/2016   Von Willebrand disease (HCC) 11/03/2010   Atrial fibrillation (HCC) 06/15/2008    Allergies:  Allergies  Allergen  Reactions   Avocado Nausea And Vomiting   Pineapple Hives   Sesame Oil Hives   Almond Oil    Cardamom [Elettaria Cardamomum Minuscula]    Compazine [Prochlorperazine] Other (See Comments)    "lock jaw"   Imitrex [Sumatriptan]     SOB   Other Hives   Prochlorperazine Edisylate Other (See Comments)    Patient states that the reaction to the compazine is  lockjaw.   Sulfonamide Derivatives Hives   Medications:  Current Outpatient Medications:    acetaminophen (TYLENOL) 500 MG tablet, Take 1,000 mg by mouth every 6 (six) hours as needed for moderate pain or headache., Disp: , Rfl:    eletriptan (RELPAX) 40 MG tablet, Take 40 mg by mouth as needed for migraine., Disp: , Rfl:    folic acid (FOLVITE) 1 MG tablet, Take 1 mg by mouth daily., Disp: , Rfl:    imipramine (TOFRANIL) 25 MG tablet, Take 25 mg by mouth at bedtime., Disp: , Rfl:    loteprednol (LOTEMAX) 0.5 % ophthalmic suspension, Place 1 drop into both eyes daily as needed (itchy/dry eyes). (Patient not taking: Reported on 09/21/2021), Disp: , Rfl:    methotrexate 2.5 MG tablet, Take 10 mg by mouth every Monday. Caution:Chemotherapy. Protect from light. 4 pills once per week, Disp: , Rfl:    norgestrel-ethinyl estradiol (LOW-OGESTREL) 0.3-30 MG-MCG tablet, Take 1 tablet by mouth daily., Disp: , Rfl:   Observations/Objective: Patient is well-developed, well-nourished in no acute distress.  Resting comfortably  at home.  Head is normocephalic, atraumatic.  No labored breathing.  Speech is clear and coherent with logical content.  Patient is alert and oriented at baseline.  Congestion  Assessment and Plan:  1. COVID-19 - nirmatrelvir/ritonavir (PAXLOVID) 20 x 150 MG & 10 x 100MG  TABS; Take 3 tablets by mouth 2 (two) times daily for 5 days. (Take nirmatrelvir 150 mg two tablets twice daily for 5 days and ritonavir 100 mg one tablet twice daily for 5 days) Patient GFR is 88.09 August 2022  Dispense: 30 tablet; Refill: 0  - Continue OTC symptomatic management of choice - Info for COVID sent on AVS as well - Take prescribed medications as directed - Push fluids - Rest as needed - Discussed return precautions and when to seek in-person evaluation, sent via AVS as well  Reviewed side effects, risks and benefits of medication.    Patient acknowledged agreement and understanding of the  plan.   Past Medical, Surgical, Social History, Allergies, and Medications have been Reviewed.   Follow Up Instructions: I discussed the assessment and treatment plan with the patient. The patient was provided an opportunity to ask questions and all were answered. The patient agreed with the plan and demonstrated an understanding of the instructions.  A copy of instructions were sent to the patient via MyChart unless otherwise noted below.    The patient was advised to call back or seek an in-person evaluation if the symptoms worsen or if the condition fails to improve as anticipated.  Time:  I spent 19 minutes with the patient via telehealth technology discussing the above problems/concerns.    Freddy Finner, NP

## 2022-12-03 DIAGNOSIS — L03031 Cellulitis of right toe: Secondary | ICD-10-CM | POA: Diagnosis not present

## 2022-12-30 ENCOUNTER — Encounter: Payer: Self-pay | Admitting: Family Medicine

## 2022-12-30 ENCOUNTER — Ambulatory Visit: Payer: BC Managed Care – PPO | Admitting: Family Medicine

## 2022-12-30 VITALS — BP 104/60 | HR 72 | Temp 98.3°F | Resp 20 | Ht 61.0 in | Wt 132.0 lb

## 2022-12-30 DIAGNOSIS — M940 Chondrocostal junction syndrome [Tietze]: Secondary | ICD-10-CM | POA: Diagnosis not present

## 2022-12-30 MED ORDER — PREDNISONE 10 MG (21) PO TBPK: ORAL_TABLET | ORAL | 0 refills | Status: DC

## 2022-12-30 NOTE — Progress Notes (Signed)
Assessment & Plan:  1. Acute costochondritis Education provided on costochondritis. - predniSONE (STERAPRED UNI-PAK 21 TAB) 10 MG (21) TBPK tablet; Use as directed.  Dispense: 21 each; Refill: 0  No results found for any visits on 12/30/22.  Follow up plan: Return if symptoms worsen or fail to improve.  Deliah Boston, MSN, APRN, FNP-C  Subjective:  HPI: [name] is a 47 y.o. female presenting on 12/30/2022 for Cough (Cough with congestion and chest tightness. Had COVID a month ago and is still having issues )  Patient complains of a shooting/stabbing pain in the midsternal/left chest that started three weeks after she had COVID last month. It occurs daily with deep breaths, coughing, and exercise. It lasts as long as the activity that brought it on.   ROS: Negative unless specifically indicated above in HPI.   Relevant past medical history reviewed and updated as indicated.   Allergies and medications reviewed and updated.   Current Outpatient Medications:    acetaminophen (TYLENOL) 500 MG tablet, Take 1,000 mg by mouth every 6 (six) hours as needed for moderate pain or headache., Disp: , Rfl:    eletriptan (RELPAX) 40 MG tablet, Take 40 mg by mouth as needed for migraine., Disp: , Rfl:    folic acid (FOLVITE) 1 MG tablet, Take 1 mg by mouth daily., Disp: , Rfl:    imipramine (TOFRANIL) 25 MG tablet, Take 25 mg by mouth at bedtime., Disp: , Rfl:    methotrexate 2.5 MG tablet, Take 10 mg by mouth every Monday. Caution:Chemotherapy. Protect from light. 4 pills once per week, Disp: , Rfl:    norgestrel-ethinyl estradiol (LOW-OGESTREL) 0.3-30 MG-MCG tablet, Take 1 tablet by mouth daily., Disp: , Rfl:    loteprednol (LOTEMAX) 0.5 % ophthalmic suspension, Place 1 drop into both eyes daily as needed (itchy/dry eyes). (Patient not taking: Reported on 09/21/2021), Disp: , Rfl:   Allergies  Allergen Reactions   Compazine [Prochlorperazine] Other (See Comments)    "lock jaw"    Imitrex [Sumatriptan]     SOB   Prochlorperazine Edisylate Other (See Comments)    Patient states that the reaction to the compazine is lockjaw.   Sulfonamide Derivatives Hives    Objective:   BP 104/60   Pulse 72   Temp 98.3 F (36.8 C)   Resp 20   Ht 5\' 1"  (1.549 m)   Wt 132 lb (59.9 kg)   LMP 12/17/2022 (Exact Date)   SpO2 98%   BMI 24.94 kg/m    Physical Exam Vitals reviewed.  Constitutional:      General: She is not in acute distress.    Appearance: Normal appearance. She is not ill-appearing, toxic-appearing or diaphoretic.  HENT:     Head: Normocephalic and atraumatic.  Eyes:     General: No scleral icterus.       Right eye: No discharge.        Left eye: No discharge.     Conjunctiva/sclera: Conjunctivae normal.  Cardiovascular:     Rate and Rhythm: Normal rate and regular rhythm.     Heart sounds: Normal heart sounds. No murmur heard.    No friction rub. No gallop.  Pulmonary:     Effort: Pulmonary effort is normal. No respiratory distress.     Breath sounds: Normal breath sounds. No stridor. No wheezing, rhonchi or rales.  Musculoskeletal:        General: Normal range of motion.     Cervical back: Normal range of motion.  Comments: Pain in muscle between ribs on left side.  Skin:    General: Skin is warm and dry.     Capillary Refill: Capillary refill takes less than 2 seconds.  Neurological:     General: No focal deficit present.     Mental Status: She is alert and oriented to person, place, and time. Mental status is at baseline.  Psychiatric:        Mood and Affect: Mood normal.        Behavior: Behavior normal.        Thought Content: Thought content normal.        Judgment: Judgment normal.

## 2023-01-10 DIAGNOSIS — L639 Alopecia areata, unspecified: Secondary | ICD-10-CM | POA: Diagnosis not present

## 2023-01-19 DIAGNOSIS — N3001 Acute cystitis with hematuria: Secondary | ICD-10-CM | POA: Diagnosis not present

## 2023-01-19 DIAGNOSIS — R300 Dysuria: Secondary | ICD-10-CM | POA: Diagnosis not present

## 2023-01-27 DIAGNOSIS — L930 Discoid lupus erythematosus: Secondary | ICD-10-CM | POA: Diagnosis not present

## 2023-01-27 DIAGNOSIS — M25551 Pain in right hip: Secondary | ICD-10-CM | POA: Diagnosis not present

## 2023-01-27 DIAGNOSIS — M359 Systemic involvement of connective tissue, unspecified: Secondary | ICD-10-CM | POA: Diagnosis not present

## 2023-01-27 DIAGNOSIS — M25562 Pain in left knee: Secondary | ICD-10-CM | POA: Diagnosis not present

## 2023-01-28 ENCOUNTER — Encounter: Payer: Self-pay | Admitting: Gastroenterology

## 2023-02-12 DIAGNOSIS — G43719 Chronic migraine without aura, intractable, without status migrainosus: Secondary | ICD-10-CM | POA: Diagnosis not present

## 2023-02-15 DIAGNOSIS — R519 Headache, unspecified: Secondary | ICD-10-CM | POA: Diagnosis not present

## 2023-02-15 DIAGNOSIS — H02055 Trichiasis without entropian left lower eyelid: Secondary | ICD-10-CM | POA: Diagnosis not present

## 2023-03-24 ENCOUNTER — Other Ambulatory Visit (HOSPITAL_BASED_OUTPATIENT_CLINIC_OR_DEPARTMENT_OTHER): Payer: Self-pay

## 2023-03-24 MED ORDER — FLULAVAL 0.5 ML IM SUSY
PREFILLED_SYRINGE | INTRAMUSCULAR | 0 refills | Status: DC
  Filled 2023-03-24: qty 0.5, 1d supply, fill #0

## 2023-03-25 ENCOUNTER — Other Ambulatory Visit (HOSPITAL_BASED_OUTPATIENT_CLINIC_OR_DEPARTMENT_OTHER): Payer: Self-pay

## 2023-03-26 DIAGNOSIS — L723 Sebaceous cyst: Secondary | ICD-10-CM | POA: Diagnosis not present

## 2023-03-26 DIAGNOSIS — D225 Melanocytic nevi of trunk: Secondary | ICD-10-CM | POA: Diagnosis not present

## 2023-03-26 DIAGNOSIS — L578 Other skin changes due to chronic exposure to nonionizing radiation: Secondary | ICD-10-CM | POA: Diagnosis not present

## 2023-03-26 DIAGNOSIS — D2271 Melanocytic nevi of right lower limb, including hip: Secondary | ICD-10-CM | POA: Diagnosis not present

## 2023-03-28 ENCOUNTER — Other Ambulatory Visit (HOSPITAL_BASED_OUTPATIENT_CLINIC_OR_DEPARTMENT_OTHER): Payer: Self-pay

## 2023-03-28 MED ORDER — COVID-19 MRNA VAC-TRIS(PFIZER) 30 MCG/0.3ML IM SUSY
0.3000 mL | PREFILLED_SYRINGE | Freq: Once | INTRAMUSCULAR | 0 refills | Status: AC
  Filled 2023-03-28: qty 0.3, 1d supply, fill #0

## 2023-05-13 ENCOUNTER — Encounter: Payer: Self-pay | Admitting: Gastroenterology

## 2023-05-13 ENCOUNTER — Ambulatory Visit: Payer: BC Managed Care – PPO | Admitting: Gastroenterology

## 2023-05-13 VITALS — BP 116/68 | HR 80 | Ht 61.0 in | Wt 137.0 lb

## 2023-05-13 DIAGNOSIS — D6800 Von Willebrand disease, unspecified: Secondary | ICD-10-CM

## 2023-05-13 DIAGNOSIS — G43809 Other migraine, not intractable, without status migrainosus: Secondary | ICD-10-CM

## 2023-05-13 DIAGNOSIS — G43909 Migraine, unspecified, not intractable, without status migrainosus: Secondary | ICD-10-CM

## 2023-05-13 DIAGNOSIS — Z1211 Encounter for screening for malignant neoplasm of colon: Secondary | ICD-10-CM

## 2023-05-13 MED ORDER — ONDANSETRON HCL 4 MG PO TABS: 4.0000 mg | ORAL_TABLET | ORAL | 0 refills | Status: AC | PRN

## 2023-05-13 MED ORDER — NA SULFATE-K SULFATE-MG SULF 17.5-3.13-1.6 GM/177ML PO SOLN: 1.0000 | Freq: Once | ORAL | 0 refills | Status: AC

## 2023-05-13 NOTE — Patient Instructions (Signed)
 _______________________________________________________  If your blood pressure at your visit was 140/90 or greater, please contact your primary care physician to follow up on this.  _______________________________________________________  If you are age 46 or older, your body mass index should be between 23-30. Your Body mass index is 25.89 kg/m. If this is out of the aforementioned range listed, please consider follow up with your Primary Care Provider.  If you are age 71 or younger, your body mass index should be between 19-25. Your Body mass index is 25.89 kg/m. If this is out of the aformentioned range listed, please consider follow up with your Primary Care Provider.   ________________________________________________________  The [facility] providers would like to encourage you to use MYCHART to communicate with providers for non-urgent requests or questions.  Due to long hold times on the telephone, sending your provider a message by [facility] may be a faster and more efficient way to get a response.  Please allow 48 business hours for a response.  Please remember that this is for non-urgent requests.  _______________________________________________________  We have sent the following medications to your pharmacy for you to pick up at your convenience: Suprep  You have been scheduled for a colonoscopy. Please follow written instructions given to you at your visit today.   Please pick up your prep supplies at the pharmacy within the next 1-3 days.  If you use inhalers (even only as needed), please bring them with you on the day of your procedure.  DO NOT TAKE 7 DAYS PRIOR TO TEST- Trulicity (dulaglutide) Ozempic, Wegovy (semaglutide) Mounjaro (tirzepatide) Bydureon Bcise (exanatide extended release)  DO NOT TAKE 1 DAY PRIOR TO YOUR TEST Rybelsus (semaglutide) Adlyxin (lixisenatide) Victoza (liraglutide) Byetta  (exanatide) ___________________________________________________________________________  It was a pleasure to see you today!  Thank you for trusting me with your gastrointestinal care!

## 2023-05-13 NOTE — Progress Notes (Signed)
 HPI : [name] is a 48 y.o. female who is referred to us  by Joshua Debby CROME, MD for initial average risk colon cancer screening. The patient has no family history of colon cancer. She denies any chronic lower GI symptoms such as abdominal pain, constipation, diarrhea or blood in the stool.  The patient reports being diagnosed with von Willebrand's disease many years ago.  She states that she was having symptoms of nosebleeds and easy bruising, which led to the testing and subsequent diagnosis. The patient states that she has never had any bleeding complications related to her many surgeries.  She has never been told she needs to receive any medications or infusions prior to any surgeries. She tells me that her current doctor thinks that she may have been misdiagnosed, and she is being referred to another hematologist for consideration of repeat testing for von Willebrand's.  She expresses concerns about experiencing migraines during the bowel prep process.  She states that she will always get a migraine if she is not able to eat.  Her migraines responded very well to her triptan, and she wanted to make sure that she would be able to take this during the bowel prep process.    Past Medical History:  Diagnosis Date   Broken leg    BROKEN LEG AND HAND AFTER ACCIDENT   GERD (gastroesophageal reflux disease)    Hx of atrial fibrillation, no current medication    PAT with pregnancy only-with last 2 pregnancy and currrent-using labetalol  prn   MVP (mitral valve prolapse)    MILD WITH NORMAL LV FX ON ECHO 4/08   Upper respiratory infection current   finished Z Pak, new prescription for Augmentum to start today 03/04/11   Von Willebrand disease (HCC)    Hematologist- [facility]-pt states mild and gone during pregnancy     Past Surgical History:  Procedure Laterality Date   CESAREAN SECTION     3   CESAREAN SECTION  03/08/2011   HAND SURGERY  2003   HERNIA REPAIR      repair fracture leg  2003   TONSILLECTOMY     teenager   Family History  Problem Relation Age of Onset   Hypertension Father        HAD CATH THAT WAS NORMAL   Other Mother        PALPITATION   Cancer Paternal Grandmother        thryoid   Social History   Tobacco Use   Smoking status: Never   Smokeless tobacco: Never  Vaping Use   Vaping status: Never Used  Substance Use Topics   Alcohol use: Yes    Alcohol/week: 1.0 - 2.0 standard drink of alcohol    Types: 1 - 2 Cans of beer per week    Comment: week   Drug use: No   Current Outpatient Medications  Medication Sig Dispense Refill   acetaminophen  (TYLENOL ) 500 MG tablet Take 1,000 mg by mouth every 6 (six) hours as needed for moderate pain or headache.     eletriptan (RELPAX) 40 MG tablet Take 40 mg by mouth as needed for migraine.     folic acid (FOLVITE) 1 MG tablet Take 1 mg by mouth daily.     imipramine (TOFRANIL) 25 MG tablet Take 25 mg by mouth at bedtime.     influenza vac split trivalent PF (FLULAVAL ) 0.5 ML injection Inject into the muscle. 0.5 mL 0   loteprednol (LOTEMAX) 0.5 %  ophthalmic suspension Place 1 drop into both eyes daily as needed (itchy/dry eyes).     methotrexate 2.5 MG tablet Take 10 mg by mouth every Monday. Caution:Chemotherapy. Protect from light. 4 pills once per week     norgestrel-ethinyl estradiol (LOW-OGESTREL) 0.3-30 MG-MCG tablet Take 1 tablet by mouth daily.     No current facility-administered medications for this visit.   Allergies  Allergen Reactions   Compazine [Prochlorperazine] Other (See Comments)    lock jaw   Imitrex [Sumatriptan]     SOB   Prochlorperazine Edisylate Other (See Comments)    Patient states that the reaction to the compazine is lockjaw.   Sulfonamide Derivatives Hives     Review of Systems: All systems reviewed and negative except where noted in HPI.    No results found.  Physical Exam: BP 116/68   Pulse 80   Ht 5' 1 (1.549 m)   Wt 137 lb  (62.1 kg)   BMI 25.89 kg/m  Constitutional: Pleasant,well-developed, Caucasian female in no acute distress. HEENT: Normocephalic and atraumatic. Conjunctivae are normal. No scleral icterus. Neck supple.  Cardiovascular: Normal rate, regular rhythm.  Pulmonary/chest: Effort normal and breath sounds normal. No wheezing, rales or rhonchi. Abdominal: Soft, nondistended, nontender. Bowel sounds active throughout. There are no masses palpable. No hepatomegaly. Extremities: no edema Lymphadenopathy: No cervical adenopathy noted. Neurological: Alert and oriented to person place and time. Skin: Skin is warm and dry. No rashes noted. Psychiatric: Normal mood and affect. Behavior is normal.  CBC    Component Value Date/Time   WBC 2.8 (L) 11/16/2021 0946   RBC 4.62 11/16/2021 0946   HGB 14.5 11/16/2021 0946   HGB 13.7 09/11/2011 0948   HCT 43.6 11/16/2021 0946   HCT 40.5 09/11/2011 0948   PLT 176 11/16/2021 0946   PLT 163 09/11/2011 0948   MCV 94.4 11/16/2021 0946   MCV 87.3 09/11/2011 0948   MCH 31.4 11/16/2021 0946   MCHC 33.3 11/16/2021 0946   RDW 12.1 11/16/2021 0946   RDW 13.0 09/11/2011 0948   LYMPHSABS 0.9 08/11/2017 1111   LYMPHSABS 2.3 09/11/2011 0948   MONOABS 0.2 08/11/2017 1111   MONOABS 0.4 09/11/2011 0948   EOSABS 0.0 08/11/2017 1111   EOSABS 0.1 09/11/2011 0948   BASOSABS 0.0 08/11/2017 1111   BASOSABS 0.0 09/11/2011 0948    CMP     Component Value Date/Time   NA 140 11/16/2021 0946   K 4.9 11/16/2021 0946   CL 104 11/16/2021 0946   CO2 25 11/16/2021 0946   GLUCOSE 91 11/16/2021 0946   BUN 15 11/16/2021 0946   CREATININE 0.85 11/16/2021 0946   CALCIUM 9.7 11/16/2021 0946   PROT 7.2 11/16/2021 0946   ALBUMIN 3.8 11/16/2021 0946   AST 20 11/16/2021 0946   ALT 16 11/16/2021 0946   ALKPHOS 32 (L) 11/16/2021 0946   BILITOT 0.8 11/16/2021 0946   GFRNONAA >60 11/16/2021 0946   GFRAA >60 07/01/2019 1842       Latest Ref Rng & Units 11/16/2021    9:46 AM  10/27/2020    8:14 PM 07/01/2019    6:42 PM  CBC EXTENDED  WBC 4.0 - 10.5 K/uL 2.8  5.4  6.0   RBC 3.87 - 5.11 MIL/uL 4.62  4.32  4.49   Hemoglobin 12.0 - 15.0 g/dL 85.4  86.1  86.5   HCT 36.0 - 46.0 % 43.6  41.9  41.7   Platelets 150 - 400 K/uL 176  189  202  ASSESSMENT AND PLAN:  48 year old female due for initial average risk colon cancer screening.  Has a questionable diagnosis of von Willebrand's disease, but no history of clinically significant bleeding or bleeding complications from previous surgeries.  Colon Cancer Screening Average risk for colon cancer with no family history or concerning gastrointestinal symptoms. Discussed screening options: colonoscopy, stool-based tests (FIT and Cologuard), virtual colonoscopy, and a newly approved blood test. Patient expressed concerns about migraines during bowel prep and potential bleeding due to von Willebrand's disease. Given her surgical history without bleeding complications, the risk is considered low.  Patient would like to proceed with a colonoscopy over a stool based test. - Schedule colonoscopy - Allow use of Relpax (eletriptan) for migraines during prep - Ensure patient has a driver for the day of the procedure  Von Willebrand's Disease Diagnosed due to frequent epistaxis and easy bruising. No bleeding complications during multiple past surgeries. Scheduled for retesting in March to confirm diagnosis. Bleeding risk with colonoscopy is essentially zero if no polyps are found and still very low if small polyps are removed.  If large polyps need to be removed, then postprocedural bleeding would be a potential problem and von Willebrand's.  However, given her history of no bleeding complications, I question whether she truly has this diagnosis. - Proceed with colonoscopy with awareness of von Willebrand's disease - Monitor for any bleeding complications during and after the procedure  Migraine Experiences migraines, particularly  when fasting, and is concerned about migraines during bowel prep for colonoscopy. Managed with daily imipramine and Relpax (eletriptan) as needed. Relpax can be taken during bowel prep. - Allow use of Relpax (eletriptan) during bowel prep - Continue daily imipramine for migraine prevention -Will prescribe Zofran  to help with any nausea during bowel prep.      Joshua Debby CROME, MD

## 2023-05-20 ENCOUNTER — Encounter: Payer: Self-pay | Admitting: Gastroenterology

## 2023-05-20 ENCOUNTER — Ambulatory Visit (AMBULATORY_SURGERY_CENTER): Payer: BC Managed Care – PPO | Admitting: Gastroenterology

## 2023-05-20 VITALS — BP 112/78 | HR 79 | Temp 98.2°F | Resp 14 | Ht 61.0 in | Wt 137.0 lb

## 2023-05-20 DIAGNOSIS — Z1211 Encounter for screening for malignant neoplasm of colon: Secondary | ICD-10-CM | POA: Diagnosis present

## 2023-05-20 MED ORDER — SODIUM CHLORIDE 0.9 % IV SOLN: 500.0000 mL | Freq: Once | INTRAVENOUS | Status: DC

## 2023-05-20 NOTE — Progress Notes (Signed)
 History and Physical Interval Note:  05/20/2023 10:37 AM  [name]  has presented today for endoscopic procedure(s), with the diagnosis of  Encounter Diagnosis  Name Primary?   Colon cancer screening Yes  .  The various methods of evaluation and treatment have been discussed with the patient and/or family. After consideration of risks, benefits and other options for treatment, the patient has consented to  the endoscopic procedure(s).   The patient's history has been reviewed, patient examined, no change in status, stable for endoscopic procedure(s).  I have reviewed the patient's chart and labs.  Questions were answered to the patient's satisfaction.     Brysin Towery E. Stacia, MD [facility]

## 2023-05-20 NOTE — Progress Notes (Signed)
 Pt's states no medical or surgical changes since previsit or office visit.

## 2023-05-20 NOTE — Patient Instructions (Signed)
 Resume previous diet and medications. Repeat Colonoscopy in 10 years for surveillance purposes.  YOU HAD AN ENDOSCOPIC PROCEDURE TODAY AT THE [facility]:   Refer to the procedure report that was given to you for any specific questions about what was found during the examination.  If the procedure report does not answer your questions, please call your gastroenterologist to clarify.  If you requested that your care partner not be given the details of your procedure findings, then the procedure report has been included in a sealed envelope for you to review at your convenience later.  YOU SHOULD EXPECT: Some feelings of bloating in the abdomen. Passage of more gas than usual.  Walking can help get rid of the air that was put into your GI tract during the procedure and reduce the bloating. If you had a lower endoscopy (such as a colonoscopy or flexible sigmoidoscopy) you may notice spotting of blood in your stool or on the toilet paper. If you underwent a bowel prep for your procedure, you may not have a normal bowel movement for a few days.  Please Note:  You might notice some irritation and congestion in your nose or some drainage.  This is from the oxygen used during your procedure.  There is no need for concern and it should clear up in a day or so.  SYMPTOMS TO REPORT IMMEDIATELY:  Following lower endoscopy (colonoscopy or flexible sigmoidoscopy):  Excessive amounts of blood in the stool  Significant tenderness or worsening of abdominal pains  Swelling of the abdomen that is new, acute  Fever of 100F or higher   For urgent or emergent issues, a gastroenterologist can be reached at any hour by calling [phone]. Do not use MyChart messaging for urgent concerns.    DIET:  We do recommend a small meal at first, but then you may proceed to your regular diet.  Drink plenty of fluids but you should avoid alcoholic beverages for 24 hours.  ACTIVITY:  You should plan to take it  easy for the rest of today and you should NOT DRIVE or use heavy machinery until tomorrow (because of the sedation medicines used during the test).    FOLLOW UP: Our staff will call the number listed on your records the next business day following your procedure.  We will call around 7:15- 8:00 am to check on you and address any questions or concerns that you may have regarding the information given to you following your procedure. If we do not reach you, we will leave a message.     If any biopsies were taken you will be contacted by phone or by letter within the next 1-3 weeks.  Please call us at [phone] if you have not heard about the biopsies in 3 weeks.    SIGNATURES/CONFIDENTIALITY: You and/or your care partner have signed paperwork which will be entered into your electronic medical record.  These signatures attest to the fact that that the information above on your After Visit Summary has been reviewed and is understood.  Full responsibility of the confidentiality of this discharge information lies with you and/or your care-partner.

## 2023-05-20 NOTE — Op Note (Signed)
 [facility] Patient Name: [name] Procedure Date: 05/20/2023 10:27 AM MRN: [id number] Endoscopist: Glendia E. Stacia , MD, [id number] Age: 48 Referring MD:  Date of Birth: 05/26/1975 Gender: Female Account #: [id number] Procedure:                Colonoscopy Indications:              Screening for colorectal malignant neoplasm, This                            is the patient's first colonoscopy Medicines:                Monitored Anesthesia Care Procedure:                Pre-Anesthesia Assessment:                           - Prior to the procedure, a History and Physical                            was performed, and patient medications and                            allergies were reviewed. The patient's tolerance of                            previous anesthesia was also reviewed. The risks                            and benefits of the procedure and the sedation                            options and risks were discussed with the patient.                            All questions were answered, and informed consent                            was obtained. Prior Anticoagulants: The patient has                            taken no anticoagulant or antiplatelet agents. ASA                            Grade Assessment: II - A patient with mild systemic                            disease. After reviewing the risks and benefits,                            the patient was deemed in satisfactory condition to                            undergo the procedure.  After obtaining informed consent, the colonoscope                            was passed under direct vision. Throughout the                            procedure, the patient's blood pressure, pulse, and                            oxygen saturations were monitored continuously. The                            Olympus Scope SN: [id number] was introduced through                            the anus and  advanced to the the terminal ileum,                            with identification of the appendiceal orifice and                            IC valve. The colonoscopy was performed without                            difficulty. The patient tolerated the procedure                            well. The quality of the bowel preparation was                            adequate. There was copious small debris which                            clogged the suction channel, but adequate                            cleansing/visualization was achieved. The terminal                            ileum, ileocecal valve, appendiceal orifice, and                            rectum were photographed. The bowel preparation                            used was SUPREP via split dose instruction. Scope In: 10:47:42 AM Scope Out: 11:13:57 AM Scope Withdrawal Time: 0 hours 17 minutes 41 seconds  Total Procedure Duration: 0 hours 26 minutes 15 seconds  Findings:                 The perianal and digital rectal examinations were                            normal. Pertinent negatives include normal  sphincter tone and no palpable rectal lesions.                           The colon (entire examined portion) appeared normal.                           The terminal ileum appeared normal.                           The retroflexed view of the distal rectum and anal                            verge was normal and showed no anal or rectal                            abnormalities. Complications:            No immediate complications. Estimated Blood Loss:     Estimated blood loss: none. Impression:               - The entire examined colon is normal.                           - The examined portion of the ileum was normal.                           - The distal rectum and anal verge are normal on                            retroflexion view.                           - No specimens  collected. Recommendation:           - Patient has a contact number available for                            emergencies. The signs and symptoms of potential                            delayed complications were discussed with the                            patient. Return to normal activities tomorrow.                            Written discharge instructions were provided to the                            patient.                           - Resume previous diet.                           - Continue present medications.                           -  Await pathology results.                           - Repeat colonoscopy in 10 years for screening                            purposes. Lillyana Majette E. Stacia, MD 05/20/2023 11:18:04 AM This report has been signed electronically.

## 2023-05-20 NOTE — Progress Notes (Signed)
Sedate, gd SR's, VSS, report to RN 

## 2023-05-21 ENCOUNTER — Telehealth: Payer: Self-pay | Admitting: *Deleted

## 2023-05-21 NOTE — Telephone Encounter (Signed)
  Follow up Call-     05/20/2023   10:19 AM  Call back number  Post procedure Call Back phone  # [phone]  Permission to leave phone message Yes     Patient questions:  Do you have a fever, pain , or abdominal swelling? No. Pain Score  0 *  Have you tolerated food without any problems? Yes.    Have you been able to return to your normal activities? Yes.    Do you have any questions about your discharge instructions: Diet   No. Medications  No. Follow up visit  No.  Do you have questions or concerns about your Care? No.  Actions: * If pain score is 4 or above: No action needed, pain <4.

## 2024-01-27 ENCOUNTER — Other Ambulatory Visit (HOSPITAL_BASED_OUTPATIENT_CLINIC_OR_DEPARTMENT_OTHER): Payer: Self-pay

## 2024-01-27 MED ORDER — FLUZONE 0.5 ML IM SUSY
0.5000 mL | PREFILLED_SYRINGE | Freq: Once | INTRAMUSCULAR | 0 refills | Status: AC
  Filled 2024-01-27: qty 0.5, 1d supply, fill #0

## 2024-05-24 ENCOUNTER — Other Ambulatory Visit: Payer: Self-pay | Admitting: Internal Medicine
